# Patient Record
Sex: Male | Born: 1960 | Race: White | Hispanic: No | Marital: Married | Smoking: Former smoker
Health system: Southern US, Community
[De-identification: ages and names within clinical notes are randomized; demographics above are authoritative.]

## PROBLEM LIST (undated history)

## (undated) DIAGNOSIS — N179 Acute kidney failure, unspecified: Secondary | ICD-10-CM

## (undated) DIAGNOSIS — J449 Chronic obstructive pulmonary disease, unspecified: Secondary | ICD-10-CM

## (undated) DIAGNOSIS — F1910 Other psychoactive substance abuse, uncomplicated: Secondary | ICD-10-CM

## (undated) DIAGNOSIS — F419 Anxiety disorder, unspecified: Secondary | ICD-10-CM

## (undated) DIAGNOSIS — E785 Hyperlipidemia, unspecified: Secondary | ICD-10-CM

## (undated) DIAGNOSIS — M1990 Unspecified osteoarthritis, unspecified site: Secondary | ICD-10-CM

## (undated) DIAGNOSIS — F329 Major depressive disorder, single episode, unspecified: Secondary | ICD-10-CM

## (undated) DIAGNOSIS — F32A Depression, unspecified: Secondary | ICD-10-CM

## (undated) DIAGNOSIS — I10 Essential (primary) hypertension: Secondary | ICD-10-CM

## (undated) DIAGNOSIS — R52 Pain, unspecified: Secondary | ICD-10-CM

## (undated) HISTORY — DX: Hyperlipidemia, unspecified: E78.5

## (undated) HISTORY — PX: BACK SURGERY: SHX140

## (undated) HISTORY — PX: FRACTURE SURGERY: SHX138

## (undated) HISTORY — PX: SPINE SURGERY: SHX786

## (undated) HISTORY — PX: CERVICAL FUSION: SHX112

## (undated) HISTORY — PX: OTHER SURGICAL HISTORY: SHX169

## (undated) HISTORY — DX: Other psychoactive substance abuse, uncomplicated: F19.10

---

## 2007-04-08 ENCOUNTER — Encounter: Admission: RE | Admit: 2007-04-08 | Discharge: 2007-04-08 | Payer: Self-pay | Admitting: Family Medicine

## 2007-06-04 ENCOUNTER — Inpatient Hospital Stay (HOSPITAL_COMMUNITY): Admission: RE | Admit: 2007-06-04 | Discharge: 2007-06-05 | Payer: Self-pay | Admitting: Specialist

## 2007-10-19 ENCOUNTER — Encounter: Admission: RE | Admit: 2007-10-19 | Discharge: 2007-10-19 | Payer: Self-pay | Admitting: Family Medicine

## 2007-11-10 ENCOUNTER — Ambulatory Visit: Payer: Self-pay | Admitting: Internal Medicine

## 2007-11-10 DIAGNOSIS — J984 Other disorders of lung: Secondary | ICD-10-CM

## 2007-11-10 DIAGNOSIS — R079 Chest pain, unspecified: Secondary | ICD-10-CM

## 2007-12-13 ENCOUNTER — Encounter: Admission: RE | Admit: 2007-12-13 | Discharge: 2007-12-13 | Payer: Self-pay | Admitting: Specialist

## 2008-01-07 ENCOUNTER — Inpatient Hospital Stay (HOSPITAL_COMMUNITY): Admission: AD | Admit: 2008-01-07 | Discharge: 2008-01-08 | Payer: Self-pay | Admitting: Specialist

## 2008-03-16 ENCOUNTER — Ambulatory Visit: Payer: Self-pay | Admitting: *Deleted

## 2008-03-16 ENCOUNTER — Inpatient Hospital Stay (HOSPITAL_COMMUNITY): Admission: AD | Admit: 2008-03-16 | Discharge: 2008-03-19 | Payer: Self-pay | Admitting: *Deleted

## 2008-04-04 ENCOUNTER — Telehealth (INDEPENDENT_AMBULATORY_CARE_PROVIDER_SITE_OTHER): Payer: Self-pay | Admitting: *Deleted

## 2008-04-12 ENCOUNTER — Encounter (INDEPENDENT_AMBULATORY_CARE_PROVIDER_SITE_OTHER): Payer: Self-pay | Admitting: *Deleted

## 2008-04-12 ENCOUNTER — Encounter: Payer: Self-pay | Admitting: Internal Medicine

## 2008-04-24 ENCOUNTER — Ambulatory Visit: Payer: Self-pay | Admitting: Cardiology

## 2008-04-25 ENCOUNTER — Telehealth (INDEPENDENT_AMBULATORY_CARE_PROVIDER_SITE_OTHER): Payer: Self-pay | Admitting: *Deleted

## 2008-05-01 ENCOUNTER — Ambulatory Visit (HOSPITAL_COMMUNITY): Admission: RE | Admit: 2008-05-01 | Discharge: 2008-05-02 | Payer: Self-pay | Admitting: Neurological Surgery

## 2008-05-30 ENCOUNTER — Encounter: Admission: RE | Admit: 2008-05-30 | Discharge: 2008-05-30 | Payer: Self-pay | Admitting: Neurological Surgery

## 2008-07-24 ENCOUNTER — Encounter: Admission: RE | Admit: 2008-07-24 | Discharge: 2008-07-24 | Payer: Self-pay | Admitting: Neurological Surgery

## 2008-10-23 ENCOUNTER — Encounter: Admission: RE | Admit: 2008-10-23 | Discharge: 2008-10-23 | Payer: Self-pay | Admitting: Neurological Surgery

## 2009-04-04 ENCOUNTER — Telehealth (INDEPENDENT_AMBULATORY_CARE_PROVIDER_SITE_OTHER): Payer: Self-pay | Admitting: *Deleted

## 2009-04-09 ENCOUNTER — Encounter: Payer: Self-pay | Admitting: Internal Medicine

## 2010-03-18 ENCOUNTER — Ambulatory Visit: Payer: Self-pay | Admitting: Internal Medicine
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
## 2010-09-17 NOTE — Op Note (Signed)
Adrian Rivas, Adrian Rivas                ACCOUNT NO.:  [id number]   MEDICAL RECORD NO.:  [id number]          PATIENT TYPE:  OIB   LOCATION:  3006                         FACILITY:  [facility]   PHYSICIAN:  Tia Alert, MD     DATE OF BIRTH:  [birth year]   DATE OF PROCEDURE:  05/01/2008  DATE OF DISCHARGE:                               OPERATIVE REPORT   PREOPERATIVE DIAGNOSIS:  Cervical spondylosis C3-4 and C6-7 with neck  pain.   POSTOPERATIVE DIAGNOSIS:  Cervical spondylosis C3-4 and C6-7 with neck  pain.   PROCEDURES:  1. Decompressive anterior cervical diskectomy C3-4 and C6-7 through      separate incisions.  2. Anterior cervical arthrodesis C3-4 and C6-7 utilizing 7-mm      corticocancellous allograft.  3. Anterior cervical plating C3-4 and C6-7 utilizing 24-mm Atlantis      Venture plates both through separate incisions.   SURGEON:  Tia Alert, MD   ASSISTANT:  Reinaldo Meeker, MD   ANESTHESIA:  General general endotracheal.   COMPLICATIONS:  None apparent.   INDICATIONS FOR PROCEDURE:  Adrian Rivas is a very pleasant 50 year old  gentleman who presented with severe axial neck pain.  He had an  interscapular pain and occasional aching down the arms.  He had an MRI  and then a CT myelogram, which showed cervical spondylosis with disk  bulging and osteophytes at C3-4, C6-7.  We felt that it was reasonable  this was the cause of her axial neck pain and interscapular pain.  He  had tried medical management quite some time without significant relief  including epidural steroid injection as I recommended  __________ at C3-  4 and C6-7.  He understood the risks, benefits, expected outcome and  wished to proceed.  He understood that this should be done through  separate incisions.   DESCRIPTION OF PROCEDURE:  The patient was taken to operating room and  after induction of adequate generalized endotracheal anesthesia, he was  placed in the supine position on the operating room  table.  His right  anterior cervical region was prepped with DuraPrep and then draped in  the usual sterile fashion.  Local anesthesia 5 mL was injected, and we  started on C3-4, made a transverse incision on the C3-4 and then  dissected a plane medial to the sternocleidomastoid muscle and internal  carotid artery and lateral __________ esophagus to expose C3-4.  Intraoperative fluoroscopy confirmed my level and then we took down the  longus colli muscles and the shadow line retractor was placed under this  to expose C3-4.  The annulus was then incised and the initial diskectomy  was done with pituitary rongeurs and curved curettes __________ anterior  inferior endplate of C3 and then used the high-speed drill to drill the  endplates down to the level of the posterior longitudinal ligament.  In  the posterior spurs, there was a large posterior spur coming off to the  posterior body of C3.  This was all removed with 1 and a 2-mm Kerrison  punch while undercutting the vertebral bodies to decompress the  central  canal.  The posterior longitudinal ligament was opened and removed, and  as we undercut the bodies.  We dissected until we could see the takeoff  of the C4 nerve roots.  Then the dura was full all the way across.  We  palpated in a circumferential fashion with a nerve hook such that we  felt we had a good decompression both visually and with palpation.  We  then irrigated, we measured interspace to be a 7 mm to use a 7 mL  corticocancellous allograft and tapped this into position at C3-4.  We  then used a 24-mm Atlantis Venture plate placed.  Two 30-mm variable  angle screws into the bodies of C3-C4, and these locked in the plate by  locking mechanism within the plate.  We then irrigated this, left this  opened and went to C6-7, made a transverse incision and then opened the  platysma, then dissected plane medial to the sternocleidomastoid mastoid  muscle, internal carotid artery, and  extended laterally to  __________esophagus to expose C6-7.  Intraoperative fluoroscopy  confirmed our level and then we took down the longus colli muscles.  There was a large anterior osteophyte on the anterior part of C6-7.  This was removed with a Leksell rongeur and then the retractor was  placed.  The annulus was incised and the initial diskectomy was done  with pituitary rongeurs and curved curettes.  I then used the high-speed  drill to drill the endplates down to the level of the posterior  longitudinal ligament, which was opened with a nerve hook, and then  removed in a circumferential fashion while undercutting the bodies of C6-  C7.  The pedicles were palpable.  After marching along the C7 endplate  bilaterally and saw the takeoff of C7 nerve roots, decompressed the  nerve roots and decompressing the central canal.  Once we had this done,  the dura was full all the way across.  We palpated with a nerve hook in  a circumferential fashion to assure adequate decompression of both nerve  hooks and a central canal.  I then measured the interspace to be 7 mm.  I irrigated with saline solution containing bacitracin and used a 7-mm  corticocancellous allograft and tapped  it into position at C6-7.  I  then used another 24-mm plate with four 30-mm variable angle screws, and  he is again locked into the plate by the locking mechanism within the  plate.  I then irrigated both wounds with copious amounts of bacitracin  containing saline solution, dried all bleeding points with bipolar  cautery, and once the meticulous hemostasis was achieved in both wounds,  closed the platysma with 3-0 Vicryl, close the subcuticular tissue with  3-0 Vicryl, and closed the skin with Benzoin Steri-Strips.  The drapes  were removed.  Sterile dressing was applied.  The patient was awakened  from general anesthesia and transferred to recovery room in stable  condition.  At the end of the procedure all sponges,  needle, and  instrument counts were correct.      Tia Alert, MD  Electronically Signed     DSJ/MEDQ  D:  05/01/2008  T:  05/02/2008  Job:  [id number]

## 2010-09-17 NOTE — Op Note (Signed)
Adrian Rivas, Adrian Rivas                ACCOUNT NO.:  [id number]   MEDICAL RECORD NO.:  [id number]          PATIENT TYPE:  INP   LOCATION:  1605                         FACILITY:  [facility]   PHYSICIAN:  Jene Every, M.D.    DATE OF BIRTH:  May 23, 1960   DATE OF PROCEDURE:  06/03/2007  DATE OF DISCHARGE:  06/05/2007                               OPERATIVE REPORT   REDICTATION   PREOPERATIVE DIAGNOSES:  1. Spinal stenosis.  2. Herniated nucleus pulposus at L3-4, L4-5.   POSTOPERATIVE DIAGNOSIS:  1. Spinal stenosis.  2. Herniated nucleus pulposus at L3-4, L4-5.   PROCEDURES PERFORMED:  1. Decompression at L4-5 as well as L3-4.  2. Central laminectomy of L3.  3. Bilateral hemilaminotomies of L4-5.  4. Foraminotomies of L4 and L5 bilaterally.   ANESTHESIA:  General.   ASSISTANT:  Georges Lynch. Gioffre, M.D.   BRIEF HISTORY AND INDICATIONS:  This a 50 year old with right lower  extremity radiculopathy secondary to a disk herniation migrating  cephalad from L4-5, as well as chronic spinal stenosis, and multilevel  disk degeneration.  He was indicated for decompression due to acute L4-5  radiculopathy from his HNP with underlying stenosis.  It was indicated  that this would not effect his back pain, that may require fusion in the  future, as the acute incident is this disk herniation with neural  compression.  He had a positive neurotension sign.  He had weakness of  his quad and his EHL.  The risks and benefits were discussed including  bleeding, infection, damage to surrounding structures,  CSF leakage,  epidural fibrosis, adjacent segment disease, the need for fusion in the  future, anesthetic complications etc.   TECHNIQUE:  The patient was placed in the supination position after  having been inducted under general anesthesia and 2 grams Kefzol.  He  was placed prone on the [place] frame, and all bony prominences were  well padded.  Lumbar region prepped and draped in the usual  fashion.  A  2-inch spinal needle was utilized and localize the L4-5 interspace,  confirmed with x-ray.  Incision was made from the spinous process of L3  to below L5.  The subcutaneous tissue was dissected down and  electrocautery was used to achieve hemostasis.  Dorsolumbar fascia  identified, bilateral skin incision paraspinous muscle elevated from the  lamina of L3-4 and L4-5.  Confirmatory radiograph obtained with a  McCullough retractor and Penfields identifying the spinous process of L4  and the interlaminar space of L4-5 and L3-4.   Next a Leksell rongeur was utilized to remove the spinous process of L4  and partial of L5.  Due to the severe central spinal stenosis, we  elected to proceed centrally.  Following this, we performed  hemilaminotomies of caudad edge of L4 utilizing a two, followed by a 3  mm Kerrison.  This out laterally and then carrying the decompression  centrally.  We removed the entire lamina of L4 as there was severe  stenosis noted.  Ligamentum flavum hypertrophy and facet hypertrophy was  noted.   Following the removal of the  lamina of L4 there was hypertrophic  ligamentum at L3-4,  ligamentum flavum was removed to the caudad edge of  L3 bilaterally.  We used the operating microscope.  We protected the  neural elements. decompressed the lateral recesses to the medial border  of the pedicle of L4.  A large disk herniation paracentral to the right  was identified.  It was gently mobilized with the nerve hook, and then  retrieved with the micropituitary.  This decompressed the L4 and L5 root  on the right.  Following this, we examined the disk space at L4-5.  There was no residual disk herniation emanating from the disk space  itself.  There was some stenosis into the foramen of L4 and L5  bilaterally.  We performed foraminotomies bilaterally.  Bipolar  electrocautery was utilized to achieve hemostasis.   We removed the ligamentum flavum as well from L4-5, and  performed  hemilaminotomies of the cephalad edge of L5 bilaterally and  foraminotomies of L5.  Following this a hockey stick probe passed freely  through the foramens of L4 and L5, and cephalad to the pedicle of L3.  The thecal sac was reconstituted. It was pulsatile.  There is no CSF  leakage or active bleeding.  Disk space was checked on the right.  There  was no disk herniation and no residual disk herniation in the foramen  underneath the axilla of the root.  There was at least 1 cm of excursion  of the L4 and L5 roots medial to the pedicle without difficulty.   Next, wound copiously irrigated.  Bone wax placed in the cancellous  surfaces.  Thrombin-soaked Gelfoam was placed in laminotomy defect.  McCullough retractors removed.  Paraspinous muscle inspected with no  active bleeding.  Copious irrigation was performed.  I repaired the  dorsolumbar fascia with #1 Vicryl interrupted figure-of-eight sutures.  The subcutaneous tissue reapproximated with 2-0 Vicryl simple sutures.  The skin was reapproximated with staples.  The wound was dressed  sterilely.  He was placed supine on the hospital bed, extubated without  difficulty, and transported to recovery in satisfactory condition.   The patient tolerated the procedure well with no complications.   BLOOD LOSS:  100 mL.      Jene Every, M.D.  Electronically Signed     JB/MEDQ  D:  06/16/2007  T:  06/17/2007  Job:  [id number]
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
## 2011-01-24 LAB — COMPREHENSIVE METABOLIC PANEL
ALT: 31
Albumin: 3.7
Calcium: 9.2
Glucose, Bld: 112 — ABNORMAL HIGH
Sodium: 139
Total Protein: 6.2

## 2011-01-24 LAB — CBC
MCHC: 34.5
MCV: 94.4
RBC: 4.41
RDW: 14.4

## 2011-02-04 LAB — COMPREHENSIVE METABOLIC PANEL
BUN: 13
CO2: 29
Calcium: 9.2
Chloride: 106
Creatinine, Ser: 0.91
GFR calc non Af Amer: >60
Total Bilirubin: 0.7

## 2011-02-04 LAB — URINE DRUGS OF ABUSE SCREEN W ALC, ROUTINE (REF LAB)
Barbiturate Quant, Ur: NEGATIVE
Benzodiazepines.: NEGATIVE
Methadone: NEGATIVE
Phencyclidine (PCP): NEGATIVE
Propoxyphene: NEGATIVE

## 2011-02-04 LAB — URINALYSIS, ROUTINE W REFLEX MICROSCOPIC
Bilirubin Urine: NEGATIVE
Glucose, UA: NEGATIVE
Hgb urine dipstick: NEGATIVE
Nitrite: NEGATIVE
Specific Gravity, Urine: 1.02
pH: 6

## 2011-02-04 LAB — CBC
HCT: 43.8
Hemoglobin: 14.9
MCV: 95.9
RDW: 12.6
WBC: 5.7

## 2011-02-04 LAB — AMPHETAMINES URINE CONFIRMATION: Amphetamines: NEGATIVE

## 2011-02-04 LAB — TSH: TSH: 1.307

## 2011-02-05 LAB — COMPREHENSIVE METABOLIC PANEL
Alkaline Phosphatase: 74
BUN: 7
Glucose, Bld: 101 — ABNORMAL HIGH
Potassium: 4
Total Bilirubin: 1.5 — ABNORMAL HIGH
Total Protein: 6.6

## 2011-02-05 LAB — URINALYSIS, ROUTINE W REFLEX MICROSCOPIC
Bilirubin Urine: NEGATIVE
Glucose, UA: NEGATIVE
Hgb urine dipstick: NEGATIVE
Ketones, ur: NEGATIVE
Protein, ur: NEGATIVE

## 2011-02-05 LAB — CBC
HCT: 39.8
Hemoglobin: 15.1
MCHC: 33.6
MCV: 96.2
MCV: 97.4
Platelets: 198
RBC: 4.14 — ABNORMAL LOW
RBC: 4.62
WBC: 8.5
WBC: 8.7

## 2011-02-05 LAB — BASIC METABOLIC PANEL
BUN: 3 — ABNORMAL LOW
CO2: 30
Calcium: 8.5
Chloride: 106
Creatinine, Ser: 0.8
GFR calc Af Amer: >60
Glucose, Bld: 120 — ABNORMAL HIGH
Potassium: 3.9

## 2011-02-05 LAB — PROTIME-INR: Prothrombin Time: 11.9

## 2011-02-07 LAB — DIFFERENTIAL
Basophils Relative: 0 % (ref 0–1)
Eosinophils Absolute: 0.1 10*3/uL (ref 0.0–0.7)
Eosinophils Relative: 1 % (ref 0–5)
Monocytes Relative: 10 % (ref 3–12)
Neutrophils Relative %: 79 % — ABNORMAL HIGH (ref 43–77)

## 2011-02-07 LAB — CBC
MCHC: 32.8 g/dL (ref 30.0–36.0)
MCV: 95.7 fL (ref 78.0–100.0)
Platelets: 316 10*3/uL (ref 150–400)
RBC: 4.5 MIL/uL (ref 4.22–5.81)

## 2011-02-07 LAB — PROTIME-INR: INR: 0.9 (ref 0.00–1.49)

## 2011-02-07 LAB — BASIC METABOLIC PANEL
BUN: 11 mg/dL (ref 6–23)
CO2: 29 mEq/L (ref 19–32)
Chloride: 106 mEq/L (ref 96–112)
Creatinine, Ser: 0.87 mg/dL (ref 0.4–1.5)

## 2012-10-11 ENCOUNTER — Other Ambulatory Visit: Payer: Self-pay | Admitting: Orthopedic Surgery

## 2012-10-11 NOTE — Pre-Procedure Instructions (Signed)
Please enter orders in EPIC as patient has pre-op appointment on 10/13/2012 at 2 pm! Thank you!

## 2012-10-13 ENCOUNTER — Encounter (HOSPITAL_COMMUNITY)
Admission: RE | Admit: 2012-10-13 | Discharge: 2012-10-13 | Disposition: A | Discharge status: Home / Self Care | Payer: BC Managed Care – PPO | Source: Ambulatory Visit | Attending: Specialist | Admitting: Specialist

## 2012-10-13 ENCOUNTER — Ambulatory Visit (HOSPITAL_COMMUNITY)
Admission: RE | Admit: 2012-10-13 | Discharge: 2012-10-13 | Disposition: A | Discharge status: Home / Self Care | Payer: BC Managed Care – PPO | Source: Ambulatory Visit | Attending: Orthopedic Surgery | Admitting: Orthopedic Surgery

## 2012-10-13 ENCOUNTER — Encounter (HOSPITAL_COMMUNITY): Payer: Self-pay

## 2012-10-13 ENCOUNTER — Encounter (HOSPITAL_COMMUNITY): Payer: Self-pay | Admitting: Pharmacy Technician

## 2012-10-13 ENCOUNTER — Other Ambulatory Visit: Payer: Self-pay | Admitting: Orthopedic Surgery

## 2012-10-13 DIAGNOSIS — Z981 Arthrodesis status: Secondary | ICD-10-CM | POA: Insufficient documentation

## 2012-10-13 DIAGNOSIS — M51379 Other intervertebral disc degeneration, lumbosacral region without mention of lumbar back pain or lower extremity pain: Secondary | ICD-10-CM | POA: Insufficient documentation

## 2012-10-13 DIAGNOSIS — M5137 Other intervertebral disc degeneration, lumbosacral region: Secondary | ICD-10-CM | POA: Insufficient documentation

## 2012-10-13 DIAGNOSIS — M5380 Other specified dorsopathies, site unspecified: Secondary | ICD-10-CM | POA: Insufficient documentation

## 2012-10-13 DIAGNOSIS — Z01818 Encounter for other preprocedural examination: Secondary | ICD-10-CM | POA: Insufficient documentation

## 2012-10-13 DIAGNOSIS — Z01810 Encounter for preprocedural cardiovascular examination: Secondary | ICD-10-CM | POA: Insufficient documentation

## 2012-10-13 DIAGNOSIS — Z01812 Encounter for preprocedural laboratory examination: Secondary | ICD-10-CM | POA: Insufficient documentation

## 2012-10-13 DIAGNOSIS — M4120 Other idiopathic scoliosis, site unspecified: Secondary | ICD-10-CM | POA: Insufficient documentation

## 2012-10-13 DIAGNOSIS — J4489 Other specified chronic obstructive pulmonary disease: Secondary | ICD-10-CM | POA: Insufficient documentation

## 2012-10-13 DIAGNOSIS — J449 Chronic obstructive pulmonary disease, unspecified: Secondary | ICD-10-CM | POA: Insufficient documentation

## 2012-10-13 HISTORY — DX: Hyperlipidemia, unspecified: E78.5

## 2012-10-13 HISTORY — DX: Major depressive disorder, single episode, unspecified: F32.9

## 2012-10-13 HISTORY — DX: Anxiety disorder, unspecified: F41.9

## 2012-10-13 HISTORY — DX: Pain, unspecified: R52

## 2012-10-13 HISTORY — DX: Chronic obstructive pulmonary disease, unspecified: J44.9

## 2012-10-13 HISTORY — DX: Unspecified osteoarthritis, unspecified site: M19.90

## 2012-10-13 HISTORY — DX: Depression, unspecified: F32.A

## 2012-10-13 NOTE — Pre-Procedure Instructions (Signed)
PT GIVES HX OF COPD, OBESITY, DEPRESSION - NO RECENT EKG OR CXR - EKG AND CXR WERE DONE TODAY PREOP AT [facility] - ALONG WITH BACK XRAY THAT WAS ORDERED BY DR. Shelle Iron. PT HAD CBC, DIFF, CMET, THYROID PANEL, VIT D LEVEL - DONE 10/06/2012 AND RPORTS FAXED BY DR. NIEMEYER'S OFFICE AND ON PT'S CHART.  NO OTHER PREOP BLOOD WORK IS NEEDED.

## 2012-10-13 NOTE — Progress Notes (Signed)
10/13/12 1453  OBSTRUCTIVE SLEEP APNEA  Have you ever been diagnosed with sleep apnea through a sleep study? No  Do you snore loudly (loud enough to be heard through closed doors)?  1  Do you often feel tired, fatigued, or sleepy during the daytime? 0  Has anyone observed you stop breathing during your sleep? 1  Do you have, or are you being treated for high blood pressure? 0  BMI more than 35 kg/m2? 1  Age over 54 years old? 1  Neck circumference greater than 40 cm/18 inches? 0  Gender: 1  Obstructive Sleep Apnea Score 5  Score 4 or greater  Results sent to PCP

## 2012-10-13 NOTE — Patient Instructions (Signed)
YOUR SURGERY IS SCHEDULED AT [facility]  ON:   Wednesday  June 18  REPORT TO [place] [facility] AT:  6:30 AM      PHONE # FOR SHORT STAY IS [phone]     STOP PHENTERMINE AT LEAST 3 DAYS BEFORE YOUR SURGERY -- ROUTINE REQUEST BY ANESTHESIOLOGIST.  DO NOT EAT OR DRINK ANYTHING AFTER MIDNIGHT THE NIGHT BEFORE YOUR SURGERY.  YOU MAY BRUSH YOUR TEETH, RINSE OUT YOUR MOUTH--BUT NO WATER, NO FOOD, NO CHEWING GUM, NO MINTS, NO CANDIES, NO CHEWING TOBACCO.  PLEASE TAKE THE FOLLOWING MEDICATIONS THE AM OF YOUR SURGERY WITH A FEW SIPS OF WATER:  PRISTIQ, LAMICTAL, HYDROCODONE / ACETAMINOPHEN    DO NOT BRING VALUABLES, MONEY, CREDIT CARDS.  DO NOT WEAR JEWELRY, MAKE-UP, NAIL POLISH AND NO METAL PINS OR CLIPS IN YOUR HAIR. CONTACT LENS, DENTURES / PARTIALS, GLASSES SHOULD NOT BE WORN TO SURGERY AND IN MOST CASES-HEARING AIDS WILL NEED TO BE REMOVED.  BRING YOUR GLASSES CASE, ANY EQUIPMENT NEEDED FOR YOUR CONTACT LENS. FOR PATIENTS ADMITTED TO THE HOSPITAL--CHECK OUT TIME THE DAY OF DISCHARGE IS 11:00 AM.  ALL INPATIENT ROOMS ARE PRIVATE - WITH BATHROOM, TELEPHONE, TELEVISION AND WIFI INTERNET.                                 PLEASE READ OVER ANY  FACT SHEETS THAT YOU WERE GIVEN: MRSA INFORMATION, BLOOD TRANSFUSION INFORMATION, INCENTIVE SPIROMETER INFORMATION. FAILURE TO FOLLOW THESE INSTRUCTIONS MAY RESULT IN THE CANCELLATION OF YOUR SURGERY.   PATIENT SIGNATURE_________________________________

## 2012-10-13 NOTE — H&P (Signed)
Adrian Rivas is an 52 y.o. male.   Chief Complaint: back and B/L leg pain HPI: The patient is a 52 year old male who presents with 4 months of gradual onset LBP (left worse than right) and pain, numbness, stiffness, tightness and weakness in the left thigh, left lateral lower leg, right thigh and right lateral lower leg. The patient describes the pain as sharp. The patient describes the severity of their symptoms as severe and worsening. Symptoms are exacerbated by standing. Past treatment has included epidural injections (no significant relief).  Ronne Binning follows up and he has had over four months of pain into his legs and recently over the past month inability to walk without a walker. He was seen by Dr. Ethelene Hal. He had an MRI performed of his lumbar spine. He has severe stenosis above the level of his decompression. Stenosis is now at L2-3 into L1-2 with disc herniations noted. No deformity of the conus. He reports pain into the anterior thighs. Difficulty walking. No fevers, chills, or change in bowel or bladder function. He has tried an epidural without relief. He has been doing his exercises without relief.  Hx prior lumbar decompression L3-4 and L4-5 by Dr. Shelle Iron in 05/2007.   No past medical history on file.  No past surgical history on file.  No family history on file. Social History:  has no tobacco, alcohol, and drug history on file.  Allergies: No Known Allergies   (Not in a hospital admission)  No results found for this or any previous visit (from the past 48 hour(s)). No results found.  Review of Systems  Constitutional: Negative.   HENT: Negative.   Eyes: Negative.   Respiratory: Negative.   Cardiovascular: Negative.   Gastrointestinal: Negative.   Genitourinary: Negative.   Musculoskeletal: Positive for back pain.  Skin: Negative.   Neurological: Positive for sensory change and focal weakness.  Endo/Heme/Allergies: Negative.   Psychiatric/Behavioral:  Negative.     There were no vitals taken for this visit. Physical Exam  Constitutional: He is oriented to person, place, and time. He appears well-developed and well-nourished. He appears distressed.  HENT:  Head: Normocephalic.  Eyes: Conjunctivae and EOM are normal. Pupils are equal, round, and reactive to light.  Neck: Normal range of motion. Neck supple.  Cardiovascular: Normal rate and regular rhythm.   Respiratory: Effort normal and breath sounds normal.  GI: Soft. Bowel sounds are normal.  Musculoskeletal:  On exam he is sitting on a rolling wheelchair. He has some slight hip flexor weakness as well as quad weakness. When he stands he walks with a forward flexed gait. On straight standing he has pain into the anterior lateral aspect of both thighs. Peripheral pulses are intact. No DVT. He has reduced extension secondary to pain, relieved with forward flexion. Nontender over the thoracic spine. Pelvis is stable. No flank pain with percussion. No instability in the hips, knees, and ankles.   Neurological: He is alert and oriented to person, place, and time.  Skin: Skin is warm and dry.  Psychiatric: He has a normal mood and affect.    MRI Lspine with right sided central extrusion L1-2 displacing L1 root; severe stenosis L2-3; B/L foraminal narrowing L3-4, L4-5, L5-S1.  Three view radiographs demonstrate no instability in the adjacent segments. Previous decompression at 3-4 and 4-5.  Assessment/Plan Stenosis L1-2, L2-3  Severe spinal stenosis at 2-3 extending into 1-2, history of lumbar decompression at 3-4 and 4-5. The patient with significant neurogenic claudication requiring  a walker, weakness in his hip flexors and quads despite rest, activity modifications, and injections.  I had an extensive discussion of the risks and benefits of lumbar decompression with the patient including bleeding, infection, damage to neurovascular structures, epidural fibrosis, CSF leakage  requiring repair. We also discussed increase in pain, adjacent segment disease, recurrent disc herniation, need for future surgery including repeat decompression and/or fusion. We also discussed risks of postoperative hematoma, paralysis, anesthetic complications including DVT, PE, death, cardiopulmonary dysfunction. In addition, the perioperative and postoperative courses were discussed in detail including the rehabilitative time and return to functional activity and work. I provided the patient with an illustrated handout and utilized the appropriate surgical models.  In the interim continue use of his walker, pain medicine, strategies to avoid reinjury. Preoperative clearance will be appreciated by his medical physician. Appreciate the kind referral.  Plan microlumbar decompression L1-2, L2-3, possible L3-4  Shequita Peplinski M. for Dr. Shelle Iron 10/13/2012, 12:42 PM

## 2012-10-20 ENCOUNTER — Encounter (HOSPITAL_COMMUNITY): Payer: Self-pay

## 2012-10-20 ENCOUNTER — Encounter (HOSPITAL_COMMUNITY)
Admission: RE | Disposition: A | Discharge status: Home / Self Care | Payer: Self-pay | Source: Ambulatory Visit | Attending: Specialist

## 2012-10-20 ENCOUNTER — Inpatient Hospital Stay (HOSPITAL_COMMUNITY)
Admission: RE | Admit: 2012-10-20 | Discharge: 2012-10-22 | DRG: 757 | Disposition: A | Discharge status: Home / Self Care | Payer: BC Managed Care – PPO | Source: Ambulatory Visit | Attending: Specialist | Admitting: Specialist

## 2012-10-20 ENCOUNTER — Inpatient Hospital Stay (HOSPITAL_COMMUNITY): Payer: BC Managed Care – PPO

## 2012-10-20 ENCOUNTER — Inpatient Hospital Stay (HOSPITAL_COMMUNITY): Payer: BC Managed Care – PPO | Admitting: Anesthesiology

## 2012-10-20 ENCOUNTER — Encounter (HOSPITAL_COMMUNITY): Payer: Self-pay | Admitting: Anesthesiology

## 2012-10-20 DIAGNOSIS — M5126 Other intervertebral disc displacement, lumbar region: Secondary | ICD-10-CM | POA: Diagnosis present

## 2012-10-20 DIAGNOSIS — E785 Hyperlipidemia, unspecified: Secondary | ICD-10-CM | POA: Diagnosis present

## 2012-10-20 DIAGNOSIS — F329 Major depressive disorder, single episode, unspecified: Secondary | ICD-10-CM | POA: Diagnosis present

## 2012-10-20 DIAGNOSIS — J449 Chronic obstructive pulmonary disease, unspecified: Secondary | ICD-10-CM | POA: Diagnosis present

## 2012-10-20 DIAGNOSIS — F3289 Other specified depressive episodes: Secondary | ICD-10-CM | POA: Diagnosis present

## 2012-10-20 DIAGNOSIS — M48061 Spinal stenosis, lumbar region without neurogenic claudication: Secondary | ICD-10-CM

## 2012-10-20 DIAGNOSIS — Z79899 Other long term (current) drug therapy: Secondary | ICD-10-CM

## 2012-10-20 DIAGNOSIS — M48062 Spinal stenosis, lumbar region with neurogenic claudication: Principal | ICD-10-CM | POA: Diagnosis present

## 2012-10-20 DIAGNOSIS — F411 Generalized anxiety disorder: Secondary | ICD-10-CM | POA: Diagnosis present

## 2012-10-20 DIAGNOSIS — J4489 Other specified chronic obstructive pulmonary disease: Secondary | ICD-10-CM | POA: Diagnosis present

## 2012-10-20 HISTORY — PX: LUMBAR LAMINECTOMY/DECOMPRESSION MICRODISCECTOMY: SHX5026

## 2012-10-20 SURGERY — LUMBAR LAMINECTOMY/DECOMPRESSION MICRODISCECTOMY 3 LEVELS
Anesthesia: General | Site: Back | Wound class: Clean

## 2012-10-20 MED ORDER — SODIUM CHLORIDE 0.45 % IV SOLN
INTRAVENOUS | Status: DC
  Administered 2012-10-20 (×2): via INTRAVENOUS

## 2012-10-20 MED ORDER — METHOCARBAMOL 500 MG PO TABS: 500.0000 mg | ORAL_TABLET | Freq: Four times a day (QID) | ORAL | Status: DC

## 2012-10-20 MED ORDER — CEFAZOLIN SODIUM-DEXTROSE 2-3 GM-% IV SOLR
2.0000 g | Freq: Three times a day (TID) | INTRAVENOUS | Status: AC
  Administered 2012-10-20 (×2): 2 g via INTRAVENOUS
  Filled 2012-10-20 (×2): qty 50

## 2012-10-20 MED ORDER — HYDROMORPHONE HCL PF 1 MG/ML IJ SOLN
INTRAMUSCULAR | Status: DC | PRN
  Administered 2012-10-20 (×2): 0.5 mg via INTRAVENOUS
  Administered 2012-10-20: 1 mg via INTRAVENOUS

## 2012-10-20 MED ORDER — BUPIVACAINE-EPINEPHRINE 0.5% -1:200000 IJ SOLN
INTRAMUSCULAR | Status: DC | PRN
  Administered 2012-10-20: 10 mL

## 2012-10-20 MED ORDER — LIDOCAINE HCL (CARDIAC) 20 MG/ML IV SOLN
INTRAVENOUS | Status: DC | PRN
  Administered 2012-10-20: 80 mg via INTRAVENOUS

## 2012-10-20 MED ORDER — MAGNESIUM CITRATE PO SOLN: 1.0000 | Freq: Once | ORAL | Status: AC | PRN

## 2012-10-20 MED ORDER — GLYCOPYRROLATE 0.2 MG/ML IJ SOLN
INTRAMUSCULAR | Status: DC | PRN
  Administered 2012-10-20: 0.6 mg via INTRAVENOUS

## 2012-10-20 MED ORDER — PROPOFOL 10 MG/ML IV BOLUS
INTRAVENOUS | Status: DC | PRN
  Administered 2012-10-20: 200 mg via INTRAVENOUS

## 2012-10-20 MED ORDER — PHENTERMINE HCL 37.5 MG PO TABS: 37.5000 mg | ORAL_TABLET | Freq: Every day | ORAL | Status: DC

## 2012-10-20 MED ORDER — SODIUM CHLORIDE 0.9 % IJ SOLN: 3.0000 mL | INTRAMUSCULAR | Status: DC | PRN

## 2012-10-20 MED ORDER — ARIPIPRAZOLE 10 MG PO TABS
10.0000 mg | ORAL_TABLET | Freq: Every day | ORAL | Status: DC
Start: 2012-10-20 — End: 2012-10-22
  Administered 2012-10-20 – 2012-10-21 (×2): 10 mg via ORAL
  Filled 2012-10-20 (×3): qty 1

## 2012-10-20 MED ORDER — HYDROMORPHONE HCL PF 1 MG/ML IJ SOLN
0.2500 mg | INTRAMUSCULAR | Status: DC | PRN
  Administered 2012-10-20: 0.5 mg via INTRAVENOUS

## 2012-10-20 MED ORDER — NEOSTIGMINE METHYLSULFATE 1 MG/ML IJ SOLN
INTRAMUSCULAR | Status: DC | PRN
  Administered 2012-10-20: 4 mg via INTRAVENOUS

## 2012-10-20 MED ORDER — ACETAMINOPHEN 650 MG RE SUPP: 650.0000 mg | RECTAL | Status: DC | PRN

## 2012-10-20 MED ORDER — ACETAMINOPHEN 10 MG/ML IV SOLN
INTRAVENOUS | Status: DC | PRN
  Administered 2012-10-20: 1000 mg via INTRAVENOUS

## 2012-10-20 MED ORDER — DOCUSATE SODIUM 100 MG PO CAPS
100.0000 mg | ORAL_CAPSULE | Freq: Two times a day (BID) | ORAL | Status: DC
  Administered 2012-10-20 – 2012-10-22 (×4): 100 mg via ORAL

## 2012-10-20 MED ORDER — THROMBIN 5000 UNITS EX SOLR
CUTANEOUS | Status: DC | PRN
  Administered 2012-10-20: 10000 [IU] via TOPICAL

## 2012-10-20 MED ORDER — FENTANYL CITRATE 0.05 MG/ML IJ SOLN
INTRAMUSCULAR | Status: DC | PRN
  Administered 2012-10-20: 50 ug via INTRAVENOUS
  Administered 2012-10-20 (×2): 100 ug via INTRAVENOUS

## 2012-10-20 MED ORDER — METHOCARBAMOL 500 MG PO TABS
500.0000 mg | ORAL_TABLET | Freq: Four times a day (QID) | ORAL | Status: DC | PRN
  Administered 2012-10-20 – 2012-10-22 (×4): 500 mg via ORAL
  Filled 2012-10-20 (×4): qty 1

## 2012-10-20 MED ORDER — SODIUM CHLORIDE 0.9 % IJ SOLN
3.0000 mL | Freq: Two times a day (BID) | INTRAMUSCULAR | Status: DC
  Administered 2012-10-20: 3 mL via INTRAVENOUS
  Administered 2012-10-21: 10 mL via INTRAVENOUS
  Administered 2012-10-21: 3 mL via INTRAVENOUS

## 2012-10-20 MED ORDER — SODIUM CHLORIDE 0.9 % IR SOLN
Status: DC | PRN
  Administered 2012-10-20: 09:00:00

## 2012-10-20 MED ORDER — LACTATED RINGERS IV SOLN
INTRAVENOUS | Status: DC | PRN
  Administered 2012-10-20 (×2): via INTRAVENOUS

## 2012-10-20 MED ORDER — OXYCODONE-ACETAMINOPHEN 7.5-325 MG PO TABS: 1.0000 | ORAL_TABLET | ORAL | Status: DC | PRN

## 2012-10-20 MED ORDER — MIDAZOLAM HCL 5 MG/5ML IJ SOLN
INTRAMUSCULAR | Status: DC | PRN
  Administered 2012-10-20: 2 mg via INTRAVENOUS

## 2012-10-20 MED ORDER — ROCURONIUM BROMIDE 100 MG/10ML IV SOLN
INTRAVENOUS | Status: DC | PRN
  Administered 2012-10-20: 5 mg via INTRAVENOUS
  Administered 2012-10-20: 50 mg via INTRAVENOUS
  Administered 2012-10-20 (×2): 5 mg via INTRAVENOUS
  Administered 2012-10-20 (×2): 10 mg via INTRAVENOUS

## 2012-10-20 MED ORDER — VENLAFAXINE HCL ER 150 MG PO CP24
150.0000 mg | ORAL_CAPSULE | Freq: Every day | ORAL | Status: DC
  Administered 2012-10-21 – 2012-10-22 (×2): 150 mg via ORAL
  Filled 2012-10-20 (×3): qty 1

## 2012-10-20 MED ORDER — BISACODYL 10 MG RE SUPP: 10.0000 mg | Freq: Every day | RECTAL | Status: DC | PRN

## 2012-10-20 MED ORDER — ZOLPIDEM TARTRATE 10 MG PO TABS
10.0000 mg | ORAL_TABLET | Freq: Every evening | ORAL | Status: DC | PRN
  Administered 2012-10-21: 10 mg via ORAL
  Filled 2012-10-20: qty 1

## 2012-10-20 MED ORDER — SENNOSIDES-DOCUSATE SODIUM 8.6-50 MG PO TABS
1.0000 | ORAL_TABLET | Freq: Every evening | ORAL | Status: DC | PRN
  Filled 2012-10-20: qty 1

## 2012-10-20 MED ORDER — LACTATED RINGERS IV SOLN: INTRAVENOUS | Status: DC

## 2012-10-20 MED ORDER — HEMOSTATIC AGENTS (NO CHARGE) OPTIME
TOPICAL | Status: DC | PRN
  Administered 2012-10-20: 1 via TOPICAL

## 2012-10-20 MED ORDER — ONDANSETRON HCL 4 MG/2ML IJ SOLN
INTRAMUSCULAR | Status: DC | PRN
  Administered 2012-10-20: 4 mg via INTRAVENOUS

## 2012-10-20 MED ORDER — LABETALOL HCL 5 MG/ML IV SOLN
INTRAVENOUS | Status: DC | PRN
  Administered 2012-10-20: 5 mg via INTRAVENOUS

## 2012-10-20 MED ORDER — ACETAMINOPHEN 325 MG PO TABS: 650.0000 mg | ORAL_TABLET | ORAL | Status: DC | PRN

## 2012-10-20 MED ORDER — ALUM & MAG HYDROXIDE-SIMETH 200-200-20 MG/5ML PO SUSP: 30.0000 mL | Freq: Four times a day (QID) | ORAL | Status: DC | PRN

## 2012-10-20 MED ORDER — OXYCODONE-ACETAMINOPHEN 5-325 MG PO TABS
1.0000 | ORAL_TABLET | ORAL | Status: DC | PRN
  Administered 2012-10-20: 2 via ORAL
  Filled 2012-10-20: qty 2

## 2012-10-20 MED ORDER — FLUTICASONE PROPIONATE 50 MCG/ACT NA SUSP
2.0000 | Freq: Every day | NASAL | Status: DC
  Administered 2012-10-20 – 2012-10-21 (×2): 2 via NASAL
  Filled 2012-10-20: qty 16

## 2012-10-20 MED ORDER — SODIUM CHLORIDE 0.9 % IV SOLN: 250.0000 mL | INTRAVENOUS | Status: DC

## 2012-10-20 MED ORDER — CEFAZOLIN SODIUM-DEXTROSE 2-3 GM-% IV SOLR
2.0000 g | INTRAVENOUS | Status: AC
  Administered 2012-10-20: 2 g via INTRAVENOUS

## 2012-10-20 MED ORDER — PHENOL 1.4 % MT LIQD: 1.0000 | OROMUCOSAL | Status: DC | PRN

## 2012-10-20 MED ORDER — HYDROCODONE-ACETAMINOPHEN 5-325 MG PO TABS
1.0000 | ORAL_TABLET | ORAL | Status: DC | PRN
  Administered 2012-10-20 – 2012-10-22 (×9): 2 via ORAL
  Filled 2012-10-20 (×9): qty 2

## 2012-10-20 MED ORDER — DEXTROSE 5 % IV SOLN
500.0000 mg | Freq: Four times a day (QID) | INTRAVENOUS | Status: DC | PRN
  Filled 2012-10-20: qty 5

## 2012-10-20 MED ORDER — PHENYLEPHRINE HCL 10 MG/ML IJ SOLN
INTRAMUSCULAR | Status: DC | PRN
  Administered 2012-10-20: 80 ug via INTRAVENOUS
  Administered 2012-10-20 (×3): 40 ug via INTRAVENOUS

## 2012-10-20 MED ORDER — PHENYLEPHRINE HCL 10 MG/ML IJ SOLN
10.0000 mg | INTRAMUSCULAR | Status: DC | PRN
  Administered 2012-10-20: 10 ug/min via INTRAVENOUS

## 2012-10-20 MED ORDER — LAMOTRIGINE 200 MG PO TABS
200.0000 mg | ORAL_TABLET | Freq: Every morning | ORAL | Status: DC
  Administered 2012-10-21 – 2012-10-22 (×2): 200 mg via ORAL
  Filled 2012-10-20 (×2): qty 1

## 2012-10-20 MED ORDER — CHLORHEXIDINE GLUCONATE 4 % EX LIQD
60.0000 mL | Freq: Once | CUTANEOUS | Status: DC
  Filled 2012-10-20: qty 60

## 2012-10-20 MED ORDER — ONDANSETRON HCL 4 MG/2ML IJ SOLN
4.0000 mg | INTRAMUSCULAR | Status: DC | PRN
  Administered 2012-10-20: 4 mg via INTRAVENOUS
  Filled 2012-10-20: qty 2

## 2012-10-20 MED ORDER — HYDROMORPHONE HCL PF 1 MG/ML IJ SOLN
0.5000 mg | INTRAMUSCULAR | Status: DC | PRN
  Administered 2012-10-20 – 2012-10-21 (×5): 1 mg via INTRAVENOUS
  Filled 2012-10-20 (×5): qty 1

## 2012-10-20 MED ORDER — MENTHOL 3 MG MT LOZG: 1.0000 | LOZENGE | OROMUCOSAL | Status: DC | PRN

## 2012-10-20 SURGICAL SUPPLY — 54 items
BAG SPEC THK2 15X12 ZIP CLS (MISCELLANEOUS) ×1
BAG ZIPLOCK 12X15 (MISCELLANEOUS) ×2 IMPLANT
CLEANER TIP ELECTROSURG 2X2 (MISCELLANEOUS) ×2 IMPLANT
CLOTH 2% CHLOROHEXIDINE 3PK (PERSONAL CARE ITEMS) ×2 IMPLANT
CLOTH BEACON ORANGE TIMEOUT ST (SAFETY) ×2 IMPLANT
DECANTER SPIKE VIAL GLASS SM (MISCELLANEOUS) ×1 IMPLANT
DRAPE MICROSCOPE LEICA (MISCELLANEOUS) ×2 IMPLANT
DRAPE POUCH INSTRU U-SHP 10X18 (DRAPES) ×2 IMPLANT
DRAPE SURG 17X11 SM STRL (DRAPES) ×2 IMPLANT
DRSG AQUACEL AG ADV 3.5X 4 (GAUZE/BANDAGES/DRESSINGS) ×4 IMPLANT
DRSG AQUACEL AG ADV 3.5X 6 (GAUZE/BANDAGES/DRESSINGS) ×3 IMPLANT
DRSG EMULSION OIL 3X3 NADH (GAUZE/BANDAGES/DRESSINGS) IMPLANT
DRSG PAD ABDOMINAL 8X10 ST (GAUZE/BANDAGES/DRESSINGS) ×1 IMPLANT
DRSG TELFA 4X5 ISLAND ADH (GAUZE/BANDAGES/DRESSINGS) IMPLANT
DURAPREP 26ML APPLICATOR (WOUND CARE) ×2 IMPLANT
DURASEAL SPINE SEALANT 3ML (MISCELLANEOUS) IMPLANT
ELECT REM PT RETURN 9FT ADLT (ELECTROSURGICAL) ×2
ELECTRODE REM PT RTRN 9FT ADLT (ELECTROSURGICAL) ×1 IMPLANT
GLOVE BIOGEL PI IND STRL 7.5 (GLOVE) ×1 IMPLANT
GLOVE BIOGEL PI IND STRL 8 (GLOVE) ×1 IMPLANT
GLOVE BIOGEL PI INDICATOR 7.5 (GLOVE) ×1
GLOVE BIOGEL PI INDICATOR 8 (GLOVE) ×1
GLOVE SURG SS PI 7.5 STRL IVOR (GLOVE) ×2 IMPLANT
GLOVE SURG SS PI 8.0 STRL IVOR (GLOVE) ×4 IMPLANT
GOWN PREVENTION PLUS LG XLONG (DISPOSABLE) ×2 IMPLANT
GOWN STRL REIN XL XLG (GOWN DISPOSABLE) ×4 IMPLANT
IV CATH 14GX2 1/4 (CATHETERS) ×2 IMPLANT
KIT BASIN OR (CUSTOM PROCEDURE TRAY) ×2 IMPLANT
KIT POSITIONING SURG ANDREWS (MISCELLANEOUS) ×2 IMPLANT
MANIFOLD NEPTUNE II (INSTRUMENTS) ×2 IMPLANT
NDL SPNL 18GX3.5 QUINCKE PK (NEEDLE) ×3 IMPLANT
NEEDLE SPNL 18GX3.5 QUINCKE PK (NEEDLE) ×4 IMPLANT
PATTIES SURGICAL .5 X.5 (GAUZE/BANDAGES/DRESSINGS) ×1 IMPLANT
PATTIES SURGICAL .75X.75 (GAUZE/BANDAGES/DRESSINGS) IMPLANT
PATTIES SURGICAL 1X1 (DISPOSABLE) IMPLANT
SPONGE GAUZE 4X4 12PLY (GAUZE/BANDAGES/DRESSINGS) ×1 IMPLANT
SPONGE LAP 4X18 X RAY DECT (DISPOSABLE) ×1 IMPLANT
SPONGE SURGIFOAM ABS GEL 100 (HEMOSTASIS) ×2 IMPLANT
STAPLER VISISTAT (STAPLE) IMPLANT
STRIP CLOSURE SKIN 1/2X4 (GAUZE/BANDAGES/DRESSINGS) IMPLANT
SUT PROLENE 3 0 PS 2 (SUTURE) IMPLANT
SUT VIC AB 0 CT1 27 (SUTURE)
SUT VIC AB 0 CT1 27XBRD ANTBC (SUTURE) IMPLANT
SUT VIC AB 1 CT1 27 (SUTURE) ×4
SUT VIC AB 1 CT1 27XBRD ANTBC (SUTURE) ×2 IMPLANT
SUT VIC AB 1-0 CT2 27 (SUTURE) ×2 IMPLANT
SUT VIC AB 2-0 CT1 27 (SUTURE) ×4
SUT VIC AB 2-0 CT1 TAPERPNT 27 (SUTURE) ×1 IMPLANT
SUT VICRYL 0 UR6 27IN ABS (SUTURE) IMPLANT
SYR CONTROL 10ML LL (SYRINGE) ×2 IMPLANT
TAPE CLOTH SURG 6X10 WHT LF (GAUZE/BANDAGES/DRESSINGS) ×1 IMPLANT
TOWEL OR 17X26 10 PK STRL BLUE (TOWEL DISPOSABLE) ×2 IMPLANT
TRAY LAMINECTOMY (CUSTOM PROCEDURE TRAY) ×2 IMPLANT
YANKAUER SUCT BULB TIP NO VENT (SUCTIONS) ×2 IMPLANT

## 2012-10-20 NOTE — Preoperative (Signed)
Beta Blockers   Reason not to administer Beta Blockers:Not Applicable 

## 2012-10-20 NOTE — Transfer of Care (Signed)
Immediate Anesthesia Transfer of Care Note  Patient: Adrian Rivas  Procedure(s) Performed: Procedure(s): DECOMPRESSION L2-L3, L1-L2 (N/A)  Patient Location: PACU  Anesthesia Type:General  Level of Consciousness: awake, alert , oriented and patient cooperative  Airway & Oxygen Therapy: Patient Spontanous Breathing and Patient connected to face mask oxygen  Post-op Assessment: Report given to PACU RN and Post -op Vital signs reviewed and stable  Post vital signs: Reviewed and stable  Complications: No apparent anesthesia complications

## 2012-10-20 NOTE — Anesthesia Postprocedure Evaluation (Signed)
  Anesthesia Post-op Note  Patient: Adrian Rivas  Procedure(s) Performed: Procedure(s) (LRB): DECOMPRESSION L2-L3, L1-L2 (N/A)  Patient Location: PACU  Anesthesia Type: General  Level of Consciousness: awake and alert   Airway and Oxygen Therapy: Patient Spontanous Breathing  Post-op Pain: mild  Post-op Assessment: Post-op Vital signs reviewed, Patient's Cardiovascular Status Stable, Respiratory Function Stable, Patent Airway and No signs of Nausea or vomiting  Last Vitals:  Filed Vitals:   10/20/12 1200  BP: 112/56  Pulse: 97  Temp:   Resp: 9    Post-op Vital Signs: stable   Complications: No apparent anesthesia complications

## 2012-10-20 NOTE — Op Note (Signed)
Adrian Rivas, Adrian Rivas                ACCOUNT NO.:  [id number]  MEDICAL RECORD NO.:  [id number]  LOCATION:  1610                         FACILITY:  [facility]  PHYSICIAN:  Jene Every, M.D.    DATE OF BIRTH:  May 22, 1960  DATE OF PROCEDURE:  10/20/2012 DATE OF DISCHARGE:                              OPERATIVE REPORT   PREOPERATIVE DIAGNOSIS:  Spinal stenosis, HNP at L1-2, L2-3, L3-4.  POSTOPERATIVE DIAGNOSIS:  Spinal stenosis, HNP at L1-2, L2-3, L3-4.  PROCEDURE PERFORMED: 1. Lumbar decompression L1-2 and L2-3 with bilateral foraminotomies of     L2, L3, L1. 2. Redo decompression at L3-4.  ANESTHESIA:  General.  ASSISTANT:  Lanna Poche, PA.  HISTORY:  A 52 year old male with neurogenic claudication secondary to spinal stenosis, history of decompression L3-4 and L4-5.  He has multilevel disk degeneration, neural foraminal stenosis, and severe stenosis particularly at L2-3 due to disk herniation and multifactorial stenosis.  He was indicated for decompression at L2-3 also he had stenosis at L1-2, and the stenosis extending from L2-3 down the previous decompressed region of L3-4.  He has predominantly left as well as right lower extremity radicular pain,  neural tension signs, EHL weakness, quad weakness, as well as hip flexor weakness.  He had failed conservative treatment.  Tried injections, activity modification, he is unable to walk,  basically he has sedentary type position.  We discussed the lumbar decompression, at removing the spinous processes of L1-2 and the neural arch of L2, decompressing L1-2 and L2-3 possibly extending down into the cephalad region of L3-4 which is the area of previous decompression.  The risks and benefits discussed including bleeding, infection, damage to neurovascular structure, DVT, PE, anesthetic complications.  No change in symptoms, worsening symptoms, need for fusion in future.  TECHNIQUE:  With the patient in supine position, after  induction of adequate general anesthesia, 2 g of Kefzol, placed prone on the [place] frame.  All bony prominences were well padded.  Lumbar region was prepped, draped in the usual sterile fashion.  Surgical incision was made from the palpated the spinous above the spinous process of L1-L4, subcutaneous tissue was dissected with Cardiolite to achieve hemostasis. Dorsolumbar fascia identified, divided in line with skin incision. Paraspinous muscle elevated from lamina of L1-2, L2-3, L3-4.  Two McCullough retractors were placed.  Operating microscope was draped and brought in the surgical field, confirmed the spinous processes of L1 and L2.  These were removed with Leksell rongeur.  There was residual lamina of L3 also noted distally.  We first entered L1-2, the previously unentered interspace.  We detached ligamentum flavum from the cephalad edge of L2 with a straight curette and from the caudad edge of  L1. Using 2 mm Kerrison bilaterally, removing the neural arch of L2.  We decompressed the lateral recess to the medial border of the pedicle bilaterally.  We extended it down to remove the lamina of L2, severe stenosis was noted.  Here multifactorial disk herniation with ligamentum flavum hypertrophy.  Meticulously decompressed lateral recess to the medial border of the pedicle.  Removing a small portion of the medial aspect of the facet preserving the majority of the facets bilaterally. We  continued the dissection distally.  After removing the lamina of L2 and decompressing the lateral recesses at L2-3, distal to that there was severe and continued ligamentum flavum and hypertrophy.  We then at L2-3 decompressed and removed the ligamentum flavum bilaterally.  Here severe stenosis was noted bilaterally.  We encountered the residual neural arch of L3, Skeletonizing that neural arch.  There was severe stenosis extending down to the neural arch of L3.  We removed the ligamentum flavum from  that interspace and then removed the remaining neural arch of L3 as there was significant in flexion into the thecal sac, at this point, beneath that neural arch as well.  This extended down to the space of L3-4.  We performed foraminotomies of L3 bilaterally L2, and extending up into L1 bilaterally.  The disk herniation on the left L2-3, we made an annulotomy and removed disk material from disk space.  Some disk material was removed and predominantly it was hard disk as it was at L1-2.  Copiously irrigated the disk space.  Bipolar electrocautery was utilized to achieve hemostasis.  Bone wax placed on the cancellous surfaces.  Irrigated it throughout with no evidence of CSF leakage or active bleeding.  Obtained confirmatory radiographs demonstrating the cephalad extent of the decompression in the caudad extent.  A [place] retractor probe passed freely up the foramen once 2 and 3 bilaterally as well as down in the lateral recess of L3-4.  He had extensive epidural fibrosis noted just distal to lamina of L3.  There was good restoration of the thecal sac, however, it was felt that the majority of the stenosis was noted at L2-3 and underneath the lamina of the residual lamina of L3 and cephalad beneath the lamina of neural arch L2.  It was felt removing both neural arches of L2 and L3 decompressed the entire area of stenosis.  Again, no CSF leakage or active bleeding.  Copiously irrigated the wound.  Thrombin-soaked Gelfoam was placed in laminotomy defect.  Removed McCullough retractors. Paraspinous muscles inspected, no evidence of active bleeding was noted.  The dorsolumbar fascia with 1 Vicryl interrupted figure-of-eight, subcutaneous 2-0, skin staples. Wound was dressed sterilely.  Placed supine on hospital bed, extubated without difficulty, and transported to recovery in satisfactory condition.  The patient tolerated the procedure well.  No complications.  Assistant was Lanna Poche, [place].  Blood loss 150 mL.     Jene Every, M.D.     Cordelia Pen  D:  10/20/2012  T:  10/20/2012  Job:  [id number]

## 2012-10-20 NOTE — Progress Notes (Signed)
Utilization review completed.  

## 2012-10-20 NOTE — Interval H&P Note (Signed)
History and Physical Interval Note:  10/20/2012 8:16 AM  Adrian Rivas  has presented today for surgery, with the diagnosis of stenosis lumbar   The various methods of treatment have been discussed with the patient and family. After consideration of risks, benefits and other options for treatment, the patient has consented to  Procedure(s): DECOMPRESSION L2-L3, L1-L2 POSSIBLE L3-L4  (3 LEVELS) (N/A) as a surgical intervention .  The patient's history has been reviewed, patient examined, no change in status, stable for surgery.  I have reviewed the patient's chart and labs.  Questions were answered to the patient's satisfaction.     Sherrell Weir C

## 2012-10-20 NOTE — Brief Op Note (Signed)
10/20/2012  11:09 AM  PATIENT:  Adrian Rivas  52 y.o. male  PRE-OPERATIVE DIAGNOSIS:  stenosis lumbar   POST-OPERATIVE DIAGNOSIS:  stenosis lumbar   PROCEDURE:  Procedure(s): DECOMPRESSION L2-L3, L1-L2 (N/A)  SURGEON:  Surgeon(s) and Role:    * Javier Docker, MD - Primary  PHYSICIAN ASSISTANT:   ASSISTANTS: Bissell   ANESTHESIA:   general  EBL:  Total I/O In: 2000 [I.V.:2000] Out: 250 [Urine:50; Blood:200]  BLOOD ADMINISTERED:none  DRAINS: none   LOCAL MEDICATIONS USED:  MARCAINE     SPECIMEN:  No Specimen  DISPOSITION OF SPECIMEN:  N/A  COUNTS:  YES  TOURNIQUET:  * No tourniquets in log *  DICTATION: .Other Dictation: Dictation Number     [id number]  PLAN OF CARE: Admit for overnight observation  PATIENT DISPOSITION:  PACU - hemodynamically stable.   Delay start of Pharmacological VTE agent (>24hrs) due to surgical blood loss or risk of bleeding: yes

## 2012-10-20 NOTE — H&P (View-Only) (Signed)
Adrian Rivas is an 51 y.o. male.   Chief Complaint: back and B/L leg pain HPI: The patient is a 51 year old male who presents with 4 months of gradual onset LBP (left worse than right) and pain, numbness, stiffness, tightness and weakness in the left thigh, left lateral lower leg, right thigh and right lateral lower leg. The patient describes the pain as sharp. The patient describes the severity of their symptoms as severe and worsening. Symptoms are exacerbated by standing. Past treatment has included epidural injections (no significant relief).  Adrian Rivas follows up and he has had over four months of pain into his legs and recently over the past month inability to walk without a walker. He was seen by Dr. Ramos. He had an MRI performed of his lumbar spine. He has severe stenosis above the level of his decompression. Stenosis is now at L2-3 into L1-2 with disc herniations noted. No deformity of the conus. He reports pain into the anterior thighs. Difficulty walking. No fevers, chills, or change in bowel or bladder function. He has tried an epidural without relief. He has been doing his exercises without relief.  Hx prior lumbar decompression L3-4 and L4-5 by Dr. Beane in 05/2007.   No past medical history on file.  No past surgical history on file.  No family history on file. Social History:  has no tobacco, alcohol, and drug history on file.  Allergies: No Known Allergies   (Not in a hospital admission)  No results found for this or any previous visit (from the past 48 hour(s)). No results found.  Review of Systems  Constitutional: Negative.   HENT: Negative.   Eyes: Negative.   Respiratory: Negative.   Cardiovascular: Negative.   Gastrointestinal: Negative.   Genitourinary: Negative.   Musculoskeletal: Positive for back pain.  Skin: Negative.   Neurological: Positive for sensory change and focal weakness.  Endo/Heme/Allergies: Negative.   Psychiatric/Behavioral:  Negative.     There were no vitals taken for this visit. Physical Exam  Constitutional: He is oriented to person, place, and time. He appears well-developed and well-nourished. He appears distressed.  HENT:  Head: Normocephalic.  Eyes: Conjunctivae and EOM are normal. Pupils are equal, round, and reactive to light.  Neck: Normal range of motion. Neck supple.  Cardiovascular: Normal rate and regular rhythm.   Respiratory: Effort normal and breath sounds normal.  GI: Soft. Bowel sounds are normal.  Musculoskeletal:  On exam he is sitting on a rolling wheelchair. He has some slight hip flexor weakness as well as quad weakness. When he stands he walks with a forward flexed gait. On straight standing he has pain into the anterior lateral aspect of both thighs. Peripheral pulses are intact. No DVT. He has reduced extension secondary to pain, relieved with forward flexion. Nontender over the thoracic spine. Pelvis is stable. No flank pain with percussion. No instability in the hips, knees, and ankles.   Neurological: He is alert and oriented to person, place, and time.  Skin: Skin is warm and dry.  Psychiatric: He has a normal mood and affect.    MRI Lspine with right sided central extrusion L1-2 displacing L1 root; severe stenosis L2-3; B/L foraminal narrowing L3-4, L4-5, L5-S1.  Three view radiographs demonstrate no instability in the adjacent segments. Previous decompression at 3-4 and 4-5.  Assessment/Plan Stenosis L1-2, L2-3  Severe spinal stenosis at 2-3 extending into 1-2, history of lumbar decompression at 3-4 and 4-5. The patient with significant neurogenic claudication requiring   a walker, weakness in his hip flexors and quads despite rest, activity modifications, and injections.  I had an extensive discussion of the risks and benefits of lumbar decompression with the patient including bleeding, infection, damage to neurovascular structures, epidural fibrosis, CSF leakage  requiring repair. We also discussed increase in pain, adjacent segment disease, recurrent disc herniation, need for future surgery including repeat decompression and/or fusion. We also discussed risks of postoperative hematoma, paralysis, anesthetic complications including DVT, PE, death, cardiopulmonary dysfunction. In addition, the perioperative and postoperative courses were discussed in detail including the rehabilitative time and return to functional activity and work. I provided the patient with an illustrated handout and utilized the appropriate surgical models.  In the interim continue use of his walker, pain medicine, strategies to avoid reinjury. Preoperative clearance will be appreciated by his medical physician. Appreciate the kind referral.  Plan microlumbar decompression L1-2, L2-3, possible L3-4  Nakshatra Klose M. for Dr. Beane 10/13/2012, 12:42 PM    

## 2012-10-20 NOTE — Anesthesia Preprocedure Evaluation (Addendum)
Anesthesia Evaluation  Patient identified by MRN, date of birth, ID band Patient awake    Reviewed: Allergy & Precautions, H&P , NPO status , Patient's Chart, lab work & pertinent test results  Airway Mallampati: III TM Distance: >3 FB Neck ROM: full    Dental no notable dental hx. (+) Teeth Intact and Dental Advisory Given   Pulmonary COPD Mild COPD.  Former smoker.  No SOB breath sounds clear to auscultation  Pulmonary exam normal       Cardiovascular Exercise Tolerance: Good negative cardio ROS  Rhythm:regular Rate:Normal     Neuro/Psych negative neurological ROS  negative psych ROS   GI/Hepatic negative GI ROS, Neg liver ROS,   Endo/Other  negative endocrine ROS  Renal/GU negative Renal ROS  negative genitourinary   Musculoskeletal   Abdominal   Peds  Hematology negative hematology ROS (+)   Anesthesia Other Findings   Reproductive/Obstetrics negative OB ROS                          Anesthesia Physical Anesthesia Plan  ASA: II  Anesthesia Plan: General   Post-op Pain Management:    Induction: Intravenous  Airway Management Planned: Oral ETT  Additional Equipment:   Intra-op Plan:   Post-operative Plan: Extubation in OR  Informed Consent: I have reviewed the patients History and Physical, chart, labs and discussed the procedure including the risks, benefits and alternatives for the proposed anesthesia with the patient or authorized representative who has indicated his/her understanding and acceptance.   Dental Advisory Given  Plan Discussed with: CRNA and Surgeon  Anesthesia Plan Comments:         Anesthesia Quick Evaluation

## 2012-10-21 ENCOUNTER — Encounter (HOSPITAL_COMMUNITY): Payer: Self-pay | Admitting: Specialist

## 2012-10-21 LAB — BASIC METABOLIC PANEL
CO2: 28 mEq/L (ref 19–32)
Calcium: 8.8 mg/dL (ref 8.4–10.5)
Chloride: 105 mEq/L (ref 96–112)
Glucose, Bld: 115 mg/dL — ABNORMAL HIGH (ref 70–99)
Potassium: 4 mEq/L (ref 3.5–5.1)
Sodium: 140 mEq/L (ref 135–145)

## 2012-10-21 LAB — CBC
Hemoglobin: 12.9 g/dL — ABNORMAL LOW (ref 13.0–17.0)
MCH: 31.9 pg (ref 26.0–34.0)
RBC: 4.04 MIL/uL — ABNORMAL LOW (ref 4.22–5.81)
WBC: 9.5 10*3/uL (ref 4.0–10.5)

## 2012-10-21 NOTE — Evaluation (Signed)
Occupational Therapy Evaluation Patient Details Name: Adrian Rivas MRN: [id number] DOB: 1961-02-14 Today's Date: 10/21/2012 Time: 4098-1191 OT Time Calculation (min): 22 min  OT Assessment / Plan / Recommendation Clinical Impression  Pt is s/p back surgery and currently displays 7/10 pain with activity and a decrease in functional mobility and ADL. Will benefit from skilled OT services to improve ADL independence.     OT Assessment  Patient needs continued OT Services    Follow Up Recommendations  No OT follow up;Supervision/Assistance - 24 hour (as long as pt continue to progress with OT)    Barriers to Discharge      Equipment Recommendations  3 in 1 bedside comode    Recommendations for Other Services    Frequency  Min 2X/week    Precautions / Restrictions Precautions Precautions: Back Precaution Comments: Back care handout issued by PT; pt able to state 2/3 precautions. Reviewed all with pt and handout.        ADL  Eating/Feeding: Independent Where Assessed - Eating/Feeding: Chair Grooming: Wash/dry hands;Set up Where Assessed - Grooming: Supported sitting Upper Body Bathing: Chest;Right arm;Left arm;Abdomen;Set up;Supervision/safety (supervision for back precautions) Where Assessed - Upper Body Bathing: Unsupported sitting Lower Body Bathing: Maximal assistance (due to back precautions. unable to cross LEs up) Where Assessed - Lower Body Bathing: Supported sit to stand Upper Body Dressing: Set up;Supervision/safety Where Assessed - Upper Body Dressing: Unsupported sitting Lower Body Dressing: Maximal assistance Where Assessed - Lower Body Dressing: Supported sit to stand Toilet Transfer: Minimal assistance Toilet Transfer Method: Surveyor, minerals: Bedside commode Toileting - Clothing Manipulation and Hygiene: Maximal assistance (will benefit from a toilet aid likely) Where Assessed - Toileting Clothing Manipulation and Hygiene: Sit to stand  from 3-in-1 or toilet ADL Comments: Reviewed all back precautions with pt. He seems alittle slow to respond to questions at times, possibly from ? pain meds. Educated on AE options and he is interested in the AE kit. Will benefit from wide sock aid so if he decides to purchase kit, will look into whether sock aid can be exchanged with wide sock aid.     OT Diagnosis: Generalized weakness;Acute pain  OT Problem List: Decreased strength;Pain;Decreased knowledge of precautions;Decreased knowledge of use of DME or AE OT Treatment Interventions: Self-care/ADL training;Therapeutic activities;DME and/or AE instruction;Patient/family education   OT Goals Acute Rehab OT Goals OT Goal Formulation: With patient Time For Goal Achievement: 10/28/12 Potential to Achieve Goals: Good ADL Goals Pt Will Perform Grooming: with supervision;Standing at sink ADL Goal: Grooming - Progress: Goal set today Pt Will Perform Lower Body Bathing: with supervision;Sit to stand from chair;Sit to stand from bed;with adaptive equipment ADL Goal: Lower Body Bathing - Progress: Goal set today Pt Will Perform Lower Body Dressing: with supervision;Sit to stand from chair;Sit to stand from bed;with adaptive equipment ADL Goal: Lower Body Dressing - Progress: Goal set today Pt Will Transfer to Toilet: with supervision;Ambulation;3-in-1;Maintaining back safety precautions ADL Goal: Toilet Transfer - Progress: Goal set today Pt Will Perform Toileting - Hygiene: with supervision;with adaptive equipment;Sit to stand from 3-in-1/toilet ADL Goal: Toileting - Hygiene - Progress: Goal set today Pt Will Perform Tub/Shower Transfer: Shower transfer;with DME ADL Goal: Tub/Shower Transfer - Progress: Goal set today  Visit Information  Last OT Received On: 10/21/12 Assistance Needed: +1    Subjective Data  Subjective: still hurting. (has had pain meds) Patient Stated Goal: none stated. pt agreeable to OT   Prior Functioning      Home  Living Lives With: Spouse Available Help at Discharge: Family (wife works) Type of Home: House Home Access: Stairs to enter Secretary/administrator of Steps: 3 Entrance Stairs-Rails: Right;Left;Can reach both Home Layout: One level Bathroom Shower/Tub: Writer: None Prior Function Level of Independence: Independent Communication Communication: No difficulties         Vision/Perception     Cognition  Cognition Arousal/Alertness: Awake/alert (alittle slow with quetions at times) Behavior During Therapy: WFL for tasks assessed/performed    Extremity/Trunk Assessment Right Upper Extremity Assessment RUE ROM/Strength/Tone: [facility] for tasks assessed Left Upper Extremity Assessment LUE ROM/Strength/Tone: WFL for tasks assessed     Mobility Bed Mobility Details for Bed Mobility Assistance: up in chair Transfers Transfers: Sit to Stand;Stand to Sit Sit to Stand: 4: Min assist;With upper extremity assist;From chair/3-in-1 Stand to Sit: 4: Min assist;With upper extremity assist;To chair/3-in-1 Details for Transfer Assistance: verbal cues for hand placement     Exercise     Balance     End of Session OT - End of Session Activity Tolerance: Patient tolerated treatment well Patient left: in chair;with call bell/phone within reach  GO     Lennox Laity [phone] 10/21/2012, 12:30 PM

## 2012-10-21 NOTE — Progress Notes (Signed)
Subjective: 1 Day Post-Op Procedure(s) (LRB): DECOMPRESSION L2-L3, L1-L2 (N/A) Patient reports pain as 4 on 0-10 scale.    Objective: Vital signs in last 24 hours: Temp:  [98 F (36.7 C)-99.5 F (37.5 C)] 99.2 F (37.3 C) (06/19 0543) Pulse Rate:  [96-103] 103 (06/19 0543) Resp:  [8-16] 16 (06/19 0151) BP: (95-139)/(56-73) 128/70 mmHg (06/19 0543) SpO2:  [93 %-99 %] 93 % (06/19 0543) Weight:  [105.235 kg (232 lb)] 105.235 kg (232 lb) (06/18 1942)  Intake/Output from previous day: 06/18 0701 - 06/19 0700 In: 3290 [P.O.:240; I.V.:3000; IV Piggyback:50] Out: 540 [Urine:340; Blood:200] Intake/Output this shift:     Recent Labs  10/21/12 0504  HGB 12.9*    Recent Labs  10/21/12 0504  WBC 9.5  RBC 4.04*  HCT 40.2  PLT 160    Recent Labs  10/21/12 0504  NA 140  K 4.0  CL 105  CO2 28  BUN 7  CREATININE 0.84  GLUCOSE 115*  CALCIUM 8.8   No results found for this basename: LABPT, INR,  in the last 72 hours  Neurologically intact ABD soft Neurovascular intact Sensation intact distally Incision: dressing C/D/I  Assessment/Plan: 1 Day Post-Op Procedure(s) (LRB): DECOMPRESSION L2-L3, L1-L2 (N/A) Advance diet Up with therapy D/C IV fluids Plan for discharge tomorrow Discussed OR ISB D/C foley  Adrian Rivas C 10/21/2012, 7:03 AM

## 2012-10-21 NOTE — Progress Notes (Signed)
CSW consulted for SNF placement. PN reviewed. PT / OT do not recommend SNF at this time. Pt plans to return home following hospital d/c. RNCM will assist if home care services are needed.  Cori Razor LCSW [phone]

## 2012-10-21 NOTE — Progress Notes (Signed)
Physical Therapy Treatment Patient Details Name: Adrian Rivas MRN: [id number] DOB: [birth year] Today's Date: 10/21/2012 Time: 4098-1191 PT Time Calculation (min): 20 min  PT Assessment / Plan / Recommendation Comments on Treatment Session  Marked improvement in activity tolerance from this am    Follow Up Recommendations  No PT follow up     Does the patient have the potential to tolerate intense rehabilitation     Barriers to Discharge None      Equipment Recommendations  Rolling walker with 5" wheels    Recommendations for Other Services OT consult  Frequency Min 6X/week   Plan Discharge plan remains appropriate    Precautions / Restrictions Precautions Precautions: Back Precaution Booklet Issued: Yes (comment) Precaution Comments: Pt recalls 2/3 back precautions Restrictions Weight Bearing Restrictions: No   Pertinent Vitals/Pain 4-5/10; premed    Mobility  Bed Mobility Bed Mobility: Sit to Supine Supine to Sit: 4: Min assist;3: Mod assist Details for Bed Mobility Assistance: cues for sequence and log roll technique Transfers Transfers: Sit to Stand;Stand to Sit Sit to Stand: 4: Min assist;With upper extremity assist;From chair/3-in-1 Stand to Sit: 4: Min assist;With upper extremity assist;To bed Details for Transfer Assistance: verbal cues for hand placement Ambulation/Gait Ambulation/Gait Assistance: 4: Min assist Ambulation Distance (Feet): 200 Feet Assistive device: Rolling walker Ambulation/Gait Assistance Details: cues for posture and position from RW Gait Pattern: Step-through pattern;Decreased stride length Stairs: No    Exercises     PT Diagnosis: Difficulty walking  PT Problem List: Decreased strength;Decreased range of motion;Decreased activity tolerance;Decreased mobility;Pain;Obesity;Decreased knowledge of use of DME PT Treatment Interventions: DME instruction;Gait training;Stair training;Functional mobility training;Therapeutic  activities;Therapeutic exercise;Patient/family education   PT Goals Acute Rehab PT Goals PT Goal Formulation: With patient Time For Goal Achievement: 10/27/12 Potential to Achieve Goals: Good Pt will go Supine/Side to Sit: with supervision PT Goal: Supine/Side to Sit - Progress: Progressing toward goal Pt will go Sit to Supine/Side: with supervision PT Goal: Sit to Supine/Side - Progress: Progressing toward goal Pt will go Sit to Stand: with supervision PT Goal: Sit to Stand - Progress: Progressing toward goal Pt will go Stand to Sit: with supervision PT Goal: Stand to Sit - Progress: Progressing toward goal Pt will Ambulate: >150 feet;with supervision;with rolling walker PT Goal: Ambulate - Progress: Progressing toward goal Pt will Go Up / Down Stairs: 3-5 stairs;with min assist;with least restrictive assistive device PT Goal: Up/Down Stairs - Progress: Goal set today  Visit Information  Last PT Received On: 10/21/12 Assistance Needed: +1    Subjective Data  Subjective: I am doing better than this morning Patient Stated Goal: Go to the beach in August with decreased pain   Cognition  Cognition Arousal/Alertness: Awake/alert Behavior During Therapy: WFL for tasks assessed/performed Overall Cognitive Status: Within Functional Limits for tasks assessed    Balance     End of Session PT - End of Session Activity Tolerance: Patient tolerated treatment well Patient left: in bed;with call bell/phone within reach Nurse Communication: Mobility status   GP     Adrian Rivas 10/21/2012, 1:48 PM

## 2012-10-21 NOTE — Care Management Note (Signed)
    Page 1 of 2   10/22/2012     4:23:24 PM   CARE MANAGEMENT NOTE 10/22/2012  Patient:  Adrian Rivas, Adrian Rivas   Account Number:  [id number]  Date Initiated:  10/21/2012  Documentation initiated by:  Colleen Can  Subjective/Objective Assessment:   dx lumbar stenosis; lumbar  decompression  L1-2, L2-3, bilateral foraminotomies L2, L3, L1, redo  decompression at L3-4.     Action/Plan:   CM spoke with patient. Plans are for patient to return to his home in [place] where spouse will be caregiver. States he will need RW and commode seat but is unsure that seat will fit in BR.   Anticipated DC Date:  10/22/2012   Anticipated DC Plan:  HOME W HOME HEALTH SERVICES  In-house referral  Clinical Social Worker      DC Planning Services  CM consult      [facility] Choice  DURABLE MEDICAL EQUIPMENT   Choice offered to / List presented to:     DME arranged  Levan Hurst      DME agency  [facility]        Status of service:  Completed, signed off Medicare Important Message given?   (If response is "NO", the following Medicare IM given date fields will be blank) Date Medicare IM given:   Date Additional Medicare IM given:    Discharge Disposition:  HOME/SELF CARE  Per UR Regulation:  Reviewed for med. necessity/level of care/duration of stay  If discussed at Long Length of Stay Meetings, dates discussed:    Comments:  10/22/2012 Colleen Can BSN RN CCM RW was delivered to patien't's rm per DME rep. Prefers not to get 3n1 at this time-not sure it will fit in his bathroom. Pt discharged to home.   10/21/2012 Colleen Can BSN RN CCM [phone] Pt may needs to get prescrption for 3n1/BSC because not sure of measurements in BR. [facility] rep notified of DME needs.

## 2012-10-21 NOTE — Evaluation (Signed)
Physical Therapy Evaluation Patient Details Name: Adrian Rivas MRN: [id number] DOB: 07/31/1960 Today's Date: 10/21/2012 Time: 4696-2952 PT Time Calculation (min): 34 min  PT Assessment / Plan / Recommendation Clinical Impression  Pt s/p lumbar decompression presents with limitations to functional mobility 2* post op pain and back precautions    PT Assessment  Patient needs continued PT services    Follow Up Recommendations  No PT follow up    Does the patient have the potential to tolerate intense rehabilitation      Barriers to Discharge None      Equipment Recommendations  Rolling walker with 5" wheels    Recommendations for Other Services OT consult   Frequency Min 6X/week    Precautions / Restrictions Precautions Precautions: Back Precaution Booklet Issued: Yes (comment) Precaution Comments: Back care handout issued by PT; pt able to state 2/3 precautions. Reviewed all with pt and handout. Restrictions Weight Bearing Restrictions: No   Pertinent Vitals/Pain 6-7/10: premed      Mobility  Bed Mobility Bed Mobility: Supine to Sit Supine to Sit: 3: Mod assist Details for Bed Mobility Assistance: up in chair Transfers Transfers: Sit to Stand;Stand to Sit Sit to Stand: 4: Min assist;With upper extremity assist;From chair/3-in-1 Stand to Sit: 4: Min assist;With upper extremity assist;To chair/3-in-1 Details for Transfer Assistance: verbal cues for hand placement Ambulation/Gait Ambulation/Gait Assistance: 4: Min assist;3: Mod assist Ambulation Distance (Feet): 60 Feet Assistive device: Rolling walker Ambulation/Gait Assistance Details: cues for posture and position from RW Gait Pattern: Step-to pattern;Step-through pattern;Decreased stride length Stairs: No    Exercises     PT Diagnosis: Difficulty walking  PT Problem List: Decreased strength;Decreased range of motion;Decreased activity tolerance;Decreased mobility;Pain;Obesity;Decreased knowledge of use of  DME PT Treatment Interventions: DME instruction;Gait training;Stair training;Functional mobility training;Therapeutic activities;Therapeutic exercise;Patient/family education   PT Goals Acute Rehab PT Goals PT Goal Formulation: With patient Time For Goal Achievement: 10/27/12 Potential to Achieve Goals: Good Pt will go Supine/Side to Sit: with supervision PT Goal: Supine/Side to Sit - Progress: Goal set today Pt will go Sit to Supine/Side: with supervision PT Goal: Sit to Supine/Side - Progress: Goal set today Pt will go Sit to Stand: with supervision PT Goal: Sit to Stand - Progress: Goal set today Pt will go Stand to Sit: with supervision PT Goal: Stand to Sit - Progress: Goal set today Pt will Ambulate: >150 feet;with supervision;with rolling walker PT Goal: Ambulate - Progress: Goal set today Pt will Go Up / Down Stairs: 3-5 stairs;with min assist;with least restrictive assistive device PT Goal: Up/Down Stairs - Progress: Goal set today  Visit Information  Last PT Received On: 10/21/12 Assistance Needed: +1    Subjective Data  Subjective: My legs hurt almost the same as before surgery - the L one hurts worse Patient Stated Goal: Go to the beach in August with decreased pain   Prior Functioning  Home Living Lives With: Spouse Available Help at Discharge: Family (wife works) Type of Home: House Home Access: Stairs to enter Secretary/administrator of Steps: 3 Entrance Stairs-Rails: Right;Left;Can reach both Home Layout: One level Bathroom Shower/Tub: Health visitor: Standard Home Adaptive Equipment: None Prior Function Level of Independence: Independent Able to Take Stairs?: Yes Communication Communication: No difficulties Dominant Hand: Right    Cognition  Cognition Arousal/Alertness: Awake/alert (alittle slow with quetions at times) Behavior During Therapy: WFL for tasks assessed/performed Overall Cognitive Status: Within Functional Limits for  tasks assessed    Extremity/Trunk Assessment Right Upper Extremity Assessment RUE  ROM/Strength/Tone: [facility] for tasks assessed Left Upper Extremity Assessment LUE ROM/Strength/Tone: [facility] for tasks assessed Right Lower Extremity Assessment RLE ROM/Strength/Tone: Deficits RLE ROM/Strength/Tone Deficits: AROM DF to neutal only  Left Lower Extremity Assessment LLE ROM/Strength/Tone: WFL for tasks assessed   Balance    End of Session PT - End of Session Activity Tolerance: Patient tolerated treatment well Patient left: in chair;with call bell/phone within reach Nurse Communication: Mobility status  GP     Justen Fonda 10/21/2012, 1:39 PM

## 2012-10-22 NOTE — Progress Notes (Addendum)
Occupational Therapy Treatment Patient Details Name: Adrian Rivas MRN: [id number] DOB: [birth year] Today's Date: 10/22/2012 Time: 6213-0865 OT Time Calculation (min): 39 min  OT Assessment / Plan / Recommendation Comments on Treatment Session Pt educated further on AE options and toilet aid. Wife purchased kit from gift shop and plans to purchase toilet riser and toilet aid own their own. Pt able to state all back precautions but needed some reinforcement to follow during ADL.    Follow Up Recommendations  No OT follow up;Supervision/Assistance - 24 hour    Barriers to Discharge       Equipment Recommendations  Other (comment) (wife to purchase riser with handles on her own)    Recommendations for Other Services    Frequency Min 2X/week   Plan Discharge plan remains appropriate    Precautions / Restrictions Precautions Precautions: Back Precaution Booklet Issued: Yes (comment) Precaution Comments: Pt recalls all back precautions Restrictions Weight Bearing Restrictions: No        ADL  Upper Body Dressing:  (pt performed) Lower Body Dressing: Minimal assistance (for sit to stand and light support to pull up pants) Where Assessed - Lower Body Dressing: Supported sit to stand Toilet Transfer: Minimal assistance Toilet Transfer Method: Sit to stand Toileting - Clothing Manipulation and Hygiene: Supervision/safety (with toilet aid sitting (didnt actually use aid)) ADL Comments: Encouraged pt to check with MD to see when he can shower but that he will benefit from a day or so of sponge bathing even if allowed to shower right away as he is still having increased pain and needs to feel ready to stand in shower. He only has a corner seat in shower and it is low. He states a shower seat wont fit into shower so explained he needs to feel strong and pain controlled to stand and shower. Pt verbalized understanding. Demonstrated technique for stepping into shower when he is ready to shower.  Educated further on toilet aid and pt used toilet aid to reach around and practice what toilet hygiene would be like. He verbalizes that he will obtain toilet aid as well as riser with handles on his own as he is not sure a 3in1 will not fit around toilet at home.  Pt able to state all back precautions today. He did well with reacher to don underwear and shorts. Discussed long handle sponge use and shoe horn. Pt needed wide sock aid so exchanged out wide from AE kit that wife purchased for pt. Pt verbalizes understanding of all education and is supposed to discharge today.     OT Diagnosis:    OT Problem List:   OT Treatment Interventions:     OT Goals Acute Rehab OT Goals OT Goal Formulation: With patient Time For Goal Achievement: 10/28/12 Potential to Achieve Goals: Good ADL Goals Pt Will Perform Grooming: with supervision;Standing at sink Pt Will Perform Lower Body Bathing: with supervision;Sit to stand from chair;Sit to stand from bed;with adaptive equipment Pt Will Perform Lower Body Dressing: with supervision;Sit to stand from chair;Sit to stand from bed;with adaptive equipment ADL Goal: Lower Body Dressing - Progress: Progressing toward goals Pt Will Transfer to Toilet: with supervision;Ambulation;3-in-1;Maintaining back safety precautions ADL Goal: Toilet Transfer - Progress: Progressing toward goals Pt Will Perform Toileting - Hygiene: with supervision;with adaptive equipment;Sit to stand from 3-in-1/toilet ADL Goal: Toileting - Hygiene - Progress: Progressing toward goals Pt Will Perform Tub/Shower Transfer: Shower transfer;with DME  Visit Information  Last OT Received On: 10/22/12 Assistance Needed: +1  Subjective Data  Subjective: it still hurts Patient Stated Goal: none stated. agreeable to get up with OT   Prior Functioning       Cognition  Cognition Arousal/Alertness: Awake/alert Behavior During Therapy: [facility] for tasks assessed/performed    Mobility  Bed  Mobility Bed Mobility: Rolling Left;Left Sidelying to Sit;Sit to Sidelying Left;Rolling Right Rolling Right: 5: Supervision Rolling Left: 5: Supervision;With rail Left Sidelying to Sit: 4: Min guard;HOB flat Sit to Sidelying Left: 4: Min guard;With rail Transfers Transfers: Sit to Stand;Stand to Sit Sit to Stand: 4: Min assist;With upper extremity assist;From bed Stand to Sit: 4: Min guard;With upper extremity assist;To bed Details for Transfer Assistance: verbal cues to keep back straight    Exercises      Balance     End of Session OT - End of Session Activity Tolerance: Patient tolerated treatment well Patient left: in bed;with call bell/phone within reach  GO     Lennox Laity [phone] 10/22/2012, 12:48 PM

## 2012-10-22 NOTE — Progress Notes (Signed)
Physical Therapy Treatment Patient Details Name: Adrian Rivas MRN: [id number] DOB: November 24, 1960 Today's Date: 10/22/2012 Time: 0810-0856 PT Time Calculation (min): 46 min  PT Assessment / Plan / Recommendation Comments on Treatment Session  Marked improvement from last session    Follow Up Recommendations  No PT follow up     Does the patient have the potential to tolerate intense rehabilitation     Barriers to Discharge        Equipment Recommendations  Rolling walker with 5" wheels    Recommendations for Other Services OT consult  Frequency Min 6X/week   Plan Discharge plan remains appropriate    Precautions / Restrictions Precautions Precautions: Back Precaution Booklet Issued: Yes (comment) Precaution Comments: Pt recalls all back precautions Restrictions Weight Bearing Restrictions: No   Pertinent Vitals/Pain 7/10 with activity; premed, RN aware    Mobility  Bed Mobility Bed Mobility: Sit to Supine;Supine to Sit Supine to Sit: 5: Supervision Sit to Supine: 4: Min guard;5: Set up Details for Bed Mobility Assistance: tactile cues to complete roll to side prior to rolling to back on entering bed; repeated x 3 Transfers Transfers: Sit to Stand;Stand to Sit Sit to Stand: 5: Supervision;From chair/3-in-1;From bed;With armrests Stand to Sit: 5: Supervision;To bed;To chair/3-in-1 Details for Transfer Assistance: verbal cues for hand placement Ambulation/Gait Ambulation/Gait Assistance: 4: Min guard;5: Supervision Ambulation Distance (Feet): 200 Feet Assistive device: Rolling walker Ambulation/Gait Assistance Details: min cues for posture and position from RW Gait Pattern: Step-through pattern;Decreased stride length;Trunk flexed Stairs: Yes Stairs Assistance: 4: Min assist Stairs Assistance Details (indicate cue type and reason): cues for sequence  Stair Management Technique: One rail Right;Forwards;Step to pattern Number of Stairs: 2    Exercises     PT  Diagnosis:    PT Problem List:   PT Treatment Interventions:     PT Goals Acute Rehab PT Goals PT Goal Formulation: With patient Time For Goal Achievement: 10/27/12 Potential to Achieve Goals: Good Pt will go Supine/Side to Sit: with supervision PT Goal: Supine/Side to Sit - Progress: Met Pt will go Sit to Supine/Side: with supervision PT Goal: Sit to Supine/Side - Progress: Progressing toward goal Pt will go Sit to Stand: with supervision PT Goal: Sit to Stand - Progress: Met Pt will go Stand to Sit: with supervision PT Goal: Stand to Sit - Progress: Met Pt will Ambulate: >150 feet;with supervision;with rolling walker PT Goal: Ambulate - Progress: Met Pt will Go Up / Down Stairs: 3-5 stairs;with min assist;with least restrictive assistive device PT Goal: Up/Down Stairs - Progress: Progressing toward goal  Visit Information  Last PT Received On: 10/22/12 Assistance Needed: +1    Subjective Data  Subjective: I'm going home today Patient Stated Goal: Go to the beach in [place] with decreased pain   Cognition  Cognition Arousal/Alertness: Awake/alert Behavior During Therapy: WFL for tasks assessed/performed Overall Cognitive Status: Within Functional Limits for tasks assessed    Balance     End of Session PT - End of Session Activity Tolerance: Patient tolerated treatment well Patient left: in chair;with call bell/phone within reach Nurse Communication: Mobility status   GP     Adrian Rivas 10/22/2012, 8:57 AM

## 2012-10-22 NOTE — Discharge Summary (Signed)
Physician Discharge Summary   Patient ID: Adrian Rivas MRN: [id number] DOB/AGE: July 20, 1960 52 y.o.  Admit date: 10/20/2012 Discharge date: 10/22/2012  Primary Diagnosis:   stenosis lumbar   Admission Diagnoses:  Past Medical History  Diagnosis Date  . Anxiety   . Pain     CHRONIC NECK PAIN- HX OF ANTERIOR FUSION  . Depression   . Dyslipidemia   . Pain     LOWER BACK WITH PAIN DOWN BOTH LEGS - BUT WORSE LEFT LEG WITH NUMBNESS OUTER SIDE OF LEFT LEG - HAS SPINAL STENOSIS  . COPD (chronic obstructive pulmonary disease)     TOLD HE HAS COPD - QUIT SMOKING 15 YRS AGO-- NO C/O OF SOB  . Arthritis    Discharge Diagnoses:   Principal Problem:   Spinal stenosis of lumbar region  Procedure:  Procedure(s) (LRB): DECOMPRESSION L2-L3, L1-L2 (N/A)   Consults: None  HPI:  see H&P    Laboratory Data: Hospital Outpatient Visit on 10/13/2012  Component Date Value Range Status  . MRSA, PCR 10/13/2012 NEGATIVE  NEGATIVE Final  . Staphylococcus aureus 10/13/2012 NEGATIVE  NEGATIVE Final   Comment:                                 The Xpert SA Assay (FDA                          approved for NASAL specimens                          in patients over 81 years of age),                          is one component of                          a comprehensive surveillance                          program.  Test performance has                          been validated by [employer] for patients greater                          than or equal to 15 year old.                          It is not intended                          to diagnose infection nor to                          guide or monitor treatment.    Recent Labs  10/21/12 0504  HGB 12.9*    Recent Labs  10/21/12 0504  WBC 9.5  RBC 4.04*  HCT 40.2  PLT 160    Recent Labs  10/21/12 0504  NA  140  K 4.0  CL 105  CO2 28  BUN 7  CREATININE 0.84  GLUCOSE 115*  CALCIUM 8.8   No results  found for this basename: LABPT, INR,  in the last 72 hours  X-Rays:Dg Chest 2 View  10/13/2012   *RADIOLOGY REPORT*  Clinical Data: Preoperative evaluation for lumbar decompression at L1-L2 and L2-L3, history COPD  CHEST - 2 VIEW  Comparison: 03/18/2010  Findings: Normal heart size, mediastinal contours, and pulmonary vascularity. Lungs clear. No pleural effusion or pneumothorax. Mild scattered endplate spur formation thoracic spine. Prior cervical spine fusion.  IMPRESSION: No acute abnormalities.   Original Report Authenticated By: Ulyses Southward, M.D.   Dg Lumbar Spine 2-3 Views  10/13/2012   *RADIOLOGY REPORT*  Clinical Data: Preoperative evaluation for lumbar decompression at L1-L2 and L2-L3, prior back surgeries, stenosis  LUMBAR SPINE - 2-3 VIEW  Comparison: Intraoperative images 01/06/2008  Findings: Osseous demineralization diffusely. Five non-rib bearing lumbar vertebrae, numbering annotated on images. Broad-based dextroconvex thoracolumbar scoliosis. Prior laminectomy L4. Vertebral body heights maintained. Diffuse disc space narrowing and endplate spur formation. Multilevel facet degenerative changes. Minimal retrolisthesis at L3-L4. Vertebral body heights maintained without fracture or additional subluxation. No definite bone destruction or gross evidence of spondylolysis on AP/lateral exam.  IMPRESSION: Multilevel degenerative disc and facet disease changes of the lumbar spine with minimal broad-based dextroconvex scoliosis.   Original Report Authenticated By: Ulyses Southward, M.D.   Dg Spine Portable 1 View  10/20/2012   *RADIOLOGY REPORT*  Clinical Data: Intraoperative localization film in patient for lumbar surgery.  PORTABLE SPINE - 1 VIEW  Comparison: Plain film 10/20/2012 at 9:12 a.m.  Findings: Single lateral view lumbar spine demonstrates a probe localizing L1-2.  Second more inferior probe is at the level of the L3 pedicles.  IMPRESSION: Localization as above.   Original Report Authenticated  By: Holley Dexter, M.D.   Dg Spine Portable 1 View  10/20/2012   *RADIOLOGY REPORT*  Clinical Data: Intraoperative localization for lumbar surgery.  PORTABLE SPINE - 1 VIEW  Comparison: Plain films 10/13/2012.  Findings: Two probes are in place.  The more superior is at the level of the L1-2 interspace and the more inferior is directed toward the L2-3 interspace.  IMPRESSION: Localization as above.   Original Report Authenticated By: Holley Dexter, M.D.    EKG: Orders placed during the hospital encounter of 10/13/12  . EKG 12-LEAD  . EKG 12-LEAD     Hospital Course: Patient was admitted to [facility] and taken to the OR and underwent the above state procedure without complications.  Patient tolerated the procedure well and was later transferred to the recovery room and then to the orthopaedic floor for postoperative care.  They were given PO and IV analgesics for pain control following their surgery.  They were given 24 hours of postoperative antibiotics.   PT was consulted postop to assist with mobility and transfers.  The patient was allowed to be WBAT with therapy and was taught back precautions. Discharge planning was consulted to help with postop disposition and equipment needs.  Patient had a fair night on the evening of surgery and started to get up OOB with therapy on day one. Patient was seen in rounds and was ready to go home on day two.  They were given discharge instructions and dressing directions.  They were instructed on when to follow up in the office with Dr. Shelle Iron.  Discharge Medications: Prior to Admission medications   Medication Sig Start Date  End Date Taking? Authorizing Provider  ARIPiprazole (ABILIFY) 10 MG tablet Take 10 mg by mouth at bedtime.   Yes Historical Provider, MD  cholecalciferol (VITAMIN D) 400 UNITS TABS Take 400 Units by mouth daily.   Yes Historical Provider, MD  desvenlafaxine (PRISTIQ) 100 MG 24 hr tablet Take 100 mg by mouth every morning.    Yes Historical Provider, MD  lamoTRIgine (LAMICTAL) 100 MG tablet Take 200 mg by mouth every morning. TAKES 2 100 MG TABS EVERY AM   Yes Historical Provider, MD  mometasone (NASONEX) 50 MCG/ACT nasal spray Place 2 sprays into the nose daily as needed.    Yes Historical Provider, MD  Multiple Vitamin (MULTIVITAMIN WITH MINERALS) TABS Take 1 tablet by mouth daily.   Yes Historical Provider, MD  rosuvastatin (CRESTOR) 20 MG tablet Take 20 mg by mouth at bedtime.   Yes Historical Provider, MD  zolpidem (AMBIEN) 10 MG tablet Take 10 mg by mouth at bedtime as needed for sleep.   Yes Historical Provider, MD  methocarbamol (ROBAXIN) 500 MG tablet Take 1 tablet (500 mg total) by mouth 4 (four) times daily. 10/20/12   Dayna Barker. Bissell, PA-C  oxyCODONE-acetaminophen (PERCOCET) 7.5-325 MG per tablet Take 1-2 tablets by mouth every 4 (four) hours as needed for pain. 10/20/12   Dayna Barker. Bissell, PA-C  phentermine (ADIPEX-P) 37.5 MG tablet Take 37.5 mg by mouth daily before breakfast.    Historical Provider, MD    Diet: Regular diet Activity:WBAT Follow-up:in 10-14 days Disposition - Home Discharged Condition: good   Discharge Orders   Future Orders Complete By Expires     Call MD / Call 911  As directed     Comments:      If you experience chest pain or shortness of breath, CALL 911 and be transported to the hospital emergency room.  If you develope a fever above 101 F, pus (white drainage) or increased drainage or redness at the wound, or calf pain, call your surgeon's office.    Constipation Prevention  As directed     Comments:      Drink plenty of fluids.  Prune juice may be helpful.  You may use a stool softener, such as Colace (over the counter) 100 mg twice a day.  Use MiraLax (over the counter) for constipation as needed.    Diet - low sodium heart healthy  As directed     Increase activity slowly as tolerated  As directed         Medication List    STOP taking these medications        HYDROcodone-acetaminophen 10-325 MG per tablet  Commonly known as:  NORCO      TAKE these medications       ARIPiprazole 10 MG tablet  Commonly known as:  ABILIFY  Take 10 mg by mouth at bedtime.     cholecalciferol 400 UNITS Tabs  Commonly known as:  VITAMIN D  Take 400 Units by mouth daily.     desvenlafaxine 100 MG 24 hr tablet  Commonly known as:  PRISTIQ  Take 100 mg by mouth every morning.     lamoTRIgine 100 MG tablet  Commonly known as:  LAMICTAL  Take 200 mg by mouth every morning. TAKES 2 100 MG TABS EVERY AM     methocarbamol 500 MG tablet  Commonly known as:  ROBAXIN  Take 1 tablet (500 mg total) by mouth 4 (four) times daily.     mometasone 50 MCG/ACT nasal spray  Commonly known as:  NASONEX  Place 2 sprays into the nose daily as needed.     multivitamin with minerals Tabs  Take 1 tablet by mouth daily.     oxyCODONE-acetaminophen 7.5-325 MG per tablet  Commonly known as:  PERCOCET  Take 1-2 tablets by mouth every 4 (four) hours as needed for pain.     phentermine 37.5 MG tablet  Commonly known as:  ADIPEX-P  Take 37.5 mg by mouth daily before breakfast.     rosuvastatin 20 MG tablet  Commonly known as:  CRESTOR  Take 20 mg by mouth at bedtime.     zolpidem 10 MG tablet  Commonly known as:  AMBIEN  Take 10 mg by mouth at bedtime as needed for sleep.           Follow-up Information   Follow up with BEANE,JEFFREY C, MD In 2 weeks.   Contact information:   [address] [place] [address] [phone]       Signed: Dorothy Spark. 10/22/2012, 7:27 AM

## 2012-10-22 NOTE — Progress Notes (Signed)
Subjective: 2 Days Post-Op Procedure(s) (LRB): DECOMPRESSION L2-L3, L1-L2 (N/A) Patient reports pain as mild.  Notes mostly incisional back pain, also noting some L thigh pain, improved from pre-op.  Objective: Vital signs in last 24 hours: Temp:  [99.2 F (37.3 C)-99.5 F (37.5 C)] 99.3 F (37.4 C) (06/20 0526) Pulse Rate:  [94-111] 96 (06/20 0649) Resp:  [15-16] 16 (06/20 0526) BP: (111-171)/(71-102) 158/90 mmHg (06/20 0610) SpO2:  [85 %-94 %] 93 % (06/20 0649)  Intake/Output from previous day: 06/19 0701 - 06/20 0700 In: 830 [P.O.:820; I.V.:10] Out: 1215 [Urine:1215] Intake/Output this shift:     Recent Labs  10/21/12 0504  HGB 12.9*    Recent Labs  10/21/12 0504  WBC 9.5  RBC 4.04*  HCT 40.2  PLT 160    Recent Labs  10/21/12 0504  NA 140  K 4.0  CL 105  CO2 28  BUN 7  CREATININE 0.84  GLUCOSE 115*  CALCIUM 8.8   No results found for this basename: LABPT, INR,  in the last 72 hours  Neurologically intact ABD soft Neurovascular intact Sensation intact distally Intact pulses distally Dorsiflexion/Plantar flexion intact Incision: dressing C/D/I and no drainage No cellulitis present Compartment soft  Assessment/Plan: 2 Days Post-Op Procedure(s) (LRB): DECOMPRESSION L2-L3, L1-L2 (N/A) Advance diet Up with therapy Change dressing prior to D/C Discussed D/C instructions D/C later today after ambulating with PT  Will discuss with Dr. Elissa Lovett, Dayna Barker. 10/22/2012, 7:22 AM

## 2016-02-15 ENCOUNTER — Inpatient Hospital Stay (HOSPITAL_COMMUNITY)
Admission: EM | Admit: 2016-02-15 | Discharge: 2016-02-23 | DRG: 493 | Disposition: A | Discharge status: Rehabilitation Facility | Payer: BLUE CROSS/BLUE SHIELD | Attending: Orthopedic Surgery | Admitting: Orthopedic Surgery

## 2016-02-15 ENCOUNTER — Encounter (HOSPITAL_COMMUNITY): Payer: Self-pay | Admitting: Emergency Medicine

## 2016-02-15 ENCOUNTER — Emergency Department (HOSPITAL_COMMUNITY): Payer: BLUE CROSS/BLUE SHIELD

## 2016-02-15 DIAGNOSIS — W19XXXD Unspecified fall, subsequent encounter: Secondary | ICD-10-CM | POA: Diagnosis present

## 2016-02-15 DIAGNOSIS — S82141A Displaced bicondylar fracture of right tibia, initial encounter for closed fracture: Secondary | ICD-10-CM | POA: Diagnosis present

## 2016-02-15 DIAGNOSIS — D62 Acute posthemorrhagic anemia: Secondary | ICD-10-CM | POA: Diagnosis present

## 2016-02-15 DIAGNOSIS — I10 Essential (primary) hypertension: Secondary | ICD-10-CM | POA: Diagnosis present

## 2016-02-15 DIAGNOSIS — N400 Enlarged prostate without lower urinary tract symptoms: Secondary | ICD-10-CM | POA: Diagnosis present

## 2016-02-15 DIAGNOSIS — J449 Chronic obstructive pulmonary disease, unspecified: Secondary | ICD-10-CM

## 2016-02-15 DIAGNOSIS — G8918 Other acute postprocedural pain: Secondary | ICD-10-CM | POA: Diagnosis not present

## 2016-02-15 DIAGNOSIS — Z87891 Personal history of nicotine dependence: Secondary | ICD-10-CM

## 2016-02-15 DIAGNOSIS — Z79899 Other long term (current) drug therapy: Secondary | ICD-10-CM | POA: Diagnosis not present

## 2016-02-15 DIAGNOSIS — Z6841 Body Mass Index (BMI) 40.0 and over, adult: Secondary | ICD-10-CM

## 2016-02-15 DIAGNOSIS — M549 Dorsalgia, unspecified: Secondary | ICD-10-CM | POA: Diagnosis present

## 2016-02-15 DIAGNOSIS — F5101 Primary insomnia: Secondary | ICD-10-CM | POA: Diagnosis not present

## 2016-02-15 DIAGNOSIS — S82102A Unspecified fracture of upper end of left tibia, initial encounter for closed fracture: Secondary | ICD-10-CM

## 2016-02-15 DIAGNOSIS — R0902 Hypoxemia: Secondary | ICD-10-CM | POA: Diagnosis present

## 2016-02-15 DIAGNOSIS — S82142A Displaced bicondylar fracture of left tibia, initial encounter for closed fracture: Secondary | ICD-10-CM | POA: Diagnosis present

## 2016-02-15 DIAGNOSIS — J441 Chronic obstructive pulmonary disease with (acute) exacerbation: Secondary | ICD-10-CM | POA: Diagnosis present

## 2016-02-15 DIAGNOSIS — M79609 Pain in unspecified limb: Secondary | ICD-10-CM | POA: Diagnosis not present

## 2016-02-15 DIAGNOSIS — W010XXA Fall on same level from slipping, tripping and stumbling without subsequent striking against object, initial encounter: Secondary | ICD-10-CM | POA: Diagnosis present

## 2016-02-15 DIAGNOSIS — R791 Abnormal coagulation profile: Secondary | ICD-10-CM | POA: Diagnosis not present

## 2016-02-15 DIAGNOSIS — E876 Hypokalemia: Secondary | ICD-10-CM | POA: Diagnosis present

## 2016-02-15 DIAGNOSIS — Z981 Arthrodesis status: Secondary | ICD-10-CM

## 2016-02-15 DIAGNOSIS — E785 Hyperlipidemia, unspecified: Secondary | ICD-10-CM | POA: Diagnosis present

## 2016-02-15 DIAGNOSIS — F329 Major depressive disorder, single episode, unspecified: Secondary | ICD-10-CM | POA: Diagnosis present

## 2016-02-15 DIAGNOSIS — G8929 Other chronic pain: Secondary | ICD-10-CM | POA: Diagnosis present

## 2016-02-15 DIAGNOSIS — K5900 Constipation, unspecified: Secondary | ICD-10-CM | POA: Diagnosis present

## 2016-02-15 DIAGNOSIS — R269 Unspecified abnormalities of gait and mobility: Secondary | ICD-10-CM

## 2016-02-15 DIAGNOSIS — T148XXA Other injury of unspecified body region, initial encounter: Secondary | ICD-10-CM

## 2016-02-15 DIAGNOSIS — G4700 Insomnia, unspecified: Secondary | ICD-10-CM | POA: Diagnosis present

## 2016-02-15 DIAGNOSIS — S82142D Displaced bicondylar fracture of left tibia, subsequent encounter for closed fracture with routine healing: Secondary | ICD-10-CM | POA: Diagnosis present

## 2016-02-15 DIAGNOSIS — F418 Other specified anxiety disorders: Secondary | ICD-10-CM | POA: Diagnosis present

## 2016-02-15 DIAGNOSIS — S82209A Unspecified fracture of shaft of unspecified tibia, initial encounter for closed fracture: Secondary | ICD-10-CM | POA: Insufficient documentation

## 2016-02-15 DIAGNOSIS — S82142S Displaced bicondylar fracture of left tibia, sequela: Secondary | ICD-10-CM | POA: Diagnosis not present

## 2016-02-15 HISTORY — DX: Essential (primary) hypertension: I10

## 2016-02-15 LAB — CBC WITH DIFFERENTIAL/PLATELET
BASOS PCT: 0 %
Basophils Absolute: 0 10*3/uL (ref 0.0–0.1)
EOS ABS: 0 10*3/uL (ref 0.0–0.7)
EOS PCT: 0 %
HCT: 41.9 % (ref 39.0–52.0)
HEMOGLOBIN: 14.1 g/dL (ref 13.0–17.0)
Lymphocytes Relative: 9 %
Lymphs Abs: 1.1 10*3/uL (ref 0.7–4.0)
MCH: 34.8 pg — AB (ref 26.0–34.0)
MCHC: 33.7 g/dL (ref 30.0–36.0)
MCV: 103.5 fL — ABNORMAL HIGH (ref 78.0–100.0)
Monocytes Absolute: 1.3 10*3/uL — ABNORMAL HIGH (ref 0.1–1.0)
Monocytes Relative: 11 %
NEUTROS PCT: 80 %
Neutro Abs: 9.5 10*3/uL — ABNORMAL HIGH (ref 1.7–7.7)
PLATELETS: 168 10*3/uL (ref 150–400)
RBC: 4.05 MIL/uL — AB (ref 4.22–5.81)
RDW: 13.4 % (ref 11.5–15.5)
WBC: 12 10*3/uL — AB (ref 4.0–10.5)

## 2016-02-15 LAB — COMPREHENSIVE METABOLIC PANEL
ALBUMIN: 4.2 g/dL (ref 3.5–5.0)
ALK PHOS: 75 U/L (ref 38–126)
ALT: 31 U/L (ref 17–63)
ANION GAP: 10 (ref 5–15)
AST: 51 U/L — ABNORMAL HIGH (ref 15–41)
BUN: 11 mg/dL (ref 6–20)
CALCIUM: 9.6 mg/dL (ref 8.9–10.3)
CHLORIDE: 107 mmol/L (ref 101–111)
CO2: 22 mmol/L (ref 22–32)
CREATININE: 0.98 mg/dL (ref 0.61–1.24)
GFR calc non Af Amer: >60 mL/min (ref >60)
GLUCOSE: 112 mg/dL — AB (ref 65–99)
Potassium: 4.3 mmol/L (ref 3.5–5.1)
SODIUM: 139 mmol/L (ref 135–145)
Total Bilirubin: 0.6 mg/dL (ref 0.3–1.2)
Total Protein: 7.1 g/dL (ref 6.5–8.1)

## 2016-02-15 LAB — URINALYSIS, ROUTINE W REFLEX MICROSCOPIC
Bilirubin Urine: NEGATIVE
GLUCOSE, UA: NEGATIVE mg/dL
Hgb urine dipstick: NEGATIVE
KETONES UR: 15 mg/dL — AB
LEUKOCYTES UA: NEGATIVE
NITRITE: NEGATIVE
PH: 5 (ref 5.0–8.0)
Protein, ur: NEGATIVE mg/dL

## 2016-02-15 MED ORDER — ONDANSETRON HCL 4 MG/2ML IJ SOLN
4.0000 mg | Freq: Once | INTRAMUSCULAR | Status: AC
Start: 2016-02-15 — End: 2016-02-15
  Administered 2016-02-15: 4 mg via INTRAVENOUS
  Filled 2016-02-15: qty 2

## 2016-02-15 MED ORDER — SODIUM CHLORIDE 0.9 % IV SOLN: INTRAVENOUS | Status: DC

## 2016-02-15 MED ORDER — SODIUM CHLORIDE 0.9 % IV SOLN
INTRAVENOUS | Status: AC
  Administered 2016-02-15: 17:00:00 via INTRAVENOUS

## 2016-02-15 MED ORDER — DOCUSATE SODIUM 100 MG PO CAPS
100.0000 mg | ORAL_CAPSULE | Freq: Two times a day (BID) | ORAL | Status: DC
  Administered 2016-02-15 – 2016-02-23 (×15): 100 mg via ORAL
  Filled 2016-02-15 (×15): qty 1

## 2016-02-15 MED ORDER — MAGNESIUM HYDROXIDE 400 MG/5ML PO SUSP: 30.0000 mL | Freq: Every day | ORAL | Status: DC | PRN

## 2016-02-15 MED ORDER — POTASSIUM CHLORIDE IN NACL 20-0.9 MEQ/L-% IV SOLN
INTRAVENOUS | Status: DC
  Administered 2016-02-15: 21:00:00 via INTRAVENOUS
  Filled 2016-02-15 (×5): qty 1000

## 2016-02-15 MED ORDER — METOPROLOL SUCCINATE ER 25 MG PO TB24
25.0000 mg | ORAL_TABLET | Freq: Every day | ORAL | Status: DC
  Administered 2016-02-16 – 2016-02-23 (×8): 25 mg via ORAL
  Filled 2016-02-15 (×8): qty 1

## 2016-02-15 MED ORDER — ONDANSETRON HCL 4 MG/2ML IJ SOLN: 4.0000 mg | Freq: Four times a day (QID) | INTRAMUSCULAR | Status: DC | PRN

## 2016-02-15 MED ORDER — LAMOTRIGINE 100 MG PO TABS
200.0000 mg | ORAL_TABLET | Freq: Every morning | ORAL | Status: DC
  Administered 2016-02-16 – 2016-02-23 (×7): 200 mg via ORAL
  Filled 2016-02-15 (×7): qty 2

## 2016-02-15 MED ORDER — HYDROMORPHONE HCL 1 MG/ML IJ SOLN
1.0000 mg | INTRAMUSCULAR | Status: DC | PRN
  Administered 2016-02-15 – 2016-02-17 (×17): 1 mg via INTRAVENOUS
  Filled 2016-02-15 (×18): qty 1

## 2016-02-15 MED ORDER — BISACODYL 5 MG PO TBEC: 5.0000 mg | DELAYED_RELEASE_TABLET | Freq: Every day | ORAL | Status: DC | PRN

## 2016-02-15 MED ORDER — BREXPIPRAZOLE 1 MG PO TABS
1.0000 | ORAL_TABLET | Freq: Every day | ORAL | Status: DC
  Administered 2016-02-16 – 2016-02-23 (×7): 1 mg via ORAL
  Filled 2016-02-15 (×8): qty 1

## 2016-02-15 MED ORDER — IPRATROPIUM-ALBUTEROL 0.5-2.5 (3) MG/3ML IN SOLN
3.0000 mL | Freq: Once | RESPIRATORY_TRACT | Status: AC
  Administered 2016-02-15: 3 mL via RESPIRATORY_TRACT
  Filled 2016-02-15: qty 3

## 2016-02-15 MED ORDER — FENTANYL CITRATE (PF) 100 MCG/2ML IJ SOLN
50.0000 ug | Freq: Once | INTRAMUSCULAR | Status: AC
  Administered 2016-02-15: 50 ug via INTRAVENOUS
  Filled 2016-02-15: qty 2

## 2016-02-15 MED ORDER — ACETAMINOPHEN 650 MG RE SUPP: 650.0000 mg | Freq: Four times a day (QID) | RECTAL | Status: DC | PRN

## 2016-02-15 MED ORDER — METHOCARBAMOL 1000 MG/10ML IJ SOLN: 1000.0000 mg | Freq: Four times a day (QID) | INTRAMUSCULAR | Status: DC

## 2016-02-15 MED ORDER — OXYCODONE HCL 5 MG PO TABS
5.0000 mg | ORAL_TABLET | ORAL | Status: DC | PRN
  Administered 2016-02-15 – 2016-02-16 (×2): 10 mg via ORAL
  Administered 2016-02-16: 5 mg via ORAL
  Administered 2016-02-17 – 2016-02-23 (×16): 10 mg via ORAL
  Filled 2016-02-15 (×8): qty 2
  Filled 2016-02-15: qty 1
  Filled 2016-02-15 (×10): qty 2

## 2016-02-15 MED ORDER — HYDROMORPHONE HCL 1 MG/ML IJ SOLN
1.0000 mg | Freq: Once | INTRAMUSCULAR | Status: AC
  Administered 2016-02-15: 1 mg via INTRAVENOUS
  Filled 2016-02-15: qty 1

## 2016-02-15 MED ORDER — METHOCARBAMOL 500 MG PO TABS
1000.0000 mg | ORAL_TABLET | Freq: Four times a day (QID) | ORAL | Status: DC
  Administered 2016-02-15 – 2016-02-23 (×28): 1000 mg via ORAL
  Filled 2016-02-15 (×30): qty 2

## 2016-02-15 MED ORDER — TAMSULOSIN HCL 0.4 MG PO CAPS
0.4000 mg | ORAL_CAPSULE | Freq: Every day | ORAL | Status: DC
  Administered 2016-02-16 – 2016-02-23 (×7): 0.4 mg via ORAL
  Filled 2016-02-15 (×7): qty 1

## 2016-02-15 MED ORDER — ONDANSETRON HCL 4 MG PO TABS: 4.0000 mg | ORAL_TABLET | Freq: Four times a day (QID) | ORAL | Status: DC | PRN

## 2016-02-15 MED ORDER — OXYCODONE-ACETAMINOPHEN 5-325 MG PO TABS
1.0000 | ORAL_TABLET | Freq: Four times a day (QID) | ORAL | Status: DC | PRN
  Administered 2016-02-15 – 2016-02-17 (×5): 2 via ORAL
  Administered 2016-02-17: 1 via ORAL
  Administered 2016-02-17 – 2016-02-23 (×13): 2 via ORAL
  Filled 2016-02-15: qty 1
  Filled 2016-02-15 (×18): qty 2

## 2016-02-15 MED ORDER — ROSUVASTATIN CALCIUM 10 MG PO TABS
20.0000 mg | ORAL_TABLET | Freq: Every day | ORAL | Status: DC
  Administered 2016-02-15 – 2016-02-22 (×8): 20 mg via ORAL
  Filled 2016-02-15 (×8): qty 2

## 2016-02-15 MED ORDER — ZOLPIDEM TARTRATE 5 MG PO TABS
10.0000 mg | ORAL_TABLET | Freq: Every evening | ORAL | Status: DC | PRN
  Administered 2016-02-15 – 2016-02-22 (×7): 10 mg via ORAL
  Filled 2016-02-15 (×7): qty 2

## 2016-02-15 MED ORDER — ACETAMINOPHEN 325 MG PO TABS: 650.0000 mg | ORAL_TABLET | Freq: Four times a day (QID) | ORAL | Status: DC | PRN

## 2016-02-15 MED ORDER — DESVENLAFAXINE SUCCINATE ER 100 MG PO TB24
100.0000 mg | ORAL_TABLET | Freq: Every morning | ORAL | Status: DC
  Administered 2016-02-16 – 2016-02-23 (×7): 100 mg via ORAL
  Filled 2016-02-15 (×8): qty 1

## 2016-02-15 MED ORDER — LISINOPRIL 40 MG PO TABS
40.0000 mg | ORAL_TABLET | Freq: Every day | ORAL | Status: DC
  Administered 2016-02-16 – 2016-02-23 (×7): 40 mg via ORAL
  Filled 2016-02-15 (×7): qty 1

## 2016-02-15 MED ORDER — SODIUM CHLORIDE 0.9 % IV SOLN
INTRAVENOUS | Status: DC
  Administered 2016-02-15: 12:00:00 via INTRAVENOUS

## 2016-02-15 NOTE — H&P (Signed)
Orthopaedic Trauma Service H&P/Consult     Chief Complaint:  Left tibial plateau fracture  HPI:   Adrian Rivas is an 55 y.o. white male who sustained a fall this morning. patient believes he fell  Coming back from the bathroom. He landed on ceramic, floor. He went to [facility] for evaluation and was found to have a left tibial plateau fracture. Patient is established at [place]. They were contacted regarding his tibial plateau fracture but due to the severe injury patient was transferred to [place] to the orthopedic trauma service for admission as they felt his injury was beyond scope from their practice. Patient arrived to [place] around 1730. The patient was seen and evaluated by the orthopedic trauma service. He was only transferred in a knee immobilizer. No compressive dressings had been applied to the patient nor had any ice been placed on the patient as well. Patient complains of pain only in his left knee. Denies any additional injuries also. He is unable to clearly recall the exact mechanism that resulted in his injury.  Denies any numbness or tingling in left leg. No sensory changes to Left leg   Past Medical History:  Diagnosis Date  . Anxiety   . Arthritis   . COPD (chronic obstructive pulmonary disease) (HCC)    TOLD HE HAS COPD - QUIT SMOKING 15 YRS AGO-- NO C/O OF SOB  . Depression   . Dyslipidemia   . Pain    CHRONIC NECK PAIN- HX OF ANTERIOR FUSION  . Pain    LOWER BACK WITH PAIN DOWN BOTH LEGS - BUT WORSE LEFT LEG WITH NUMBNESS OUTER SIDE OF LEFT LEG - HAS SPINAL STENOSIS    Past Surgical History:  Procedure Laterality Date  . "SMALL BODIES REMOVED" FROM BOTH SHOULDERS    . BACK SURGERY     LUMBAR LAMINECTOMY  . BILATERAL CARPAL TUNNEL RELEASE    . CERVICAL FUSION    . LUMBAR LAMINECTOMY/DECOMPRESSION MICRODISCECTOMY N/A 10/20/2012   Procedure: DECOMPRESSION L2-L3, L1-L2;  Surgeon: Johnn Hai, MD;  Location: [facility];  Service:  Orthopedics;  Laterality: N/A;  . RIGHT ACL RECONSTRUCTION      History reviewed. No pertinent family history. Social History:  reports that he has quit smoking. His smoking use included Cigarettes. He has a 20.00 pack-year smoking history. He has never used smokeless tobacco. He reports that he does not drink alcohol or use drugs. Pt is retired/on disability due to chronic back issues    Allergies: No Known Allergies  Medications Prior to Admission  Medication Sig Dispense Refill  . cholecalciferol (VITAMIN D) 400 UNITS TABS Take 400 Units by mouth daily.    Marland Kitchen desvenlafaxine (PRISTIQ) 100 MG 24 hr tablet Take 100 mg by mouth every morning.    Marland Kitchen HYDROcodone-acetaminophen (NORCO) 10-325 MG tablet 1 tablet 3 (three) times daily as needed.  0  . lamoTRIgine (LAMICTAL) 100 MG tablet Take 200 mg by mouth every morning. TAKES 200 mg in the morning and '100mg'$  in the afternoon    . lisinopril (PRINIVIL,ZESTRIL) 40 MG tablet 1 tablet daily.  4  . methocarbamol (ROBAXIN) 500 MG tablet Take 1 tablet (500 mg total) by mouth 4 (four) times daily. (Patient taking differently: Take 500 mg by mouth 3 (three) times daily. ) 45 tablet 1  . metoprolol succinate (TOPROL-XL) 25 MG 24 hr tablet 1 tablet daily.  4  . mometasone (NASONEX) 50 MCG/ACT nasal spray Place 2 sprays into the nose daily  as needed.     . Multiple Vitamin (MULTIVITAMIN WITH MINERALS) TABS Take 1 tablet by mouth daily.    Marland Kitchen REXULTI 1 MG TABS 1 tablet daily.  5  . rosuvastatin (CRESTOR) 20 MG tablet Take 20 mg by mouth at bedtime.    . tamsulosin (FLOMAX) 0.4 MG CAPS capsule 1 capsule daily.  0  . zolpidem (AMBIEN) 10 MG tablet Take 10 mg by mouth at bedtime as needed for sleep.      Results for orders placed or performed during the hospital encounter of 02/15/16 (from the past 48 hour(s))  Urinalysis, Routine w reflex microscopic (not at [facility])     Status: Abnormal   Collection Time: 02/15/16 10:26 AM  Result Value Ref Range   Color,  Urine YELLOW YELLOW   APPearance CLEAR CLEAR   Specific Gravity, Urine >1.030 (H) 1.005 - 1.030   pH 5.0 5.0 - 8.0   Glucose, UA NEGATIVE NEGATIVE mg/dL   Hgb urine dipstick NEGATIVE NEGATIVE   Bilirubin Urine NEGATIVE NEGATIVE   Ketones, ur 15 (A) NEGATIVE mg/dL   Protein, ur NEGATIVE NEGATIVE mg/dL   Nitrite NEGATIVE NEGATIVE   Leukocytes, UA NEGATIVE NEGATIVE    Comment: MICROSCOPIC NOT DONE ON URINES WITH NEGATIVE PROTEIN, BLOOD, LEUKOCYTES, NITRITE, OR GLUCOSE <1000 mg/dL.  CBC with Differential/Platelet     Status: Abnormal   Collection Time: 02/15/16 10:34 AM  Result Value Ref Range   WBC 12.0 (H) 4.0 - 10.5 K/uL   RBC 4.05 (L) 4.22 - 5.81 MIL/uL   Hemoglobin 14.1 13.0 - 17.0 g/dL   HCT 41.9 39.0 - 52.0 %   MCV 103.5 (H) 78.0 - 100.0 fL   MCH 34.8 (H) 26.0 - 34.0 pg   MCHC 33.7 30.0 - 36.0 g/dL   RDW 13.4 11.5 - 15.5 %   Platelets 168 150 - 400 K/uL   Neutrophils Relative % 80 %   Neutro Abs 9.5 (H) 1.7 - 7.7 K/uL   Lymphocytes Relative 9 %   Lymphs Abs 1.1 0.7 - 4.0 K/uL   Monocytes Relative 11 %   Monocytes Absolute 1.3 (H) 0.1 - 1.0 K/uL   Eosinophils Relative 0 %   Eosinophils Absolute 0.0 0.0 - 0.7 K/uL   Basophils Relative 0 %   Basophils Absolute 0.0 0.0 - 0.1 K/uL  Comprehensive metabolic panel     Status: Abnormal   Collection Time: 02/15/16 10:34 AM  Result Value Ref Range   Sodium 139 135 - 145 mmol/L   Potassium 4.3 3.5 - 5.1 mmol/L   Chloride 107 101 - 111 mmol/L   CO2 22 22 - 32 mmol/L   Glucose, Bld 112 (H) 65 - 99 mg/dL   BUN 11 6 - 20 mg/dL   Creatinine, Ser 0.98 0.61 - 1.24 mg/dL   Calcium 9.6 8.9 - 10.3 mg/dL   Total Protein 7.1 6.5 - 8.1 g/dL   Albumin 4.2 3.5 - 5.0 g/dL   AST 51 (H) 15 - 41 U/L   ALT 31 17 - 63 U/L   Alkaline Phosphatase 75 38 - 126 U/L   Total Bilirubin 0.6 0.3 - 1.2 mg/dL   GFR calc non Af Amer >60 >60 mL/min   GFR calc Af Amer >60 >60 mL/min    Comment: (NOTE) The eGFR has been calculated using the CKD EPI  equation. This calculation has not been validated in all clinical situations. eGFR's persistently <60 mL/min signify possible Chronic Kidney Disease.    Anion gap 10 5 -  15   Dg Chest 1 View  Result Date: 02/15/2016 CLINICAL DATA:  Hypoxia.  Fall this morning EXAM: CHEST 1 VIEW COMPARISON:  10/13/2012 FINDINGS: Chronic interstitial coarsening. Patient has known emphysema. Mild interstitial crowding at the bases without convincing pneumonia. No edema, effusion, or pneumothorax. Normal heart size and mediastinal contours. IMPRESSION: 1. Mild basilar opacity, likely atelectasis. 2. Emphysema. Electronically Signed   By: Monte Fantasia M.D.   On: 02/15/2016 10:02   Dg Knee Complete 4 Views Left  Result Date: 02/15/2016 CLINICAL DATA:  [place] this morning in the bathroom. Severe pain and swelling. EXAM: LEFT KNEE - COMPLETE 4+ VIEW COMPARISON:  None. FINDINGS: Large lipohemarthrosis. No fracture of the femur or patella. Comminuted fracture of the proximal tibia, fragments displaced only a few mm. Slight depression of the main lateral fragment. No fibular fracture. IMPRESSION: Comminuted but minimally displaced proximal tibial fracture. Likely hemarthrosis. Electronically Signed   By: Nelson Chimes M.D.   On: 02/15/2016 10:02   Dg Knee Complete 4 Views Right  Result Date: 02/15/2016 CLINICAL DATA:  [place] in the bathroom today.  Pain. EXAM: RIGHT KNEE - COMPLETE 4+ VIEW COMPARISON:  None. FINDINGS: Previous tunneled ACL repair. No joint effusion. No significant degenerative change. No acute traumatic finding. IMPRESSION: Previous tunneled ACL repair.  No acute finding. Electronically Signed   By: Nelson Chimes M.D.   On: 02/15/2016 10:02    Review of Systems  Constitutional: Negative for chills and fever.  Respiratory: Negative for shortness of breath and wheezing.   Cardiovascular: Negative for chest pain and palpitations.  Gastrointestinal: Negative for abdominal pain, nausea and vomiting.   Musculoskeletal: Positive for joint pain (left knee pain ).  Neurological: Negative for tingling, sensory change and headaches.    Blood pressure (!) 142/76, pulse (!) 109, temperature 98.9 F (37.2 C), temperature source Oral, resp. rate 10, height '5\' 6"'$  (1.676 m), weight 113.4 kg (250 lb), SpO2 92 %. Physical Exam  Constitutional: He is oriented to person, place, and time. He appears well-developed and well-nourished. He is cooperative.  Obese white male. Older than stated age. Short stocky appearance, protuberant abdomen   Cardiovascular: Normal rate, regular rhythm, S1 normal and S2 normal.   Pulmonary/Chest: No respiratory distress.  Abdominal:  Protuberant, + BS, NT  Musculoskeletal:  Left Lower Extremity  Inspection:    Pt only in knee immobilizer, no compressive dressing of any kind   + swelling L knee and lower leg noted    Knee resting in flexed position  Bony eval:   TTP L proximal tibia    Hip and ankle nontender  Soft tissue:    Extensive swelling to Left lower leg    Skin along proximal tibia does not wrinkle with gentle compression     + ecchymosis and superficial abrasion to L knee     Large knee effusion     Ankle grossly stable     Knee stability not evaluated due to known fracture  ROM:    Not assessed  Sensation:   DPN, SPN, TN sensation grossly intact Motor:   EHL, FHL, AT, PT, peroneals, gastroc motor intact  Vascular:    Ext warm    + DP pulse    Compartments are full but compressible and nontender     No pain with passive stretching of his toes or ankle in flexion or extension    Right lower extremity            Mild ecchymosis around knee  and small abrasion to knee  Nontender  No effusions  Knee stable to varus/ valgus and anterior/posterior stress  Sens DPN, SPN, TN intact  Motor EHL, ext, flex, evers 5/5  DP 2+, PT 2+, No significant edema        Neurological: He is alert and oriented to person, place, and time.  Psychiatric: He has  a normal mood and affect. His speech is normal. Cognition and memory are normal.      Assessment/Plan  55 y/o male s/p fall with Left bicondylar tibial plateau fracture   -fall  - Left bicondylar tibial plateau fracture   Extensive soft tissue swelling present  Pt will be placed into bulky compressive dressing from foot to thigh to help further control swelliing  Aggressive cryotherapy   Elevate extremity to level of heart  NWB L leg    At current time would not anticipate definitive fixation for 7-10 days due to swelling   Pt will remain hospitalized to monitor for signs of compartment syndrome. pts compartments are full but they are compressible and he does not exhibit pain out of proportion with exam   Neurovascular checks q1h x 8 hours, then q4h  - Pain management:  Pt on chronic pain meds due to chronic back pain. Pain control may be challenging   Percocet, oxy IR and robaxin   - ABL anemia/Hemodynamics  Stable  - Medical issues   HTN, Depression    Home meds  - DVT/PE prophylaxis:  Will start lovenox once we feel he is beyond a time point for developing compartment syndrome.  Pt at risk for bleeding into his L leg compartments if pharmacologic anticoagulation started which could permit the evolution of a compartment syndrome   - Activity:  NWB Left leg   - FEN/GI prophylaxis/Foley/Lines:  Clear liquid diet for now  Will likely advance diet tomorrow after pt is re-evaluated    - Dispo:  Pt will be admitted to the ortho service for close observation, pain control and therapy evaluation    Jari Pigg, PA-C [facility] [phone] (P) 02/15/2016, 8:49 PM

## 2016-02-15 NOTE — Progress Notes (Signed)
Orthopedic Tech Progress Note Patient Details:  Kai Levinsimothy E Rhue [birth year] [id number] Assisted Dr. Carola FrostHandy and Montez MoritaKeith Paul with bulky jones compressive dressing. Ortho Devices Type of Ortho Device: Ace wrap Ortho Device/Splint Location: Bulky jones dressing applied to Lt Knee, Ace Ortho Device/Splint Interventions: Ordered, Application   Clois Dupesvery S Mario Coronado 02/15/2016, 8:37 PM

## 2016-02-15 NOTE — ED Provider Notes (Signed)
[facility]-EMERGENCY DEPT Provider Note   CSN: [id number] Arrival date & time: 02/15/16  4098  By signing my name below, I, Modena Jansky, attest that this documentation has been prepared under the direction and in the presence of Vanetta Mulders, MD . Electronically Signed: Modena Jansky, Scribe. 02/15/2016. 9:05 AM.  History   Chief Complaint Chief Complaint  Patient presents with  . Knee Pain   The history is provided by the patient. No language interpreter was used.  Knee Pain   This is a new problem. The current episode started 12 to 24 hours ago. The problem occurs constantly. The problem has not changed since onset.The pain is present in the right knee and left knee. The pain is moderate. The symptoms are aggravated by activity.   HPI Comments: Adrian Rivas is a 55 y.o. male with a hx of arthritis and COPD who presents to the Emergency Department complaining of a fall that occurred last night. Pt states he went to the bathroom, tripped, and fell onto Rivas knees. He denies any LOC or head injury. He reports associated symptoms of bilateral knee pain that is worse on the left. He describes the pain as constant, moderate, and exacerbated by movement. He denies any SOB, wheezing, use of inhalers, use of oxygen at home, or other injuries.     PCP: [facility]  Past Medical History:  Diagnosis Date  . Anxiety   . Arthritis   . COPD (chronic obstructive pulmonary disease) (HCC)    TOLD HE HAS COPD - QUIT SMOKING 15 YRS AGO-- NO C/O OF SOB  . Depression   . Dyslipidemia   . Pain    CHRONIC NECK PAIN- HX OF ANTERIOR FUSION  . Pain    LOWER BACK WITH PAIN DOWN Rivas LEGS - BUT WORSE LEFT LEG WITH NUMBNESS OUTER SIDE OF LEFT LEG - HAS SPINAL STENOSIS    Patient Active Problem List   Diagnosis Date Noted  . Closed tibia fracture 02/15/2016  . Spinal stenosis of lumbar region 10/20/2012  . PULMONARY NODULE 11/10/2007  . CHEST PAIN 11/10/2007    Past Surgical History:   Procedure Laterality Date  . "SMALL BODIES REMOVED" FROM Rivas SHOULDERS    . BACK SURGERY     LUMBAR LAMINECTOMY  . BILATERAL CARPAL TUNNEL RELEASE    . CERVICAL FUSION    . LUMBAR LAMINECTOMY/DECOMPRESSION MICRODISCECTOMY N/A 10/20/2012   Procedure: DECOMPRESSION L2-L3, L1-L2;  Surgeon: Javier Docker, MD;  Location: [facility];  Service: Orthopedics;  Laterality: N/A;  . RIGHT ACL RECONSTRUCTION         Home Medications    Prior to Admission medications   Medication Sig Start Date End Date Taking? Authorizing Provider  cholecalciferol (VITAMIN D) 400 UNITS TABS Take 400 Units by mouth daily.   Yes Historical Provider, MD  desvenlafaxine (PRISTIQ) 100 MG 24 hr tablet Take 100 mg by mouth every morning.   Yes Historical Provider, MD  HYDROcodone-acetaminophen (NORCO) 10-325 MG tablet 1 tablet 3 (three) times daily as needed. 02/04/16  Yes Historical Provider, MD  lamoTRIgine (LAMICTAL) 100 MG tablet Take 200 mg by mouth every morning. TAKES 200 mg in the morning and 100mg  in the afternoon   Yes Historical Provider, MD  lisinopril (PRINIVIL,ZESTRIL) 40 MG tablet 1 tablet daily. 01/30/16  Yes Historical Provider, MD  methocarbamol (ROBAXIN) 500 MG tablet Take 1 tablet (500 mg total) by mouth 4 (four) times daily. Patient taking differently: Take 500 mg by mouth 3 (three) times  daily.  10/20/12  Yes Dorothy Spark, PA-C  metoprolol succinate (TOPROL-XL) 25 MG 24 hr tablet 1 tablet daily. 02/04/16  Yes Historical Provider, MD  mometasone (NASONEX) 50 MCG/ACT nasal spray Place 2 sprays into the nose daily as needed.    Yes Historical Provider, MD  Multiple Vitamin (MULTIVITAMIN WITH MINERALS) TABS Take 1 tablet by mouth daily.   Yes Historical Provider, MD  REXULTI 1 MG TABS 1 tablet daily. 01/16/16  Yes Historical Provider, MD  rosuvastatin (CRESTOR) 20 MG tablet Take 20 mg by mouth at bedtime.   Yes Historical Provider, MD  tamsulosin (FLOMAX) 0.4 MG CAPS capsule 1 capsule daily. 01/30/16  Yes  Historical Provider, MD  zolpidem (AMBIEN) 10 MG tablet Take 10 mg by mouth at bedtime as needed for sleep.   Yes Historical Provider, MD    Family History History reviewed. No pertinent family history.  Social History Social History  Substance Use Topics  . Smoking status: Former Smoker    Packs/day: 1.00    Years: 20.00    Types: Cigarettes  . Smokeless tobacco: Never Used     Comment: QUIT SMOKING ABOUT 15 YRS AGO  . Alcohol use No     Allergies   Review of patient's allergies indicates no known allergies.   Review of Systems Review of Systems  Constitutional: Negative for chills and fever.  HENT: Negative for congestion, rhinorrhea and sore throat.   Eyes: Negative for visual disturbance.  Respiratory: Negative for cough, shortness of breath and wheezing.   Cardiovascular: Negative for chest pain and leg swelling.  Gastrointestinal: Negative for abdominal pain, diarrhea, nausea and vomiting.  Genitourinary: Negative for dysuria and hematuria.  Musculoskeletal: Positive for back pain and myalgias (Bilateral knee).  Skin: Negative for rash.  Neurological: Negative for syncope and headaches.  Hematological: Does not bruise/bleed easily.  Psychiatric/Behavioral: Negative for confusion.     Physical Exam Updated Vital Signs BP 128/81 (BP Location: Right Arm)   Pulse 98   Temp 99 F (37.2 C) (Oral)   Resp 15   Ht 5\' 6"  (1.676 m)   Wt 250 lb (113.4 kg)   SpO2 90%   BMI 40.35 kg/m   Physical Exam  Constitutional: He appears well-developed and well-nourished. No distress.  HENT:  Head: Normocephalic and atraumatic.  Mucous membranes are moist.   Eyes: Conjunctivae and EOM are normal. Pupils are equal, round, and reactive to light. No scleral icterus.  Neck: Neck supple.  Cardiovascular: Normal rate and regular rhythm.   No murmur heard. Pulmonary/Chest: Effort normal. No respiratory distress. He has no wheezes. He has no rales.  On 2L/min O2, pt's SpO2 is  currently 94%. On RA, pt's SpO2 is 87%.   Abdominal: Soft. Bowel sounds are normal. There is no tenderness.  Musculoskeletal: Normal range of motion. He exhibits no edema.  No swelling in the ankles.   Neurological: He is alert.  Skin: Skin is warm and dry.  Psychiatric: He has a normal mood and affect.  Nursing note and vitals reviewed.    ED Treatments / Results  DIAGNOSTIC STUDIES: Oxygen Saturation is 87% on 1L/min O2, normal by my interpretation.    COORDINATION OF CARE: 9:10 AM- Pt was put on 2L/min O2 and his SpO2 increased to 94%. Pt advised of plan for treatment and pt agrees.  Labs (all labs ordered are listed, but only abnormal results are displayed) Labs Reviewed  CBC WITH DIFFERENTIAL/PLATELET - Abnormal; Notable for the following:  Result Value   WBC 12.0 (*)    RBC 4.05 (*)    MCV 103.5 (*)    MCH 34.8 (*)    Neutro Abs 9.5 (*)    Monocytes Absolute 1.3 (*)    All other components within normal limits  COMPREHENSIVE METABOLIC PANEL - Abnormal; Notable for the following:    Glucose, Bld 112 (*)    AST 51 (*)    All other components within normal limits  URINALYSIS, ROUTINE W REFLEX MICROSCOPIC (NOT [facility]) - Abnormal; Notable for the following:    Specific Gravity, Urine >1.030 (*)    Ketones, ur 15 (*)    All other components within normal limits    EKG  EKG Interpretation  Date/Time:  Friday February 15 2016 08:54:25 EDT Ventricular Rate:  91 PR Interval:    QRS Duration: 94 QT Interval:  361 QTC Calculation: 445 R Axis:   76 Text Interpretation:  Sinus rhythm Confirmed by Deretha EmoryZACKOWSKI  MD, Mats Jeanlouis ([id number]) on 02/15/2016 8:56:18 AM       Radiology Dg Chest 1 View  Result Date: 02/15/2016 CLINICAL DATA:  Hypoxia.  Fall this morning EXAM: CHEST 1 VIEW COMPARISON:  10/13/2012 FINDINGS: Chronic interstitial coarsening. Patient has known emphysema. Mild interstitial crowding at the bases without convincing pneumonia. No edema, effusion, or  pneumothorax. Normal heart size and mediastinal contours. IMPRESSION: 1. Mild basilar opacity, likely atelectasis. 2. Emphysema. Electronically Signed   By: Marnee SpringJonathon  Watts M.D.   On: 02/15/2016 10:02   Dg Knee Complete 4 Views Left  Result Date: 02/15/2016 CLINICAL DATA:  Larey SeatFell this morning in the bathroom. Severe pain and swelling. EXAM: LEFT KNEE - COMPLETE 4+ VIEW COMPARISON:  None. FINDINGS: Large lipohemarthrosis. No fracture of the femur or patella. Comminuted fracture of the proximal tibia, fragments displaced only a few mm. Slight depression of the main lateral fragment. No fibular fracture. IMPRESSION: Comminuted but minimally displaced proximal tibial fracture. Likely hemarthrosis. Electronically Signed   By: Paulina FusiMark  Shogry M.D.   On: 02/15/2016 10:02   Dg Knee Complete 4 Views Right  Result Date: 02/15/2016 CLINICAL DATA:  Larey SeatFell in the bathroom today.  Pain. EXAM: RIGHT KNEE - COMPLETE 4+ VIEW COMPARISON:  None. FINDINGS: Previous tunneled ACL repair. No joint effusion. No significant degenerative change. No acute traumatic finding. IMPRESSION: Previous tunneled ACL repair.  No acute finding. Electronically Signed   By: Paulina FusiMark  Shogry M.D.   On: 02/15/2016 10:02    Procedures Procedures (including critical care time)  Medications Ordered in ED Medications  0.9 %  sodium chloride infusion ( Intravenous New Bag/Given 02/15/16 1222)  ipratropium-albuterol (DUONEB) 0.5-2.5 (3) MG/3ML nebulizer solution 3 mL (3 mLs Nebulization Given 02/15/16 0959)  ondansetron (ZOFRAN) injection 4 mg (4 mg Intravenous Given 02/15/16 1222)  fentaNYL (SUBLIMAZE) injection 50 mcg (50 mcg Intravenous Given 02/15/16 1222)  HYDROmorphone (DILAUDID) injection 1 mg (1 mg Intravenous Given 02/15/16 1346)     Initial Impression / Assessment and Plan / ED Course  I have reviewed the triage vital signs and the nursing notes.  Pertinent labs & imaging results that were available during my care of the patient were  reviewed by me and considered in my medical decision making (see chart for details).  Clinical Course    The patient was significant left proximal comminuted tibial fracture. Discussed with Dr. Charlann Boxerlin will have him transferred to Dr. Carola [facility]Handy service [facility]. He agrees with the knee immobilizer. Patient will remain nothing by mouth. They are  aware of his oxygen requirement on 2 L his sats are normal. Patient does have a history of COPD chest x-rays negative other labs without significant findings. Patient not on oxygen at home but most likely a COPD is gotten worse unknown to him and has oxygen requirement now.  Currently patient with good cap refill to the left toes. No evidence of any obvious compartment syndrome prior to the placement of the knee immobilizer.  Final Clinical Impressions(s) / ED Diagnoses   Final diagnoses:  Closed fracture of proximal end of left tibia, unspecified fracture morphology, initial encounter    New Prescriptions New Prescriptions   No medications on file   I personally performed the services described in this documentation, which was scribed in my presence. The recorded information has been reviewed and is accurate.      Vanetta Mulders, MD 02/15/16 1409

## 2016-02-15 NOTE — ED Triage Notes (Signed)
Per EMS: Pt went to bathroom last night and tripped and fell down on both knees, more pain in L knee.  Pt also has chronic low back pain.  Pt denies head trauma or LOC.  Pt alert and oriented at this time. Pt on 2L for 87%RA.

## 2016-02-15 NOTE — ED Notes (Signed)
Pt has abrasions to bilateral knees.

## 2016-02-15 NOTE — ED Notes (Signed)
[facility] called by Leotis ShamesLauren B. to [id number].

## 2016-02-15 NOTE — ED Notes (Addendum)
Per MD Deretha EmoryZackowski pt is able to take 2PM meds from home at this time. Pt is taking 40mg  Lisinopril and 25mg  Metoprolol.

## 2016-02-16 ENCOUNTER — Inpatient Hospital Stay (HOSPITAL_COMMUNITY): Payer: BLUE CROSS/BLUE SHIELD

## 2016-02-16 MED ORDER — ENOXAPARIN SODIUM 40 MG/0.4ML ~~LOC~~ SOLN
40.0000 mg | SUBCUTANEOUS | Status: DC
  Administered 2016-02-17 – 2016-02-18 (×2): 40 mg via SUBCUTANEOUS
  Filled 2016-02-16 (×2): qty 0.4

## 2016-02-16 NOTE — Progress Notes (Signed)
Orthopaedic Trauma Service Progress Note  Subjective  Doing ok  Pain controlled with IV meds No other complaints  Review of Systems  Constitutional: Negative for chills and fever.  Respiratory: Negative for shortness of breath and wheezing.   Cardiovascular: Negative for chest pain and palpitations.  Gastrointestinal: Negative for abdominal pain, nausea and vomiting.  Neurological: Negative for tingling and sensory change.     Objective   BP (!) 143/73 (BP Location: Left Arm)   Pulse 89   Temp 98.2 F (36.8 C) (Oral)   Resp 10   Ht 5\' 6"  (1.676 m)   Wt 113.4 kg (250 lb)   SpO2 95%   BMI 40.35 kg/m   Intake/Output      10/13 0701 - 10/14 0700 10/14 0701 - 10/15 0700   P.O. 300    I.V. (mL/kg) 1300 (11.5)    Total Intake(mL/kg) 1600 (14.1)    Urine (mL/kg/hr) 500    Total Output 500     Net +1100              Exam  Gen: lying in bed, NAD, Left leg is elevated  Lungs: clear anterior fields Cardiac: RRR, s1 and s2 Abd: + BS, NT, protuberant : Ext:       Left Lower extremity   Knee immobilizer and dressing are intact  Swelling appears stable with no further increase  No pain with passive stretching of toes or ankle in flexion or extension   Ext warm   + DP pulse   DPN, SPN, TN sensation grossly intact  EHL, FHL, AT, PT, peroneals, gastroc motor intact    Assessment and Plan   POD/HD#: 1  55 y/o male s/p ground level fall with Left tibial plateau fracture   - Fall  - L bicondylar tibial plateau fracture   No further evidence of evolving compartment syndrome  Continue with ice and elevation to level of heart  Keep on bed rest to get swelling under control   Ok to sit up in bed   Toe and ankle motion as tolerated     Will recheck soft tissue on Monday   Pt currently posted for OR Tuesday am. Condition of soft tissue will dictate procedure done   ORIF if swelling has subsided enough    Ex fix if swelling still significant    CT L knee   -  Pain management:  Continue with current regimen   Percocet   Oxy IR    Dilaudid  - ABL anemia/Hemodynamics  Stable   Blood pressures are good  Cbc in am   - Medical issues   HTN   Stable    Depression    Home meds   Chronic back pain   - DVT/PE prophylaxis:  SCD to R leg  Start lovenox tonight    - Metabolic Bone Disease:  Labs pending  Suspect some degree of poor bone quality due to chronic meds   - Activity:  Bed rest   - FEN/GI prophylaxis/Foley/Lines:  Advance to reg diet  Continue with IVF   Turn q2h and PRN for decub precautions    - Dispo:  Continue with soft tissue management- ice and elevation   OR Tuesday   Start therapy after OR     Mearl LatinKeith W. Maliq Pilley, PA-C [facility] [phone] (P) [phone] (O) 02/16/2016 11:01 AM

## 2016-02-17 LAB — CBC
HEMATOCRIT: 39.5 % (ref 39.0–52.0)
HEMOGLOBIN: 12.9 g/dL — AB (ref 13.0–17.0)
MCH: 34.4 pg — AB (ref 26.0–34.0)
MCHC: 32.7 g/dL (ref 30.0–36.0)
MCV: 105.3 fL — AB (ref 78.0–100.0)
Platelets: 156 10*3/uL (ref 150–400)
RBC: 3.75 MIL/uL — AB (ref 4.22–5.81)
RDW: 13.2 % (ref 11.5–15.5)
WBC: 9.3 10*3/uL (ref 4.0–10.5)

## 2016-02-17 LAB — COMPREHENSIVE METABOLIC PANEL
ALK PHOS: 66 U/L (ref 38–126)
ALT: 20 U/L (ref 17–63)
ANION GAP: 8 (ref 5–15)
AST: 26 U/L (ref 15–41)
Albumin: 3.3 g/dL — ABNORMAL LOW (ref 3.5–5.0)
BILIRUBIN TOTAL: 1.1 mg/dL (ref 0.3–1.2)
BUN: 7 mg/dL (ref 6–20)
CALCIUM: 8.8 mg/dL — AB (ref 8.9–10.3)
CO2: 27 mmol/L (ref 22–32)
CREATININE: 0.78 mg/dL (ref 0.61–1.24)
Chloride: 103 mmol/L (ref 101–111)
Glucose, Bld: 105 mg/dL — ABNORMAL HIGH (ref 65–99)
Potassium: 3.8 mmol/L (ref 3.5–5.1)
Sodium: 138 mmol/L (ref 135–145)
TOTAL PROTEIN: 6.7 g/dL (ref 6.5–8.1)

## 2016-02-17 MED ORDER — HYDROMORPHONE HCL 1 MG/ML IJ SOLN
1.0000 mg | INTRAMUSCULAR | Status: DC | PRN
  Administered 2016-02-17 – 2016-02-18 (×3): 1 mg via INTRAVENOUS
  Filled 2016-02-17 (×3): qty 1

## 2016-02-17 NOTE — Progress Notes (Signed)
Orthopaedic Trauma Service Progress Note  Subjective  Doing ok  Pain controlled with dilaudid  States he had a restless night b/c he didn't take his ambien last night. Takes ambien daily   No chest pain, SOB No N/V   ROS As above  Objective   BP 140/79 (BP Location: Left Arm)   Pulse 93   Temp 98.7 F (37.1 C) (Oral)   Resp 18   Ht '5\' 6"'$  (1.676 m)   Wt 113.4 kg (250 lb)   SpO2 93%   BMI 40.35 kg/m   Intake/Output      10/14 0701 - 10/15 0700 10/15 0701 - 10/16 0700   P.O.     I.V. (mL/kg)     Total Intake(mL/kg)     Urine (mL/kg/hr) 100 (0) 100 (0.2)   Total Output 100 100   Net -100 -100          Labs Results for RADFORD, PEASE (MRN [id number]) as of 02/17/2016 11:27  Ref. Range 02/17/2016 03:55  Sodium Latest Ref Range: 135 - 145 mmol/L 138  Potassium Latest Ref Range: 3.5 - 5.1 mmol/L 3.8  Chloride Latest Ref Range: 101 - 111 mmol/L 103  CO2 Latest Ref Range: 22 - 32 mmol/L 27  BUN Latest Ref Range: 6 - 20 mg/dL 7  Creatinine Latest Ref Range: 0.61 - 1.24 mg/dL 0.78  Calcium Latest Ref Range: 8.9 - 10.3 mg/dL 8.8 (L)  EGFR (Non-African Amer.) Latest Ref Range: >60 mL/min >60  EGFR (African American) Latest Ref Range: >60 mL/min >60  Glucose Latest Ref Range: 65 - 99 mg/dL 105 (H)  Anion gap Latest Ref Range: 5 - 15  8  Alkaline Phosphatase Latest Ref Range: 38 - 126 U/L 66  Albumin Latest Ref Range: 3.5 - 5.0 g/dL 3.3 (L)  AST Latest Ref Range: 15 - 41 U/L 26  ALT Latest Ref Range: 17 - 63 U/L 20  Total Protein Latest Ref Range: 6.5 - 8.1 g/dL 6.7  Total Bilirubin Latest Ref Range: 0.3 - 1.2 mg/dL 1.1  WBC Latest Ref Range: 4.0 - 10.5 K/uL 9.3  RBC Latest Ref Range: 4.22 - 5.81 MIL/uL 3.75 (L)  Hemoglobin Latest Ref Range: 13.0 - 17.0 g/dL 12.9 (L)  HCT Latest Ref Range: 39.0 - 52.0 % 39.5  MCV Latest Ref Range: 78.0 - 100.0 fL 105.3 (H)  MCH Latest Ref Range: 26.0 - 34.0 pg 34.4 (H)  MCHC Latest Ref Range: 30.0 - 36.0 g/dL 32.7  RDW Latest Ref  Range: 11.5 - 15.5 % 13.2  Platelets Latest Ref Range: 150 - 400 K/uL 156   Exam  Gen: resting comfortably in bed, NAD Lungs: CTA Cardiac: RRR, s1 and s2 Abd: +BS, NT, protuberant  Ext:       Left Lower Extremity   Knee immobilizer and dressing are intact             Swelling does not appear to be improved all that much but it is certainly no worse. I did not remove ace wraps at this time. Will remove tomorrow for better soft tissue assessment              No pain with passive stretching of toes or ankle in flexion or extension              Ext warm              + DP pulse  DPN, SPN, TN sensation grossly intact             EHL, FHL, AT, PT, peroneals, gastroc motor intact    Imaging  CT reviewed    Bicondylar tibial plateau fracture with shortening   fx lines look sclerotic   Assessment and Plan   POD/HD#: 2  55 y/o male s/p ground level fall with Left tibial plateau fracture    - Fall   - L bicondylar tibial plateau fracture              NWB L leg              Continue with ice and elevation to level of heart            continue bed rest, decubitus precautions    Turn q2h and float heels             Ok to sit up in bed              Toe and ankle motion as tolerated                           Will recheck soft tissue on Monday              Pt currently posted for OR Tuesday am. Condition of soft tissue will dictate procedure done                         ORIF if swelling has subsided enough                          Ex fix if swelling still significant                CT L knee reviewed    - Pain management:             Continue with current regimen                         Percocet                         Oxy IR                          Dilaudid   Use oral meds as primary. If unrelenting breakthrough pain after all PO meds given is present then may use IV meds    Pt on chronic opioids and ambien PTA    - ABL anemia/Hemodynamics             Stable               Blood pressures are good             Cbc in am    - Medical issues              HTN                         Stable                Depression                          Home meds  Chronic back pain    Pt on chronic opioids and ambien PTA    ?COPD   ? If pts underlying issue is sleep apnea   Pt on ambien daily to help sleep    Pt has truncal obesity with short, wide neck     Overnight he did have o2 sats 90-91% on 2L    - DVT/PE prophylaxis:             SCD to R leg             Lovenox    - Metabolic Bone Disease:             Labs pending             Suspect some degree of poor bone quality due to chronic meds    - Activity:             Bed rest    - FEN/GI prophylaxis/Foley/Lines:             Advance to reg diet             Continue with IVF               Turn q2h and PRN for decub precautions      - Dispo:             Continue with soft tissue management- ice and elevation              OR Tuesday              Start therapy after OR      Jari Pigg, PA-C [facility] [phone] ([phone] (O) 02/17/2016 11:27 AM

## 2016-02-17 NOTE — Progress Notes (Addendum)
Patient educated on pain medications. Patient focused on receiving IV Dilaudid every two hours. RN explained Dilaudid is for severe breakthrough pain only and other pain medications are prescribed. Patient will try other forms of pain medication along with cold therapy. Dilaudid will be given if other medications does not work.

## 2016-02-18 ENCOUNTER — Encounter (HOSPITAL_COMMUNITY): Payer: Self-pay | Admitting: Anesthesiology

## 2016-02-18 LAB — BASIC METABOLIC PANEL
Anion gap: 10 (ref 5–15)
BUN: 10 mg/dL (ref 6–20)
CALCIUM: 9 mg/dL (ref 8.9–10.3)
CO2: 25 mmol/L (ref 22–32)
CREATININE: 0.89 mg/dL (ref 0.61–1.24)
Chloride: 103 mmol/L (ref 101–111)
GFR calc Af Amer: >60 mL/min (ref >60)
GLUCOSE: 106 mg/dL — AB (ref 65–99)
Potassium: 3.6 mmol/L (ref 3.5–5.1)
SODIUM: 138 mmol/L (ref 135–145)

## 2016-02-18 LAB — CBC
HCT: 42.2 % (ref 39.0–52.0)
Hemoglobin: 13.6 g/dL (ref 13.0–17.0)
MCH: 33.9 pg (ref 26.0–34.0)
MCHC: 32.2 g/dL (ref 30.0–36.0)
MCV: 105.2 fL — AB (ref 78.0–100.0)
PLATELETS: 150 10*3/uL (ref 150–400)
RBC: 4.01 MIL/uL — AB (ref 4.22–5.81)
RDW: 13.1 % (ref 11.5–15.5)
WBC: 7.7 10*3/uL (ref 4.0–10.5)

## 2016-02-18 MED ORDER — CEFAZOLIN SODIUM-DEXTROSE 2-4 GM/100ML-% IV SOLN
2.0000 g | INTRAVENOUS | Status: AC
  Administered 2016-02-19: 2 g via INTRAVENOUS
  Filled 2016-02-18 (×2): qty 100

## 2016-02-18 NOTE — Anesthesia Preprocedure Evaluation (Addendum)
Anesthesia Evaluation  Patient identified by MRN, date of birth, ID band Patient awake    Reviewed: Allergy & Precautions, NPO status , Patient's Chart, lab work & pertinent test results, reviewed documented beta blocker date and time   Airway Mallampati: III       Dental no notable dental hx. (+) Teeth Intact   Pulmonary COPD, former smoker,    Pulmonary exam normal breath sounds clear to auscultation       Cardiovascular hypertension, Pt. on medications and Pt. on home beta blockers Normal cardiovascular exam Rhythm:Regular Rate:Normal     Neuro/Psych PSYCHIATRIC DISORDERS Anxiety Depression negative neurological ROS     GI/Hepatic   Endo/Other  diabetes, Well Controlled, Type 2, Oral Hypoglycemic AgentsMorbid obesityHyperlipidemia  Renal/GU   negative genitourinary   Musculoskeletal  (+) Arthritis , Osteoarthritis,  Lumbar spinal stenosis Left tibial plateau Fx- possible compartment syndrome   Abdominal (+) + obese,   Peds  Hematology negative hematology ROS (+)   Anesthesia Other Findings   Reproductive/Obstetrics                             Chemistry      Component Value Date/Time   NA 138 02/18/2016 0232   K 3.6 02/18/2016 0232   CL 103 02/18/2016 0232   CO2 25 02/18/2016 0232   BUN 10 02/18/2016 0232   CREATININE 0.89 02/18/2016 0232      Component Value Date/Time   CALCIUM 9.0 02/18/2016 0232   ALKPHOS 66 02/17/2016 0355   AST 26 02/17/2016 0355   ALT 20 02/17/2016 0355   BILITOT 1.1 02/17/2016 0355     Lab Results  Component Value Date   WBC 7.7 02/18/2016   HGB 13.6 02/18/2016   HCT 42.2 02/18/2016   MCV 105.2 (H) 02/18/2016   PLT 150 02/18/2016    EKG: normal EKG, normal sinus rhythm, unchanged from previous tracings.  Anesthesia Physical Anesthesia Plan  ASA: III  Anesthesia Plan: General   Post-op Pain Management:    Induction: Intravenous, Rapid  sequence and Cricoid pressure planned  Airway Management Planned: Oral ETT  Additional Equipment:   Intra-op Plan:   Post-operative Plan: Extubation in OR  Informed Consent: I have reviewed the patients History and Physical, chart, labs and discussed the procedure including the risks, benefits and alternatives for the proposed anesthesia with the patient or authorized representative who has indicated his/her understanding and acceptance.   Dental advisory given  Plan Discussed with: Anesthesiologist, CRNA and Surgeon  Anesthesia Plan Comments:         Anesthesia Quick Evaluation

## 2016-02-18 NOTE — Progress Notes (Signed)
Orthopaedic Trauma Service Progress Note  Subjective  Doing ok  Pain control improved with percocet and Oxy IR Slept better last night- did take an ambien No CP or SOB No Abd pain   Thinks he may want to try to get to [facility] later   + flatus No BM  ROS  As above   Objective   BP (!) 162/98 (BP Location: Left Arm)   Pulse 96   Temp 98.4 F (36.9 C) (Oral)   Resp 18   Ht '5\' 6"'$  (1.676 m)   Wt 113.4 kg (250 lb)   SpO2 94%   BMI 40.35 kg/m   Intake/Output      10/15 0701 - 10/16 0700 10/16 0701 - 10/17 0700   P.O. 360    I.V. (mL/kg) 825 (7.3)    Total Intake(mL/kg) 1185 (10.4)    Urine (mL/kg/hr) 100 (0)    Total Output 100     Net +1085            Labs  Results for DIMITRIY, CARRERAS (MRN [id number]) as of 02/18/2016 11:14  Ref. Range 02/18/2016 02:32  Sodium Latest Ref Range: 135 - 145 mmol/L 138  Potassium Latest Ref Range: 3.5 - 5.1 mmol/L 3.6  Chloride Latest Ref Range: 101 - 111 mmol/L 103  CO2 Latest Ref Range: 22 - 32 mmol/L 25  BUN Latest Ref Range: 6 - 20 mg/dL 10  Creatinine Latest Ref Range: 0.61 - 1.24 mg/dL 0.89  Calcium Latest Ref Range: 8.9 - 10.3 mg/dL 9.0  EGFR (Non-African Amer.) Latest Ref Range: >60 mL/min >60  EGFR (African American) Latest Ref Range: >60 mL/min >60  Glucose Latest Ref Range: 65 - 99 mg/dL 106 (H)  Anion gap Latest Ref Range: 5 - 15  10  WBC Latest Ref Range: 4.0 - 10.5 K/uL 7.7  RBC Latest Ref Range: 4.22 - 5.81 MIL/uL 4.01 (L)  Hemoglobin Latest Ref Range: 13.0 - 17.0 g/dL 13.6  HCT Latest Ref Range: 39.0 - 52.0 % 42.2  MCV Latest Ref Range: 78.0 - 100.0 fL 105.2 (H)  MCH Latest Ref Range: 26.0 - 34.0 pg 33.9  MCHC Latest Ref Range: 30.0 - 36.0 g/dL 32.2  RDW Latest Ref Range: 11.5 - 15.5 % 13.1  Platelets Latest Ref Range: 150 - 400 K/uL 150    Exam  Gen: resting comfortably in bed, NAD Lungs: CTA Cardiac: RRR, s1 and s2 Abd: +BS, NT, protuberant  Ext:       Left Lower Extremity              Knee immobilizer  and dressing are intact             parted dressing around proximal tibia, minimal wrinkling but swelling does appear improved              Ext warm              + DP pulse              DPN, SPN, TN sensation grossly intact             EHL, FHL, AT, PT, peroneals, gastroc motor intact      Assessment and Plan   POD/HD#: 12  55 y/o male s/p ground level fall with Left tibial plateau fracture    - Fall   - L bicondylar tibial plateau fracture              NWB L leg  Continue with ice and elevation to level of heart            continue bed rest, decubitus precautions                          Turn q2h and float heels             Ok to sit up in bed   Ok to try to use bed side commode with assistance   Strap down immobilizer so it is fitting snug during transfer               Toe and ankle motion as tolerated             Soft tissue swelling is marginal but do believe we will be able to perform definitive fixation tomorrow       - Pain management:             Continue with current regimen                         Percocet                         Oxy IR                          Dilaudid               Use oral meds as primary. If unrelenting breakthrough pain after all PO meds given is present then may use IV meds                Pt on chronic opioids and ambien PTA    - ABL anemia/Hemodynamics             Stable              Blood pressures a little elevate this am   Monitor              Cbc in am    - Medical issues              HTN                         Stable                Depression                          Home meds               Chronic back pain                          Pt on chronic opioids and ambien PTA                ?COPD                         ? If pts underlying issue is sleep apnea                         Pt on ambien daily to help sleep  Pt has truncal obesity with short, wide neck                             Overnight he did have o2 sats 90-91% on 2L    - DVT/PE prophylaxis:             SCD to R leg             Lovenox   lovenox x 21 days post op   - Metabolic Bone Disease:             Labs pending             Suspect some degree of poor bone quality due to chronic meds    - Activity:             Bed rest with BR privileges    - FEN/GI prophylaxis/Foley/Lines:             NPO after MN              Continue with IVF               Turn q2h and PRN for decub precautions      - Dispo:             OR tomorrow for ORIF Left tibial plateau    Jari Pigg, PA-C [facility] [phone] ([phone] (O) 02/18/2016 11:13 AM

## 2016-02-19 ENCOUNTER — Inpatient Hospital Stay (HOSPITAL_COMMUNITY): Payer: BLUE CROSS/BLUE SHIELD

## 2016-02-19 ENCOUNTER — Encounter (HOSPITAL_COMMUNITY): Payer: Self-pay

## 2016-02-19 ENCOUNTER — Inpatient Hospital Stay (HOSPITAL_COMMUNITY): Payer: BLUE CROSS/BLUE SHIELD | Admitting: Anesthesiology

## 2016-02-19 ENCOUNTER — Encounter (HOSPITAL_COMMUNITY)
Admission: EM | Disposition: A | Discharge status: Rehabilitation Facility | Payer: Self-pay | Source: Home / Self Care | Attending: Orthopedic Surgery

## 2016-02-19 HISTORY — PX: ORIF TIBIA PLATEAU: SHX2132

## 2016-02-19 HISTORY — PX: FASCIOTOMY: SHX132

## 2016-02-19 LAB — APTT: aPTT: 30 seconds (ref 24–36)

## 2016-02-19 LAB — COMPREHENSIVE METABOLIC PANEL
ALK PHOS: 69 U/L (ref 38–126)
ALT: 17 U/L (ref 17–63)
AST: 26 U/L (ref 15–41)
Albumin: 3.1 g/dL — ABNORMAL LOW (ref 3.5–5.0)
Anion gap: 8 (ref 5–15)
BUN: 6 mg/dL (ref 6–20)
CALCIUM: 8.8 mg/dL — AB (ref 8.9–10.3)
CHLORIDE: 106 mmol/L (ref 101–111)
CO2: 26 mmol/L (ref 22–32)
CREATININE: 0.76 mg/dL (ref 0.61–1.24)
GFR calc Af Amer: >60 mL/min (ref >60)
GFR calc non Af Amer: >60 mL/min (ref >60)
Glucose, Bld: 112 mg/dL — ABNORMAL HIGH (ref 65–99)
Potassium: 3.8 mmol/L (ref 3.5–5.1)
SODIUM: 140 mmol/L (ref 135–145)
Total Bilirubin: 1 mg/dL (ref 0.3–1.2)
Total Protein: 6.4 g/dL — ABNORMAL LOW (ref 6.5–8.1)

## 2016-02-19 LAB — CBC
HEMATOCRIT: 40 % (ref 39.0–52.0)
HEMATOCRIT: 39.7 % (ref 39.0–52.0)
HEMOGLOBIN: 13.4 g/dL (ref 13.0–17.0)
HEMOGLOBIN: 13.2 g/dL (ref 13.0–17.0)
MCH: 34 pg (ref 26.0–34.0)
MCH: 34.3 pg — ABNORMAL HIGH (ref 26.0–34.0)
MCHC: 33.5 g/dL (ref 30.0–36.0)
MCHC: 33.2 g/dL (ref 30.0–36.0)
MCV: 101.5 fL — ABNORMAL HIGH (ref 78.0–100.0)
MCV: 103.1 fL — AB (ref 78.0–100.0)
Platelets: 198 10*3/uL (ref 150–400)
Platelets: 200 10*3/uL (ref 150–400)
RBC: 3.94 MIL/uL — AB (ref 4.22–5.81)
RBC: 3.85 MIL/uL — AB (ref 4.22–5.81)
RDW: 12.7 % (ref 11.5–15.5)
RDW: 12.8 % (ref 11.5–15.5)
WBC: 7.5 10*3/uL (ref 4.0–10.5)
WBC: 8.8 10*3/uL (ref 4.0–10.5)

## 2016-02-19 LAB — CREATININE, SERUM: Creatinine, Ser: 0.85 mg/dL (ref 0.61–1.24)

## 2016-02-19 LAB — ABO/RH: ABO/RH(D): O POS

## 2016-02-19 LAB — SURGICAL PCR SCREEN
MRSA, PCR: NEGATIVE
Staphylococcus aureus: NEGATIVE

## 2016-02-19 LAB — TYPE AND SCREEN
ABO/RH(D): O POS
ANTIBODY SCREEN: NEGATIVE

## 2016-02-19 LAB — SEX HORMONE BINDING GLOBULIN: SEX HORMONE BINDING: 35.7 nmol/L (ref 19.3–76.4)

## 2016-02-19 LAB — TESTOSTERONE, FREE: Testosterone, Free: 4.1 pg/mL — ABNORMAL LOW (ref 7.2–24.0)

## 2016-02-19 LAB — TESTOSTERONE: Testosterone: 311 ng/dL (ref 264–916)

## 2016-02-19 LAB — PROTIME-INR
INR: 1.03
Prothrombin Time: 13.5 seconds (ref 11.4–15.2)

## 2016-02-19 LAB — CALCITRIOL (1,25 DI-OH VIT D): Vit D, 1,25-Dihydroxy: 63.7 pg/mL (ref 19.9–79.3)

## 2016-02-19 SURGERY — OPEN REDUCTION INTERNAL FIXATION (ORIF) TIBIAL PLATEAU
Anesthesia: General | Site: Leg Lower | Laterality: Left

## 2016-02-19 MED ORDER — FENTANYL CITRATE (PF) 100 MCG/2ML IJ SOLN
INTRAMUSCULAR | Status: DC | PRN
  Administered 2016-02-19: 200 ug via INTRAVENOUS
  Administered 2016-02-19: 100 ug via INTRAVENOUS

## 2016-02-19 MED ORDER — FENTANYL CITRATE (PF) 100 MCG/2ML IJ SOLN
INTRAMUSCULAR | Status: AC
  Filled 2016-02-19: qty 4

## 2016-02-19 MED ORDER — ENOXAPARIN SODIUM 40 MG/0.4ML ~~LOC~~ SOLN
40.0000 mg | SUBCUTANEOUS | Status: DC
  Administered 2016-02-20 – 2016-02-23 (×4): 40 mg via SUBCUTANEOUS
  Filled 2016-02-19 (×4): qty 0.4

## 2016-02-19 MED ORDER — FENTANYL CITRATE (PF) 100 MCG/2ML IJ SOLN
50.0000 ug | Freq: Once | INTRAMUSCULAR | Status: AC
  Administered 2016-02-19: 50 ug via INTRAVENOUS

## 2016-02-19 MED ORDER — PHENYLEPHRINE HCL 10 MG/ML IJ SOLN
INTRAVENOUS | Status: DC | PRN
  Administered 2016-02-19: 50 ug/min via INTRAVENOUS

## 2016-02-19 MED ORDER — CEFAZOLIN IN D5W 1 GM/50ML IV SOLN
1.0000 g | Freq: Four times a day (QID) | INTRAVENOUS | Status: AC
  Administered 2016-02-19 – 2016-02-20 (×3): 1 g via INTRAVENOUS
  Filled 2016-02-19 (×4): qty 50

## 2016-02-19 MED ORDER — METOCLOPRAMIDE HCL 5 MG PO TABS: 5.0000 mg | ORAL_TABLET | Freq: Three times a day (TID) | ORAL | Status: DC | PRN

## 2016-02-19 MED ORDER — SUGAMMADEX SODIUM 500 MG/5ML IV SOLN
INTRAVENOUS | Status: DC | PRN
  Administered 2016-02-19: 200 mg via INTRAVENOUS

## 2016-02-19 MED ORDER — FENTANYL CITRATE (PF) 100 MCG/2ML IJ SOLN
INTRAMUSCULAR | Status: AC
  Administered 2016-02-19: 50 ug via INTRAVENOUS
  Filled 2016-02-19: qty 2

## 2016-02-19 MED ORDER — ONDANSETRON HCL 4 MG PO TABS: 4.0000 mg | ORAL_TABLET | Freq: Four times a day (QID) | ORAL | Status: DC | PRN

## 2016-02-19 MED ORDER — ROCURONIUM BROMIDE 100 MG/10ML IV SOLN
INTRAVENOUS | Status: DC | PRN
  Administered 2016-02-19 (×2): 20 mg via INTRAVENOUS
  Administered 2016-02-19: 50 mg via INTRAVENOUS

## 2016-02-19 MED ORDER — PHENYLEPHRINE 40 MCG/ML (10ML) SYRINGE FOR IV PUSH (FOR BLOOD PRESSURE SUPPORT)
PREFILLED_SYRINGE | INTRAVENOUS | Status: AC
  Filled 2016-02-19: qty 10

## 2016-02-19 MED ORDER — LIDOCAINE HCL (CARDIAC) 20 MG/ML IV SOLN
INTRAVENOUS | Status: DC | PRN
  Administered 2016-02-19: 100 mg via INTRATRACHEAL

## 2016-02-19 MED ORDER — ACETAMINOPHEN 650 MG RE SUPP: 650.0000 mg | Freq: Four times a day (QID) | RECTAL | Status: DC | PRN

## 2016-02-19 MED ORDER — ONDANSETRON HCL 4 MG/2ML IJ SOLN: 4.0000 mg | Freq: Four times a day (QID) | INTRAMUSCULAR | Status: DC | PRN

## 2016-02-19 MED ORDER — PROPOFOL 10 MG/ML IV BOLUS
INTRAVENOUS | Status: AC
  Filled 2016-02-19: qty 20

## 2016-02-19 MED ORDER — HYDROMORPHONE HCL 1 MG/ML IJ SOLN
0.2500 mg | INTRAMUSCULAR | Status: DC | PRN
  Administered 2016-02-19: 0.5 mg via INTRAVENOUS
  Administered 2016-02-19: 0.25 mg via INTRAVENOUS
  Administered 2016-02-19: 0.5 mg via INTRAVENOUS
  Administered 2016-02-19 (×2): 0.25 mg via INTRAVENOUS

## 2016-02-19 MED ORDER — SUCCINYLCHOLINE CHLORIDE 20 MG/ML IJ SOLN
INTRAMUSCULAR | Status: DC | PRN
  Administered 2016-02-19: 120 mg via INTRAVENOUS

## 2016-02-19 MED ORDER — FENTANYL CITRATE (PF) 100 MCG/2ML IJ SOLN
INTRAMUSCULAR | Status: AC
  Filled 2016-02-19: qty 2

## 2016-02-19 MED ORDER — EPHEDRINE SULFATE 50 MG/ML IJ SOLN
INTRAMUSCULAR | Status: DC | PRN
  Administered 2016-02-19 (×2): 10 mg via INTRAVENOUS
  Administered 2016-02-19: 5 mg via INTRAVENOUS

## 2016-02-19 MED ORDER — CEFAZOLIN SODIUM 1 G IJ SOLR
INTRAMUSCULAR | Status: AC
  Filled 2016-02-19: qty 20

## 2016-02-19 MED ORDER — MIDAZOLAM HCL 5 MG/5ML IJ SOLN
INTRAMUSCULAR | Status: DC | PRN
  Administered 2016-02-19: 2 mg via INTRAVENOUS

## 2016-02-19 MED ORDER — ACETAMINOPHEN 325 MG PO TABS: 650.0000 mg | ORAL_TABLET | Freq: Four times a day (QID) | ORAL | Status: DC | PRN

## 2016-02-19 MED ORDER — SUGAMMADEX SODIUM 200 MG/2ML IV SOLN
INTRAVENOUS | Status: AC
  Filled 2016-02-19: qty 2

## 2016-02-19 MED ORDER — ROCURONIUM BROMIDE 10 MG/ML (PF) SYRINGE
PREFILLED_SYRINGE | INTRAVENOUS | Status: AC
  Filled 2016-02-19: qty 10

## 2016-02-19 MED ORDER — WARFARIN - PHARMACIST DOSING INPATIENT
Freq: Every day | Status: DC
  Administered 2016-02-20: 18:00:00

## 2016-02-19 MED ORDER — OXYCODONE HCL 5 MG PO TABS
ORAL_TABLET | ORAL | Status: AC
  Filled 2016-02-19: qty 2

## 2016-02-19 MED ORDER — PHENYLEPHRINE HCL 10 MG/ML IJ SOLN
INTRAMUSCULAR | Status: DC | PRN
  Administered 2016-02-19: 120 ug via INTRAVENOUS
  Administered 2016-02-19 (×2): 80 ug via INTRAVENOUS
  Administered 2016-02-19: 120 ug via INTRAVENOUS

## 2016-02-19 MED ORDER — WARFARIN SODIUM 7.5 MG PO TABS
7.5000 mg | ORAL_TABLET | Freq: Once | ORAL | Status: AC
  Administered 2016-02-19: 7.5 mg via ORAL
  Filled 2016-02-19: qty 1

## 2016-02-19 MED ORDER — LIDOCAINE 2% (20 MG/ML) 5 ML SYRINGE
INTRAMUSCULAR | Status: AC
  Filled 2016-02-19: qty 5

## 2016-02-19 MED ORDER — METOCLOPRAMIDE HCL 5 MG/ML IJ SOLN: 5.0000 mg | Freq: Three times a day (TID) | INTRAMUSCULAR | Status: DC | PRN

## 2016-02-19 MED ORDER — POTASSIUM CHLORIDE IN NACL 20-0.9 MEQ/L-% IV SOLN
INTRAVENOUS | Status: DC
  Administered 2016-02-19: 16:00:00 via INTRAVENOUS
  Filled 2016-02-19: qty 1000

## 2016-02-19 MED ORDER — HYDROMORPHONE HCL 1 MG/ML IJ SOLN
INTRAMUSCULAR | Status: AC
  Filled 2016-02-19: qty 1

## 2016-02-19 MED ORDER — MEPERIDINE HCL 25 MG/ML IJ SOLN: 6.2500 mg | INTRAMUSCULAR | Status: DC | PRN

## 2016-02-19 MED ORDER — MIDAZOLAM HCL 2 MG/2ML IJ SOLN
INTRAMUSCULAR | Status: AC
  Filled 2016-02-19: qty 2

## 2016-02-19 MED ORDER — COUMADIN BOOK
Freq: Once | Status: DC
  Filled 2016-02-19 (×2): qty 1

## 2016-02-19 MED ORDER — PROMETHAZINE HCL 25 MG/ML IJ SOLN
6.2500 mg | INTRAMUSCULAR | Status: DC | PRN
Start: 2016-02-19 — End: 2016-02-19

## 2016-02-19 MED ORDER — ONDANSETRON HCL 4 MG/2ML IJ SOLN
INTRAMUSCULAR | Status: DC | PRN
  Administered 2016-02-19: 4 mg via INTRAVENOUS

## 2016-02-19 MED ORDER — ONDANSETRON HCL 4 MG/2ML IJ SOLN
INTRAMUSCULAR | Status: AC
  Filled 2016-02-19: qty 2

## 2016-02-19 MED ORDER — 0.9 % SODIUM CHLORIDE (POUR BTL) OPTIME
TOPICAL | Status: DC | PRN
  Administered 2016-02-19: 1000 mL

## 2016-02-19 MED ORDER — SUCCINYLCHOLINE CHLORIDE 200 MG/10ML IV SOSY
PREFILLED_SYRINGE | INTRAVENOUS | Status: AC
  Filled 2016-02-19: qty 10

## 2016-02-19 MED ORDER — LACTATED RINGERS IV SOLN
INTRAVENOUS | Status: DC
  Administered 2016-02-19 (×2): via INTRAVENOUS

## 2016-02-19 MED ORDER — WARFARIN VIDEO
Freq: Once | Status: AC
  Administered 2016-02-20: 11:00:00

## 2016-02-19 SURGICAL SUPPLY — 95 items
BANDAGE ACE 4X5 VEL STRL LF (GAUZE/BANDAGES/DRESSINGS) ×3 IMPLANT
BANDAGE ACE 6X5 VEL STRL LF (GAUZE/BANDAGES/DRESSINGS) ×3 IMPLANT
BANDAGE ESMARK 6X9 LF (GAUZE/BANDAGES/DRESSINGS) ×1 IMPLANT
BIT DRILL 2.5X2.75 QC CALB (BIT) ×2 IMPLANT
BLADE SURG 10 STRL SS (BLADE) ×3 IMPLANT
BLADE SURG 15 STRL LF DISP TIS (BLADE) ×1 IMPLANT
BLADE SURG 15 STRL SS (BLADE) ×3
BLADE SURG ROTATE 9660 (MISCELLANEOUS) ×2 IMPLANT
BNDG CMPR 9X6 STRL LF SNTH (GAUZE/BANDAGES/DRESSINGS) ×1
BNDG COHESIVE 4X5 TAN STRL (GAUZE/BANDAGES/DRESSINGS) ×3 IMPLANT
BNDG ESMARK 6X9 LF (GAUZE/BANDAGES/DRESSINGS) ×3
BNDG GAUZE ELAST 4 BULKY (GAUZE/BANDAGES/DRESSINGS) ×3 IMPLANT
BRUSH SCRUB DISP (MISCELLANEOUS) ×6 IMPLANT
CANISTER WOUND CARE 500ML ATS (WOUND CARE) IMPLANT
COVER MAYO STAND STRL (DRAPES) ×3 IMPLANT
COVER SURGICAL LIGHT HANDLE (MISCELLANEOUS) ×6 IMPLANT
CUFF TOURNIQUET SINGLE 34IN LL (TOURNIQUET CUFF) ×3 IMPLANT
DRAPE C-ARM 42X72 X-RAY (DRAPES) ×3 IMPLANT
DRAPE C-ARMOR (DRAPES) ×3 IMPLANT
DRAPE INCISE IOBAN 66X45 STRL (DRAPES) ×3 IMPLANT
DRAPE ORTHO SPLIT 77X108 STRL (DRAPES) ×6
DRAPE SURG ORHT 6 SPLT 77X108 (DRAPES) ×2 IMPLANT
DRAPE TABLE COVER HEAVY DUTY (DRAPES) ×2 IMPLANT
DRAPE U-SHAPE 47X51 STRL (DRAPES) ×3 IMPLANT
DRILL BIT 2.5X100 214235007 (MISCELLANEOUS) ×2 IMPLANT
DRILL BIT 2.7X100 214235006 DU (MISCELLANEOUS) ×2 IMPLANT
DRSG ADAPTIC 3X8 NADH LF (GAUZE/BANDAGES/DRESSINGS) ×3 IMPLANT
DRSG PAD ABDOMINAL 8X10 ST (GAUZE/BANDAGES/DRESSINGS) ×12 IMPLANT
DRSG VAC ATS MED SENSATRAC (GAUZE/BANDAGES/DRESSINGS) IMPLANT
ELECT REM PT RETURN 9FT ADLT (ELECTROSURGICAL) ×3
ELECTRODE REM PT RTRN 9FT ADLT (ELECTROSURGICAL) ×1 IMPLANT
GAUZE SPONGE 4X4 12PLY STRL (GAUZE/BANDAGES/DRESSINGS) ×3 IMPLANT
GLOVE BIO SURGEON STRL SZ7.5 (GLOVE) ×3 IMPLANT
GLOVE BIO SURGEON STRL SZ8 (GLOVE) ×3 IMPLANT
GLOVE BIOGEL PI IND STRL 7.0 (GLOVE) IMPLANT
GLOVE BIOGEL PI IND STRL 7.5 (GLOVE) ×1 IMPLANT
GLOVE BIOGEL PI IND STRL 8 (GLOVE) ×1 IMPLANT
GLOVE BIOGEL PI INDICATOR 7.0 (GLOVE) ×6
GLOVE BIOGEL PI INDICATOR 7.5 (GLOVE) ×2
GLOVE BIOGEL PI INDICATOR 8 (GLOVE) ×2
GLOVE SURG SS PI 8.0 STRL IVOR (GLOVE) ×4 IMPLANT
GOWN STRL REUS W/ TWL LRG LVL3 (GOWN DISPOSABLE) ×2 IMPLANT
GOWN STRL REUS W/ TWL XL LVL3 (GOWN DISPOSABLE) ×1 IMPLANT
GOWN STRL REUS W/TWL LRG LVL3 (GOWN DISPOSABLE) ×6
GOWN STRL REUS W/TWL XL LVL3 (GOWN DISPOSABLE) ×3
IMMOBILIZER KNEE 22 UNIV (SOFTGOODS) ×3 IMPLANT
K-WIRE ACE 1.6X6 (WIRE) ×15
KIT BASIN OR (CUSTOM PROCEDURE TRAY) ×3 IMPLANT
KIT ROOM TURNOVER OR (KITS) ×3 IMPLANT
KWIRE ACE 1.6X6 (WIRE) IMPLANT
MANIFOLD NEPTUNE II (INSTRUMENTS) ×3 IMPLANT
NDL SUT 6 .5 CRC .975X.05 MAYO (NEEDLE) IMPLANT
NEEDLE MAYO TAPER (NEEDLE)
NS IRRIG 1000ML POUR BTL (IV SOLUTION) ×3 IMPLANT
PACK GENERAL/GYN (CUSTOM PROCEDURE TRAY) ×3 IMPLANT
PACK UNIVERSAL I (CUSTOM PROCEDURE TRAY) ×2 IMPLANT
PAD ABD 8X10 STRL (GAUZE/BANDAGES/DRESSINGS) ×2 IMPLANT
PAD ARMBOARD 7.5X6 YLW CONV (MISCELLANEOUS) ×6 IMPLANT
PAD CAST 4YDX4 CTTN HI CHSV (CAST SUPPLIES) ×1 IMPLANT
PADDING CAST COTTON 4X4 STRL (CAST SUPPLIES) ×3
PADDING CAST COTTON 6X4 STRL (CAST SUPPLIES) ×3 IMPLANT
PLATE LOCK 7H STD LT PROX TIB (Plate) ×2 IMPLANT
PLATE LOCK COMP 5H FOOT (Plate) ×2 IMPLANT
SCREW CORTICAL 3.5MM  30MM (Screw) ×2 IMPLANT
SCREW CORTICAL 3.5MM  44MM (Screw) ×2 IMPLANT
SCREW CORTICAL 3.5MM 30MM (Screw) IMPLANT
SCREW CORTICAL 3.5MM 36MM (Screw) ×2 IMPLANT
SCREW CORTICAL 3.5MM 38MM (Screw) ×6 IMPLANT
SCREW CORTICAL 3.5MM 40MM (Screw) ×2 IMPLANT
SCREW CORTICAL 3.5MM 44MM (Screw) IMPLANT
SCREW LOCK 3.5X70 816135070 (Screw) ×2 IMPLANT
SCREW LOCK 3.5X75 816135075 DU (Screw) ×8 IMPLANT
SCREW LOW PROF CORTICAL 3.5X80 (Screw) ×2 IMPLANT
SCREW LP 3.5X70MM (Screw) ×2 IMPLANT
SPONGE GAUZE 4X4 12PLY STER LF (GAUZE/BANDAGES/DRESSINGS) ×2 IMPLANT
SPONGE LAP 18X18 X RAY DECT (DISPOSABLE) ×3 IMPLANT
STAPLER VISISTAT 35W (STAPLE) ×3 IMPLANT
STOCKINETTE IMPERVIOUS 9X36 MD (GAUZE/BANDAGES/DRESSINGS) ×3 IMPLANT
SUCTION FRAZIER HANDLE 10FR (MISCELLANEOUS) ×2
SUCTION TUBE FRAZIER 10FR DISP (MISCELLANEOUS) ×1 IMPLANT
SUT ETHILON 2 0 FSLX (SUTURE) IMPLANT
SUT ETHILON 3 0 PS 1 (SUTURE) ×4 IMPLANT
SUT PROLENE 0 CT 2 (SUTURE) ×4 IMPLANT
SUT VIC AB 0 CT1 27 (SUTURE) ×3
SUT VIC AB 0 CT1 27XBRD ANBCTR (SUTURE) ×1 IMPLANT
SUT VIC AB 1 CT1 27 (SUTURE) ×3
SUT VIC AB 1 CT1 27XBRD ANBCTR (SUTURE) ×1 IMPLANT
SUT VIC AB 2-0 CT1 27 (SUTURE) ×9
SUT VIC AB 2-0 CT1 TAPERPNT 27 (SUTURE) ×2 IMPLANT
SUT VIC AB 2-0 CT3 27 (SUTURE) IMPLANT
SYR 20ML ECCENTRIC (SYRINGE) IMPLANT
TOWEL OR 17X24 6PK STRL BLUE (TOWEL DISPOSABLE) ×3 IMPLANT
TOWEL OR 17X26 10 PK STRL BLUE (TOWEL DISPOSABLE) ×6 IMPLANT
TRAY FOLEY CATH 16FRSI W/METER (SET/KITS/TRAYS/PACK) IMPLANT
YANKAUER SUCT BULB TIP NO VENT (SUCTIONS) ×3 IMPLANT

## 2016-02-19 NOTE — Brief Op Note (Signed)
02/15/2016 - 02/19/2016  11:59 AM  PATIENT:  Adrian Rivas  55 y.o. male  PRE-OPERATIVE DIAGNOSIS:   LEFT BICONDYLAR TIBIAL PLATEAU FRACTURE  POST-OPERATIVE DIAGNOSIS:   LEFT BICONDYLAR TIBIAL PLATEAU FRACTURE  PROCEDURE:  Procedure(s): 1. OPEN REDUCTION INTERNAL FIXATION (ORIF) LEFT BICONDYLAR TIBIAL PLATEAU FRACTURE 2. ANTERIOR COMPARTMENT FASCIOTOMY (Left)  SURGEON:  Surgeon(s) and Role:    * Myrene GalasMichael Rayford Williamsen, MD - Primary  ASSISTANTS: Hart CarwinJustin Queen, RNFA   ANESTHESIA:   general  EBL:  Total I/O In: 1700 [I.V.:1700] Out: 80 [Blood:80]  BLOOD ADMINISTERED:none  DRAINS: none   LOCAL MEDICATIONS USED:  NONE  SPECIMEN:  No Specimen  DISPOSITION OF SPECIMEN:  N/A  COUNTS:  YES  TOURNIQUET:    DICTATION: .Other Dictation: Dictation Number [id number]  PLAN OF CARE: Admit to inpatient   PATIENT DISPOSITION:  PACU - hemodynamically stable.   Delay start of Pharmacological VTE agent (>24hrs) due to surgical blood loss or risk of bleeding: no

## 2016-02-19 NOTE — Anesthesia Postprocedure Evaluation (Signed)
Anesthesia Post Note  Patient: Adrian Rivas  Procedure(s) Performed: Procedure(s) (LRB): OPEN REDUCTION INTERNAL FIXATION (ORIF) TIBIAL PLATEAU (Left) ANTERIOR COMPARTMENT FASCIOTOMY (Left)  Patient location during evaluation: PACU Anesthesia Type: General Level of consciousness: awake and alert and oriented Pain management: pain level controlled Vital Signs Assessment: post-procedure vital signs reviewed and stable Respiratory status: spontaneous breathing, nonlabored ventilation, respiratory function stable and patient connected to nasal cannula oxygen Cardiovascular status: blood pressure returned to baseline and stable Postop Assessment: no signs of nausea or vomiting Anesthetic complications: no    Last Vitals:  Vitals:   02/19/16 1215 02/19/16 1230  BP: (!) 155/86 (!) 180/97  Pulse: (!) 116 (!) 112  Resp: 17 20  Temp:      Last Pain:  Vitals:   02/19/16 1228  TempSrc:   PainSc: 10-Worst pain ever                 Adrian Hupfer A.

## 2016-02-19 NOTE — Progress Notes (Signed)
Orthopedic Tech Progress Note Patient Details:  Kai Levinsimothy E Baumler 17-Dec-1960 [id number] [facility] completed hinged knee brace order. Patient ID: Kai Levinsimothy E Schader, male   DOB: 17-Dec-1960, 55 y.o.   MRN: [id number]   Clois Dupesvery S An Lannan 02/19/2016, 4:59 PM

## 2016-02-19 NOTE — Transfer of Care (Signed)
Immediate Anesthesia Transfer of Care Note  Patient: Adrian Rivas  Procedure(s) Performed: Procedure(s): OPEN REDUCTION INTERNAL FIXATION (ORIF) TIBIAL PLATEAU (Left) ANTERIOR COMPARTMENT FASCIOTOMY (Left)  Patient Location: PACU  Anesthesia Type:General  Level of Consciousness: awake, alert  and oriented  Airway & Oxygen Therapy: Patient Spontanous Breathing and Patient connected to nasal cannula oxygen  Post-op Assessment: Report given to RN, Post -op Vital signs reviewed and stable and Patient moving all extremities X 4  Post vital signs: Reviewed and stable  Last Vitals:  Vitals:   02/19/16 0528 02/19/16 0805  BP: (!) 157/80 (!) 188/92  Pulse: (!) 103 98  Resp: 18 20  Temp: 36.8 C     Last Pain:  Vitals:   02/19/16 0528  TempSrc: Oral  PainSc:       Patients Stated Pain Goal: 7 (02/16/16 0537)  Complications: No apparent anesthesia complications

## 2016-02-19 NOTE — Anesthesia Procedure Notes (Signed)
Procedure Name: Intubation Date/Time: 02/19/2016 7:45 AM Performed by: Marena ChancyBECKNER, Estelene Carmack S Pre-anesthesia Checklist: Patient identified, Emergency Drugs available, Suction available and Patient being monitored Patient Re-evaluated:Patient Re-evaluated prior to inductionOxygen Delivery Method: Circle System Utilized Preoxygenation: Pre-oxygenation with 100% oxygen Intubation Type: IV induction Ventilation: Mask ventilation without difficulty Laryngoscope Size: Miller and 3 Grade View: Grade II Tube type: Oral Tube size: 7.5 mm Number of attempts: 1 Airway Equipment and Method: Stylet and Oral airway Placement Confirmation: ETT inserted through vocal cords under direct vision,  positive ETCO2 and breath sounds checked- equal and bilateral Tube secured with: Tape Dental Injury: Teeth and Oropharynx as per pre-operative assessment

## 2016-02-19 NOTE — Progress Notes (Signed)
Dr foster at bedside aware of bp no new orders obtained will administer aditional pain med

## 2016-02-19 NOTE — Plan of Care (Signed)
Problem: Pain Managment: Goal: General experience of comfort will improve Outcome: Progressing Constant pain  Problem: Activity: Goal: Risk for activity intolerance will decrease Outcome: Progressing Bed rest  Problem: Bowel/Gastric: Goal: Will not experience complications related to bowel motility Outcome: Progressing Last BM on 02/14/16, was given Miralax and Colace

## 2016-02-19 NOTE — Progress Notes (Signed)
PT Cancellation Note  Patient Details Name: Adrian Rivas MRN: [id number] DOB: 1960/07/01   Cancelled Treatment:    Reason Eval/Treat Not Completed: Patient at procedure or test/unavailable;Patient not medically ready (pt in OR; will see after surgery per ortho recommendations)   [facility] Surgery CenterWILLIAMS,[name] 02/19/2016, 8:20 AM

## 2016-02-19 NOTE — Progress Notes (Signed)
Orthopedic Tech Progress Note Patient Details:  Kai Levinsimothy E Fassler [birth year] [id number] Dropped off Bone Foam with patient. Called in hinged knee brace order with [facility] @1220 . Ortho Devices Type of Ortho Device: Ace wrap Ortho Device/Splint Location: Bone Foam Ortho Device/Splint Interventions: Minda MeoOrdered   Jabir Dahlem S Desmund Elman 02/19/2016, 12:20 PM

## 2016-02-19 NOTE — Progress Notes (Signed)
ANTICOAGULATION CONSULT NOTE - Initial Consult  Pharmacy Consult for Coumadin Indication: VTE prophylaxis  Allergies  Allergen Reactions  . No Known Allergies     Patient Measurements: Height: 5\' 6"  (167.6 cm) Weight: 250 lb (113.4 kg) IBW/kg (Calculated) : 63.8  Vital Signs: Temp: 98.8 F (37.1 C) (10/17 1400) BP: 154/87 (10/17 1400) Pulse Rate: 117 (10/17 1400)  Labs:  Recent Labs  02/18/16 0232 02/19/16 0700 02/19/16 1557  HGB 13.6 13.4 13.2  HCT 42.2 40.0 39.7  PLT 150 198 200  APTT  --  30  --   LABPROT  --  13.5  --   INR  --  1.03  --   CREATININE 0.89 0.76 0.85    Estimated Creatinine Clearance: 114.7 mL/min (by C-G formula based on SCr of 0.85 mg/dL).   Medical History: Past Medical History:  Diagnosis Date  . Anxiety   . Arthritis   . COPD (chronic obstructive pulmonary disease) (HCC)    TOLD HE HAS COPD - QUIT SMOKING 15 YRS AGO-- NO C/O OF SOB  . Depression   . Dyslipidemia   . HTN (hypertension)   . Pain    CHRONIC NECK PAIN- HX OF ANTERIOR FUSION  . Pain    LOWER BACK WITH PAIN DOWN BOTH LEGS - BUT WORSE LEFT LEG WITH NUMBNESS OUTER SIDE OF LEFT LEG - HAS SPINAL STENOSIS   Assessment:  55 yr old male to begin Coumadin for VTE prophylaxis.  Left tibial plateau fracture after fall 02/15/16.  S/p ORIF left tibial plateau fracture and anterior compartment fasciotomy today.  Received Lovenox 40 mg sq on 10/15 and 10/16, held today for surgery.   Goal of Therapy:  INR 2-3 Monitor platelets by anticoagulation protocol: Yes   Plan:   Coumadin 7.5 mg x 1 tonight.  Daily PT/INR.  Lovenox 40 mg sq q24hrs to resume in am.  Stop Lovenox when INR >/= 1.8.  Coumadin book/video ordered.  Complete Coumadin education prior to discharge.  Will follow up for length of Coumadin therapy planned.  Dennie FettersEgan, Babbette Dalesandro Donovan, [place] Pager:[phone] 02/19/2016,5:32 PM

## 2016-02-20 ENCOUNTER — Encounter (HOSPITAL_COMMUNITY): Payer: Self-pay | Admitting: Orthopedic Surgery

## 2016-02-20 LAB — CBC
HCT: 39.7 % (ref 39.0–52.0)
HEMOGLOBIN: 13.4 g/dL (ref 13.0–17.0)
MCH: 34.8 pg — ABNORMAL HIGH (ref 26.0–34.0)
MCHC: 33.8 g/dL (ref 30.0–36.0)
MCV: 103.1 fL — ABNORMAL HIGH (ref 78.0–100.0)
Platelets: 203 10*3/uL (ref 150–400)
RBC: 3.85 MIL/uL — AB (ref 4.22–5.81)
RDW: 13 % (ref 11.5–15.5)
WBC: 9.1 10*3/uL (ref 4.0–10.5)

## 2016-02-20 LAB — BASIC METABOLIC PANEL
ANION GAP: 10 (ref 5–15)
BUN: 5 mg/dL — ABNORMAL LOW (ref 6–20)
CHLORIDE: 107 mmol/L (ref 101–111)
CO2: 24 mmol/L (ref 22–32)
CREATININE: 0.8 mg/dL (ref 0.61–1.24)
Calcium: 8.9 mg/dL (ref 8.9–10.3)
GFR calc non Af Amer: >60 mL/min (ref >60)
Glucose, Bld: 103 mg/dL — ABNORMAL HIGH (ref 65–99)
POTASSIUM: 3.5 mmol/L (ref 3.5–5.1)
SODIUM: 141 mmol/L (ref 135–145)

## 2016-02-20 LAB — PROTIME-INR
INR: 1.13
PROTHROMBIN TIME: 14.6 s (ref 11.4–15.2)

## 2016-02-20 LAB — VITAMIN D 25 HYDROXY (VIT D DEFICIENCY, FRACTURES): Vit D, 25-Hydroxy: 85.1 ng/mL (ref 30.0–100.0)

## 2016-02-20 MED ORDER — WARFARIN SODIUM 7.5 MG PO TABS
7.5000 mg | ORAL_TABLET | Freq: Once | ORAL | Status: AC
  Administered 2016-02-20: 7.5 mg via ORAL
  Filled 2016-02-20: qty 1

## 2016-02-20 NOTE — Progress Notes (Signed)
ANTICOAGULATION CONSULT NOTE - Follow Up Consult  Pharmacy Consult for Warfarin Indication: VTE prophylaxis  Allergies  Allergen Reactions  . No Known Allergies     Patient Measurements: Height: 5\' 6"  (167.6 cm) Weight: 250 lb (113.4 kg) IBW/kg (Calculated) : 63.8  Vital Signs: Temp: 100 F (37.8 C) (10/18 0406) Temp Source: Oral (10/18 0406) BP: 142/83 (10/18 1031) Pulse Rate: 113 (10/18 1031)  Labs:  Recent Labs  02/19/16 0700 02/19/16 1557 02/20/16 0919  HGB 13.4 13.2 13.4  HCT 40.0 39.7 39.7  PLT 198 200 203  APTT 30  --   --   LABPROT 13.5  --  14.6  INR 1.03  --  1.13  CREATININE 0.76 0.85 0.80    Estimated Creatinine Clearance: 121.9 mL/min (by C-G formula based on SCr of 0.8 mg/dL).   Assessment: [age] YOM s/p ORIF on 10/17 and started on warfarin for anticoagulation post-op. Baseline INR 1.03.  INR remains SUBtherapeutic today (INR 1.13 << 1.03, goal of 2-3). CBC wnl and stable - no overt s/sx of bleeding noted. Continues on lovenox for VTE prophylaxis until INR>/=2  Goal of Therapy:  INR 2-3   Plan:  1. Repeat Warfarin 7.5 mg x 1 dose at 1800 today 2. Will continue to monitor for any signs/symptoms of bleeding and will follow up with PT/INR in the a.m.   Thank you for allowing pharmacy to be a part of this patient's care.  Georgina PillionElizabeth Yoshio Seliga, PharmD, BCPS Clinical Pharmacist Pager: [phone] Clinical phone for 02/20/2016 from 7a-3:30p: [phone] If after 3:30p, please call main pharmacy at: [phone] 02/20/2016 11:10 AM

## 2016-02-20 NOTE — Discharge Instructions (Signed)
Information on my medicine - Coumadin   (Warfarin)  This medication education was reviewed with me or my healthcare representative as part of my discharge preparation.  The pharmacist that spoke with me during my hospital stay was:  Rolley SimsMartin, [place], [facility]PH  Why was Coumadin prescribed for you? Coumadin was prescribed for you because you have a blood clot or a medical condition that can cause an increased risk of forming blood clots. Blood clots can cause serious health problems by blocking the flow of blood to the heart, lung, or brain. Coumadin can prevent harmful blood clots from forming. As a reminder your indication for Coumadin is:   Blood Clot Prevention After Orthopedic Surgery  What test will check on my response to Coumadin? While on Coumadin (warfarin) you will need to have an INR test regularly to ensure that your dose is keeping you in the desired range. The INR (international normalized ratio) number is calculated from the result of the laboratory test called prothrombin time (PT).  If an INR APPOINTMENT HAS NOT ALREADY BEEN MADE FOR YOU please schedule an appointment to have this lab work done by your health care provider within 7 days. Your INR goal is usually a number between:  2 to 3 or your provider may give you a more narrow range like 2-2.5.  Ask your health care provider during an office visit what your goal INR is.  What  do you need to  know  About  COUMADIN? Take Coumadin (warfarin) exactly as prescribed by your healthcare provider about the same time each day.  DO NOT stop taking without talking to the doctor who prescribed the medication.  Stopping without other blood clot prevention medication to take the place of Coumadin may increase your risk of developing a new clot or stroke.  Get refills before you run out.  What do you do if you miss a dose? If you miss a dose, take it as soon as you remember on the same day then continue your regularly scheduled regimen the  next day.  Do not take two doses of Coumadin at the same time.  Important Safety Information A possible side effect of Coumadin (Warfarin) is an increased risk of bleeding. You should call your healthcare provider right away if you experience any of the following: ? Bleeding from an injury or your nose that does not stop. ? Unusual colored urine (red or dark brown) or unusual colored stools (red or black). ? Unusual bruising for unknown reasons. ? A serious fall or if you hit your head (even if there is no bleeding).  Some foods or medicines interact with Coumadin (warfarin) and might alter your response to warfarin. To help avoid this: ? Eat a balanced diet, maintaining a consistent amount of Vitamin K. ? Notify your provider about major diet changes you plan to make. ? Avoid alcohol or limit your intake to 1 drink for women and 2 drinks for men per day. (1 drink is 5 oz. wine, 12 oz. beer, or 1.5 oz. liquor.)  Make sure that ANY health care provider who prescribes medication for you knows that you are taking Coumadin (warfarin).  Also make sure the healthcare provider who is monitoring your Coumadin knows when you have started a new medication including herbals and non-prescription products.  Coumadin (Warfarin)  Major Drug Interactions  Increased Warfarin Effect Decreased Warfarin Effect  Alcohol (large quantities) Antibiotics (esp. Septra/Bactrim, Flagyl, Cipro) Amiodarone (Cordarone) Aspirin (ASA) Cimetidine (Tagamet) Megestrol (Megace) NSAIDs (ibuprofen,  naproxen, etc.) °Piroxicam (Feldene) °Propafenone (Rythmol SR) °Propranolol (Inderal) °Isoniazid (INH) °Posaconazole (Noxafil) Barbiturates (Phenobarbital) °Carbamazepine (Tegretol) °Chlordiazepoxide (Librium) °Cholestyramine (Questran) °Griseofulvin °Oral Contraceptives °Rifampin °Sucralfate (Carafate) °Vitamin K  ° °Coumadin® (Warfarin) Major Herbal Interactions  °Increased Warfarin Effect Decreased Warfarin Effect   °Garlic °Ginseng °Ginkgo biloba Coenzyme Q10 °Green tea °St. John’s wort   ° °Coumadin® (Warfarin) FOOD Interactions  °Eat a consistent number of servings per week of foods HIGH in Vitamin K °(1 serving = ½ cup)  °Collards (cooked, or boiled & drained) °Kale (cooked, or boiled & drained) °Mustard greens (cooked, or boiled & drained) °Parsley *serving size only = ¼ cup °Spinach (cooked, or boiled & drained) °Swiss chard (cooked, or boiled & drained) °Turnip greens (cooked, or boiled & drained)  °Eat a consistent number of servings per week of foods MEDIUM-HIGH in Vitamin K °(1 serving = 1 cup)  °Asparagus (cooked, or boiled & drained) °Broccoli (cooked, boiled & drained, or raw & chopped) °Brussel sprouts (cooked, or boiled & drained) *serving size only = ½ cup °Lettuce, raw (green leaf, endive, romaine) °Spinach, raw °Turnip greens, raw & chopped  ° °These websites have more information on Coumadin (warfarin):  www.coumadin.com; °www.ahrq.gov/consumer/coumadin.htm; ° °  °

## 2016-02-20 NOTE — Evaluation (Signed)
Physical Therapy Evaluation Patient Details Name: Adrian Rivas MRN: [id number] DOB: [birth year] Today's Date: 02/20/2016   History of Present Illness  Adrian Rivas is an 55 y.o. white male who sustained a fall this morning. Pt presents with a Tibial Plateau fx and pt s/p ORIF.  Clinical Impression  Pt presents with dependencies in mobility affecting his Independence secondary to Left ORIF Tibial plateau Fx. Pt is currently L NWB and in a hinged knee brace. Pt is having some difficulty maintaining Left NWB. Therapist held the leg off the floor during stand pivot transfer. Pt reports he will have limited support at home since his wife works. Pt may be able to d/c home in a w/c with squat pivot transfers. Will reassess d/c recommendations tomorrow. Pt may need SNF placement if he is unable to maintain NWB with mobility. Pt would benefit from continued skilled PT to focus on increasing mobility and Independence.    Follow Up Recommendations Supervision for mobility/OOB;Home health PT    Equipment Recommendations  Wheelchair (measurements PT) (20x18 left elevating leg rest)    Recommendations for Other Services OT consult     Precautions / Restrictions Precautions Precautions: Fall;Knee Required Braces or Orthoses: Knee Immobilizer - Left Restrictions Weight Bearing Restrictions: Yes LLE Weight Bearing: Non weight bearing      Mobility  Bed Mobility Overal bed mobility: Needs Assistance Bed Mobility: Supine to Sit     Supine to sit: Min assist     General bed mobility comments: min assist to lift LE, verbal cues for technique to increase independence, use of bed rail  Transfers Overall transfer level: Needs assistance Equipment used: Rolling walker (2 wheeled) Transfers: Sit to/from Stand Sit to Stand: Min assist         General transfer comment: cues for technique and therapist holding Left LE into NWB, pt had difficulty holding left foot into DF and wanted to bear  weigh on his toes.  Ambulation/Gait                Stairs            Wheelchair Mobility    Modified Rankin (Stroke Patients Only)       Balance Overall balance assessment: Needs assistance Sitting-balance support: No upper extremity supported Sitting balance-Leahy Scale: Good     Standing balance support: Bilateral upper extremity supported Standing balance-Leahy Scale: Poor                               Pertinent Vitals/Pain Pain Assessment: 0-10 Pain Score: 9  Pain Location: Left knee Pain Descriptors / Indicators: Discomfort Pain Intervention(s): Limited activity within patient's tolerance;Repositioned    Home Living Family/patient expects to be discharged to:: Private residence Living Arrangements: Spouse/significant other Available Help at Discharge: Family Type of Home: House Home Access: Stairs to enter   Secretary/administratorntrance Stairs-Number of Steps: 3 Home Layout: One level Home Equipment: Environmental consultantWalker - 2 wheels;Walker - 4 wheels      Prior Function Level of Independence: Independent               Hand Dominance        Extremity/Trunk Assessment   Upper Extremity Assessment: Defer to OT evaluation           Lower Extremity Assessment: LLE deficits/detail         Communication   Communication: No difficulties  Cognition Arousal/Alertness: Awake/alert Behavior During Therapy: WFL for tasks  assessed/performed Overall Cognitive Status: Within Functional Limits for tasks assessed                      General Comments      Exercises Total Joint Exercises Ankle Circles/Pumps: AROM;Strengthening;10 reps;Both;Supine Quad Sets: AROM;Strengthening;Left;10 reps;Supine Straight Leg Raises: AAROM;Strengthening;Left;10 reps;Supine   Assessment/Plan    PT Assessment Patient needs continued PT services  PT Problem List Decreased strength;Decreased range of motion;Decreased activity tolerance;Decreased balance;Decreased  mobility;Decreased knowledge of use of DME;Decreased safety awareness;Pain          PT Treatment Interventions DME instruction;Gait training;Stair training;Therapeutic activities;Therapeutic exercise;Balance training;Patient/family education;Wheelchair mobility training    PT Goals (Current goals can be found in the Care Plan section)  Acute Rehab PT Goals Patient Stated Goal: To walk PT Goal Formulation: With patient Time For Goal Achievement: 02/27/16 Potential to Achieve Goals: Good    Frequency Min 5X/week   Barriers to discharge Decreased caregiver support wife works and he may not have any help    Co-evaluation               End of Session Equipment Utilized During Treatment: Gait belt;Oxygen;Left knee immobilizer Activity Tolerance: Patient tolerated treatment well;Patient limited by pain Patient left: in chair;with call bell/phone within reach Nurse Communication: Mobility status (+2 transfer hold L LE up (NWB))         Time: 1610-9604 PT Time Calculation (min) (ACUTE ONLY): 45 min   Charges:   PT Evaluation $PT Eval Moderate Complexity: 1 Procedure PT Treatments $Therapeutic Exercise: 8-22 mins $Therapeutic Activity: 8-22 mins   PT G Codes:        Greggory Stallion 02/20/2016, 11:58 AM

## 2016-02-20 NOTE — Care Management Note (Signed)
Case Management Note  Patient Details  Name: Kai Levinsimothy E Winstanley MRN: [id number] Date of Birth: [birth year]  Subjective/Objective:   [age]yr old gentleman s/p ORIF of left tibial plateau fracture, and anterior compartment faciotomy.                 Action/Plan: Case manager spoke with patient concerning home health and DME needs. [facility] Referral called to [facility]Karen[facility]. Wheelchair and 3in1 ,RN for coumadin monitoring have been requested.   Expected Discharge Date:    02/21/16              Expected Discharge Plan:  Home w Home Health Services  In-House Referral:  NA  Discharge planning Services  CM Consult  Post Acute Care Choice:  Durable Medical Equipment, Home Health Choice offered to:  Patient  DME Arranged:  Wheelchair manual DME Agency:  [facility]  [facility]  [facility]  Status of Service:  In process, will continue to follow  If discussed at Long Length of Stay Meetings, dates discussed:    Additional Comments:  Durenda GuthrieBrady, Cory Kitt Naomi, RN 02/20/2016, 2:34 PM

## 2016-02-21 DIAGNOSIS — J449 Chronic obstructive pulmonary disease, unspecified: Secondary | ICD-10-CM

## 2016-02-21 DIAGNOSIS — S82142A Displaced bicondylar fracture of left tibia, initial encounter for closed fracture: Principal | ICD-10-CM

## 2016-02-21 DIAGNOSIS — J441 Chronic obstructive pulmonary disease with (acute) exacerbation: Secondary | ICD-10-CM

## 2016-02-21 DIAGNOSIS — I10 Essential (primary) hypertension: Secondary | ICD-10-CM

## 2016-02-21 LAB — BASIC METABOLIC PANEL
Anion gap: 10 (ref 5–15)
BUN: 8 mg/dL (ref 6–20)
CHLORIDE: 106 mmol/L (ref 101–111)
CO2: 25 mmol/L (ref 22–32)
CREATININE: 0.75 mg/dL (ref 0.61–1.24)
Calcium: 8.9 mg/dL (ref 8.9–10.3)
GFR calc Af Amer: >60 mL/min (ref >60)
GFR calc non Af Amer: >60 mL/min (ref >60)
GLUCOSE: 107 mg/dL — AB (ref 65–99)
POTASSIUM: 3.1 mmol/L — AB (ref 3.5–5.1)
Sodium: 141 mmol/L (ref 135–145)

## 2016-02-21 LAB — PROTIME-INR
INR: 1.2
Prothrombin Time: 15.3 seconds — ABNORMAL HIGH (ref 11.4–15.2)

## 2016-02-21 MED ORDER — WARFARIN SODIUM 7.5 MG PO TABS
7.5000 mg | ORAL_TABLET | Freq: Once | ORAL | Status: AC
  Administered 2016-02-21: 7.5 mg via ORAL
  Filled 2016-02-21: qty 1

## 2016-02-21 NOTE — Consult Note (Signed)
Physical Medicine and Rehabilitation Consult Reason for Consult: Left bicondylar tibial plateau fracture Referring Physician: Dr. Carola Frost   HPI: Adrian Rivas is a 55 y.o. right handed male with history of COPD, chronic neck pain with history of anterior fusion as well as multiple low back surgeries. Per chart review patient lives with spouse independent prior to admission. Patient is on disability from multiple back surgeries. Wife works during the day. One level home with 3 steps to entry. Presented to [facility] 02/15/2016 after a fall while going to the bathroom. Denied loss of consciousness. X-rays and imaging revealed left tibial plateau fracture. He was transferred to [facility] and underwent ORIF with anterior compartment fasciotomy 02/19/2016 per Dr. Carola Frost. Nonweightbearing left lower extremity with hinged knee brace. Hospital course pain management. Subcutaneous Lovenox for DVT prophylaxis and plan to transition to Coumadin. Physical therapy evaluation completed 02/20/2016. M.D. has requested physical medicine rehabilitation consult  Patient is not O2 dependent. Discussed with occupational therapy, he is total assist for lower body ADLs, min/mod assist for transfers  Review of Systems  Constitutional: Negative for chills and fever.  HENT: Negative for hearing loss.   Eyes: Negative for blurred vision.  Respiratory: Negative for cough and shortness of breath.   Cardiovascular: Positive for leg swelling. Negative for chest pain and palpitations.  Gastrointestinal: Positive for constipation. Negative for nausea and vomiting.  Genitourinary: Negative for dysuria and hematuria.  Musculoskeletal: Positive for back pain.  Skin: Negative for rash.  Neurological: Positive for headaches.  Psychiatric/Behavioral: Positive for depression.       Anxiety  All other systems reviewed and are negative.  Past Medical History:  Diagnosis Date  . Anxiety   . Arthritis     . COPD (chronic obstructive pulmonary disease) (HCC)    TOLD HE HAS COPD - QUIT SMOKING 15 YRS AGO-- NO C/O OF SOB  . Depression   . Dyslipidemia   . HTN (hypertension)   . Pain    CHRONIC NECK PAIN- HX OF ANTERIOR FUSION  . Pain    LOWER BACK WITH PAIN DOWN BOTH LEGS - BUT WORSE LEFT LEG WITH NUMBNESS OUTER SIDE OF LEFT LEG - HAS SPINAL STENOSIS   Past Surgical History:  Procedure Laterality Date  . "SMALL BODIES REMOVED" FROM BOTH SHOULDERS    . BACK SURGERY     LUMBAR LAMINECTOMY  . BILATERAL CARPAL TUNNEL RELEASE    . CERVICAL FUSION    . FASCIOTOMY Left 02/19/2016   Procedure: ANTERIOR COMPARTMENT FASCIOTOMY;  Surgeon: Myrene Galas, MD;  Location: [facility] OR;  Service: Orthopedics;  Laterality: Left;  . LUMBAR LAMINECTOMY/DECOMPRESSION MICRODISCECTOMY N/A 10/20/2012   Procedure: DECOMPRESSION L2-L3, L1-L2;  Surgeon: Javier Docker, MD;  Location: [facility] ORS;  Service: Orthopedics;  Laterality: N/A;  . ORIF TIBIA PLATEAU Left 02/19/2016  . ORIF TIBIA PLATEAU Left 02/19/2016   Procedure: OPEN REDUCTION INTERNAL FIXATION (ORIF) TIBIAL PLATEAU;  Surgeon: Myrene Galas, MD;  [facility];  Service: Orthopedics;  Laterality: Left;  . RIGHT ACL RECONSTRUCTION     History reviewed. No pertinent family history. Social History:  reports that he has quit smoking. His smoking use included Cigarettes. He has a 20.00 pack-year smoking history. He has never used smokeless tobacco. He reports that he does not drink alcohol or use drugs. Allergies:  Allergies  Allergen Reactions  . No Known Allergies    Medications Prior to Admission  Medication Sig Dispense Refill  . cholecalciferol (VITAMIN D)  400 UNITS TABS Take 400 Units by mouth daily.    Marland Kitchen. desvenlafaxine (PRISTIQ) 100 MG 24 hr tablet Take 100 mg by mouth every morning.    Marland Kitchen. HYDROcodone-acetaminophen (NORCO) 10-325 MG tablet 1 tablet 3 (three) times daily as needed.  0  . lamoTRIgine (LAMICTAL) 100 MG tablet Take 200 mg by mouth every  morning. TAKES 200 mg in the morning and 100mg  in the afternoon    . lisinopril (PRINIVIL,ZESTRIL) 40 MG tablet 1 tablet daily.  4  . methocarbamol (ROBAXIN) 500 MG tablet Take 1 tablet (500 mg total) by mouth 4 (four) times daily. (Patient taking differently: Take 500 mg by mouth 3 (three) times daily. ) 45 tablet 1  . metoprolol succinate (TOPROL-XL) 25 MG 24 hr tablet 1 tablet daily.  4  . mometasone (NASONEX) 50 MCG/ACT nasal spray Place 2 sprays into the nose daily as needed.     . Multiple Vitamin (MULTIVITAMIN WITH MINERALS) TABS Take 1 tablet by mouth daily.    Marland Kitchen. REXULTI 1 MG TABS 1 tablet daily.  5  . rosuvastatin (CRESTOR) 20 MG tablet Take 20 mg by mouth at bedtime.    . tamsulosin (FLOMAX) 0.4 MG CAPS capsule 1 capsule daily.  0  . zolpidem (AMBIEN) 10 MG tablet Take 10 mg by mouth at bedtime as needed for sleep.      Home: Home Living Family/patient expects to be discharged to:: Private residence Living Arrangements: Spouse/significant other Available Help at Discharge: Family Type of Home: House Home Access: Stairs to enter Secretary/administratorntrance Stairs-Number of Steps: 3 Home Layout: One level Home Equipment: Environmental consultantWalker - 2 wheels, Environmental consultantWalker - 4 wheels  Functional History: Prior Function Level of Independence: Independent Functional Status:  Mobility: Bed Mobility Overal bed mobility: Needs Assistance Bed Mobility: Supine to Sit Supine to sit: Min assist General bed mobility comments: min assist to lift LE, verbal cues for technique to increase independence, use of bed rail Transfers Overall transfer level: Needs assistance Equipment used: Rolling walker (2 wheeled) Transfers: Sit to/from Stand Sit to Stand: Min assist General transfer comment: cues for technique and therapist holding Left LE into NWB, pt had difficulty holding left foot into DF and wanted to bear weigh on his toes.      ADL:    Cognition: Cognition Overall Cognitive Status: Within Functional Limits for tasks  assessed Orientation Level: Oriented X4 Cognition Arousal/Alertness: Awake/alert Behavior During Therapy: WFL for tasks assessed/performed Overall Cognitive Status: Within Functional Limits for tasks assessed  Blood pressure (!) 150/81, pulse 97, temperature 98.7 F (37.1 C), temperature source Oral, resp. rate 16, height 5\' 6"  (1.676 m), weight 113.4 kg (250 lb), SpO2 91 %. Physical Exam  Constitutional: He is oriented to person, place, and time.  HENT:  Head: Normocephalic.  Eyes: EOM are normal.  Neck: Normal range of motion. Neck supple. No thyromegaly present.  Cardiovascular: Normal rate and regular rhythm.   Respiratory: Effort normal and breath sounds normal. No respiratory distress.  GI: Soft. Bowel sounds are normal. He exhibits no distension.  Neurological: He is alert and oriented to person, place, and time.  Skin:  Left hip incision clean and dry    Results for orders placed or performed during the hospital encounter of 02/15/16 (from the past 24 hour(s))  Basic metabolic panel     Status: Abnormal   Collection Time: 02/21/16  3:31 AM  Result Value Ref Range   Sodium 141 135 - 145 mmol/L   Potassium 3.1 (L) 3.5 -  5.1 mmol/L   Chloride 106 101 - 111 mmol/L   CO2 25 22 - 32 mmol/L   Glucose, Bld 107 (H) 65 - 99 mg/dL   BUN 8 6 - 20 mg/dL   Creatinine, Ser 1.61 0.61 - 1.24 mg/dL   Calcium 8.9 8.9 - 09.6 mg/dL   GFR calc non Af Amer >60 >60 mL/min   GFR calc Af Amer >60 >60 mL/min   Anion gap 10 5 - 15  Protime-INR     Status: Abnormal   Collection Time: 02/21/16  3:31 AM  Result Value Ref Range   Prothrombin Time 15.3 (H) 11.4 - 15.2 seconds   INR 1.20    Dg Knee Left Port  Result Date: 02/19/2016 CLINICAL DATA:  Status open reduction internal fixation for tibial plateau fracture. EXAM: PORTABLE LEFT KNEE - 1-2 VIEW Intraoperative images obtained earlier in the day ; CT left knee February 16, 2016 FINDINGS: Frontal and lateral views were obtained. There is  screw and plate fixation transfixing comminuted fracture of the proximal tibia extending into the intracondylar region. Alignment is essentially anatomic. No other fractures. No dislocations. There is a joint effusion. There is slight patellofemoral joint space narrowing. IMPRESSION: Screw and plate fixation transfixing comminuted fractures of the proximal tibia with alignment essentially anatomic. Moderate joint effusion. No other fractures. No dislocation. 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 Electronically Signed   By: Bretta Bang III M.D.   On: 02/19/2016 14:55    Assessment/Plan: Diagnosis: Left tibial plateau fracture status post ORIF 02/19/2016 with nonweightbearing 1. Does the need for close, 24 hr/day medical supervision in concert with the patient's rehab needs make it unreasonable for this patient to be served in a less intensive setting? Yes 2. Co-Morbidities requiring supervision/potential complications: Hypokalemia, pain control, COPD 3. Due to bladder management, bowel management, safety, skin/wound care, disease management, medication administration, pain management and patient education, does the patient require 24 hr/day rehab nursing? Yes 4. Does the patient require coordinated care of a physician, rehab nurse, PT (1-2 hrs/day, 5 days/week) and OT (1-2 hrs/day, 5 days/week) to address physical and functional deficits in the context of the above medical diagnosis(es)? Yes Addressing deficits in the following areas: balance, endurance, locomotion, strength, transferring, bowel/bladder control, bathing, dressing, feeding, toileting and psychosocial support 5. Can the patient actively participate in an intensive therapy program of at least 3 hrs of therapy per day at least 5 days per week? Yes 6. The potential for patient to make measurable gains while on inpatient rehab is excellent 7. Anticipated functional outcomes upon discharge from inpatient rehab are  modified independent  with PT, modified independent with OT, n/a with SLP. 8. Estimated rehab length of stay to reach the above functional goals is: 7-10 days 9. Does the patient have adequate social supports and living environment to accommodate these discharge functional goals? Potentially 10. Anticipated D/C setting: Home 11. Anticipated post D/C treatments: HH therapy 12. Overall Rehab/Functional Prognosis: excellent  RECOMMENDATIONS: This patient's condition is appropriate for continued rehabilitative care in the following setting: CIR Patient has agreed to participate in recommended program. Yes Note that insurance prior authorization may be required for reimbursement for recommended care.  Comment:     02/21/2016

## 2016-02-21 NOTE — Plan of Care (Signed)
Problem: Pain Managment: Goal: General experience of comfort will improve Outcome: Progressing C/O less pain tonight  Problem: Activity: Goal: Ability to avoid complications of mobility impairment will improve Outcome: Progressing Remains in the bed this shift Goal: Will remain free from falls Outcome: Progressing No fall or injury noted

## 2016-02-21 NOTE — Progress Notes (Signed)
Orthopaedic Trauma Service (OTS)   Subjective: Patient reports pain as moderate.  Full assist x two for mobilization.   Objective: Temp:  [98.7 F (37.1 C)-100.8 F (38.2 C)] 98.7 F (37.1 C) (10/19 0430) Pulse Rate:  [95-105] 97 (10/19 0430) Resp:  [16-18] 16 (10/19 0430) BP: (127-150)/(57-81) 150/81 (10/19 0430) SpO2:  [86 %-97 %] 91 % (10/19 0900) Physical Exam LLE Incision intact, clean, dry  Edema/ swelling controlled  Sens: DPN, SPN, TN intact  Motor: EHL, FHL, and lessor toe ext and flex all intact grossly  Brisk cap refill, warm to touch  Assessment/Plan: 2 Days Post-Op Procedure(s) (LRB): OPEN REDUCTION INTERNAL FIXATION (ORIF) TIBIAL PLATEAU (Left) ANTERIOR COMPARTMENT FASCIOTOMY (Left) 1. NWB LLE, full motion of knee and ankle 2. Pain control 3. D/c planning WITH REHAB CONSULTATION 4. Lovenox/ coumadin   Myrene GalasMichael Loralee Weitzman, MD [facility] [phone] (p)

## 2016-02-21 NOTE — Progress Notes (Signed)
Noted PT to reassess dc recommendations today.  Please notify this case manager [facility] as [facility] has already been arranged.    Thank you! Quintella BatonJulie W. Ivonne Freeburg, RN, BSN  Trauma/Neuro ICU Case Manager [phone]

## 2016-02-21 NOTE — [place] FL2 (Signed)
[place] MEDICAID FL2 LEVEL OF CARE SCREENING TOOL     IDENTIFICATION  Patient Name: Adrian Rivas Birthdate: Jul 01, 1960 Sex: male Admission Date (Current Location): 02/15/2016  [facility] and [place] Number:  [employer] and Address:  [facility], [address], [place], [place] [address]      Provider Number: [id number]  Attending Physician Name and Address:  Myrene Galas, MD  Relative Name and Phone Number:       Current Level of Care: Hospital Recommended Level of Care: Skilled Nursing Facility Prior Approval Number:    Date Approved/Denied:   PASRR Number: [id number]  Discharge Plan: SNF    Current Diagnoses: Patient Active Problem List   Diagnosis Date Noted  . Closed tibia fracture 02/15/2016  . Closed bicondylar fracture of left tibial plateau 02/15/2016  . HTN (hypertension)   . Spinal stenosis of lumbar region 10/20/2012  . PULMONARY NODULE 11/10/2007  . CHEST PAIN 11/10/2007    Orientation RESPIRATION BLADDER Height & Weight     Self, Time, Situation, Place  O2 (2L) Continent Weight: 250 lb (113.4 kg) Height:  5\' 6"  (167.6 cm)  BEHAVIORAL SYMPTOMS/MOOD NEUROLOGICAL BOWEL NUTRITION STATUS      Continent    AMBULATORY STATUS COMMUNICATION OF NEEDS Skin   Extensive Assist (2+) Verbally Surgical wounds                       Personal Care Assistance Level of Assistance  Bathing, Dressing Bathing Assistance: Limited assistance   Dressing Assistance: Limited assistance     Functional Limitations Info             SPECIAL CARE FACTORS FREQUENCY  OT (By licensed OT), PT (By licensed PT)     PT Frequency: daily OT Frequency: daily            Contractures Contractures Info: Not present    Additional Factors Info  Code Status, Allergies Code Status Info: FULL Allergies Info: NKA           Current Medications (02/21/2016):  This is the current hospital active medication list Current  Facility-Administered Medications  Medication Dose Route Frequency Provider Last Rate Last Dose  . 0.9 % NaCl with KCl 20 mEq/ L  infusion   Intravenous Continuous Montez Morita, PA-C 100 mL/hr at 02/19/16 1552    . acetaminophen (TYLENOL) tablet 650 mg  650 mg Oral Q6H PRN Montez Morita, PA-C       Or  . acetaminophen (TYLENOL) suppository 650 mg  650 mg Rectal Q6H PRN Montez Morita, PA-C      . acetaminophen (TYLENOL) tablet 650 mg  650 mg Oral Q6H PRN Montez Morita, PA-C       Or  . acetaminophen (TYLENOL) suppository 650 mg  650 mg Rectal Q6H PRN Montez Morita, PA-C      . bisacodyl (DULCOLAX) EC tablet 5 mg  5 mg Oral Daily PRN Montez Morita, PA-C      . Brexpiprazole TABS 1 mg  1 tablet Oral Daily Montez Morita, PA-C   1 mg at 02/21/16 0954  . coumadin book   Does not apply Once Scarlett Presto, [facility]      . desvenlafaxine (PRISTIQ) 24 hr tablet 100 mg  100 mg Oral q morning - 10a Montez Morita, PA-C   100 mg at 02/21/16 0954  . docusate sodium (COLACE) capsule 100 mg  100 mg Oral BID Montez Morita, PA-C   100 mg  at 02/21/16 0954  . enoxaparin (LOVENOX) injection 40 mg  40 mg Subcutaneous Q24H Montez MoritaKeith Paul, PA-C   40 mg at 02/21/16 0954  . HYDROmorphone (DILAUDID) injection 1 mg  1 mg Intravenous Q2H PRN Montez MoritaKeith Paul, PA-C   1 mg at 02/18/16 1301  . lactated ringers infusion   Intravenous Continuous Mal AmabileMichael Foster, MD 10 mL/hr at 02/19/16 [phone]    . lamoTRIgine (LAMICTAL) tablet 200 mg  200 mg Oral q morning - 10a Montez MoritaKeith Paul, PA-C   200 mg at 02/21/16 0954  . lisinopril (PRINIVIL,ZESTRIL) tablet 40 mg  40 mg Oral Daily Montez MoritaKeith Paul, PA-C   40 mg at 02/21/16 0954  . magnesium hydroxide (MILK OF MAGNESIA) suspension 30 mL  30 mL Oral Daily PRN Montez MoritaKeith Paul, PA-C      . methocarbamol (ROBAXIN) tablet 1,000 mg  1,000 mg Oral Q6H Montez MoritaKeith Paul, PA-C   1,000 mg at 02/21/16 0648  . metoCLOPramide (REGLAN) tablet 5-10 mg  5-10 mg Oral Q8H PRN Montez MoritaKeith Paul, PA-C       Or  . metoCLOPramide (REGLAN) injection 5-10 mg  5-10 mg Intravenous Q8H  PRN Montez MoritaKeith Paul, PA-C      . metoprolol succinate (TOPROL-XL) 24 hr tablet 25 mg  25 mg Oral Daily Montez MoritaKeith Paul, PA-C   25 mg at 02/21/16 0954  . ondansetron (ZOFRAN) tablet 4 mg  4 mg Oral Q6H PRN Montez MoritaKeith Paul, PA-C       Or  . ondansetron [facility](ZOFRAN) injection 4 mg  4 mg Intravenous Q6H PRN Montez MoritaKeith Paul, PA-C      . ondansetron [facility](ZOFRAN) tablet 4 mg  4 mg Oral Q6H PRN Montez MoritaKeith Paul, PA-C       Or  . ondansetron [facility](ZOFRAN) injection 4 mg  4 mg Intravenous Q6H PRN Montez MoritaKeith Paul, PA-C      . oxyCODONE (Oxy IR/ROXICODONE) immediate release tablet 5-10 mg  5-10 mg Oral Q3H PRN Montez MoritaKeith Paul, PA-C   10 mg at 02/20/16 1246  . oxyCODONE-acetaminophen (PERCOCET/ROXICET) 5-325 MG per tablet 1-2 tablet  1-2 tablet Oral Q6H PRN Montez MoritaKeith Paul, PA-C   2 tablet at 02/21/16 0954  . rosuvastatin (CRESTOR) tablet 20 mg  20 mg Oral QHS Montez MoritaKeith Paul, PA-C   20 mg at 02/20/16 2213  . tamsulosin (FLOMAX) capsule 0.4 mg  0.4 mg Oral Daily Montez MoritaKeith Paul, PA-C   0.4 mg at 02/21/16 0954  . Warfarin - Pharmacist Dosing Inpatient   Does not apply q1800 Scarlett Prestoheresa D Egan, [facility]      . zolpidem [facility](AMBIEN) tablet 10 mg  10 mg Oral QHS PRN Montez MoritaKeith Paul, PA-C   10 mg at 02/20/16 2213     Discharge Medications: Please see discharge summary for a list of discharge medications.  Relevant Imaging Results:  Relevant Lab Results:   Additional Information SSN: [id number]  Rondel Batonngle, Sudie Bandel C, LCSW

## 2016-02-21 NOTE — Clinical Social Work Note (Signed)
Clinical Social Work Assessment  Patient Details  Name: Adrian Rivas MRN: [id number] Date of Birth: November 01, 1960  Date of referral:  02/21/16               Reason for consult:  Facility Placement                Permission sought to share information with:  Customer service manager, Case Optician, dispensing granted to share information::  Yes, Verbal Permission Granted  Name::        Agency::  [facility]  Relationship::     Contact Information:     Housing/Transportation Living arrangements for the past 2 months:  [place] of Information:  Patient Patient Interpreter Needed:  None Criminal Activity/Legal Involvement Pertinent to Current Situation/Hospitalization:  No - Comment as needed Significant Relationships:  Adult Children, Spouse Lives with:  Spouse Do you feel safe going back to the place where you live?  No Need for family participation in patient care:  No (Coment)  Care giving concerns:  No caregivers were present at time of this assessment.   Social Worker assessment / plan:  CSW met with patient at bedside to review disposition plans.  Per medical staff, patient is 2+ assist and will need STR at SNF or CIR once medically stable.  Per notes, MD placed CIR order today for possible admission.  SNF has been approved by patient to run simultaneously due to the possibility of lack of beds in CIR or possible insurance denial.  Patient is aware of these possibilities and is agreeable to SNF.  States wouldn't mind SNF in [place] area.  Employment status:  Disabled (Comment on whether or not currently receiving Disability) Insurance information:  Medicare PT Recommendations:  [place] / Referral to community resources:  [place]  Patient/Family's Response to care:  Patient is agreeable to SNF.  Patient/Family's Understanding of and Emotional Response to Diagnosis, Current  Treatment, and Prognosis:  Patient exhibits moderate anxiety regarding the transition to rehabilitation from the acute care setting.  Patient anticipated doing better than he is so this is a new transition for the patient.  CSW offered support during this time along with education to place patient more at ease with the process.  Patient was appreciative of CSW support.  Emotional Assessment Appearance:  Appears older than stated age Attitude/Demeanor/Rapport:    Affect (typically observed):  Accepting, Adaptable, Pleasant Orientation:  Oriented to Self, Oriented to Place, Oriented to Situation, Oriented to  Time Alcohol / Substance use:  Not Applicable Psych involvement (Current and /or in the community):  No (Comment)  Discharge Needs  Concerns to be addressed:  No discharge needs identified Readmission within the last 30 days:  No Current discharge risk:  None Barriers to Discharge:  Ship broker, Continued Medical Work up   [employer], LCSW 02/21/2016, 11:41 AM

## 2016-02-21 NOTE — Evaluation (Signed)
Occupational Therapy Evaluation Patient Details Name: Adrian Rivas MRN: [id number] DOB: [birth year] Today's Date: 02/21/2016    History of Present Illness Pt is an 55 y.o. male who sustained a fall in his bathroom 02/14/2016. Pt presents with a Tibial Plateau fx and pt s/p ORIF. Pt  has a past medical history of Anxiety; Arthritis; COPD (chronic obstructive pulmonary disease) (HCC); Depression; Dyslipidemia; HTN (hypertension); Chronic back and neck Pain; spinal stenosis, lumbar laminectomy/decompression (2014), cervical fusion.   Clinical Impression   PTA Pt independent in ADL and mobility. Pt currently max assist for ADL and +2 mod assist for transfers and mobility. Pt with self care deficit list below. Pt eager and willing to work with therapy despite high pain. Pt needed constant verbal cueing for non-weight bearing status. Pt will benefit from skilled OT in the acute care setting to maximize independence in ADL, and safety. Pt excellent candidate for CIR to for personal Pt independence and also to decrease caregiver burden.     Follow Up Recommendations  CIR    Equipment Recommendations  Other (comment) (TBD by next venue of care)    Recommendations for Other Services Rehab consult     Precautions / Restrictions Precautions Precautions: Fall;Knee Precaution Booklet Issued: No Required Braces or Orthoses: Other Brace/Splint Other Brace/Splint: hinged knee brace Restrictions Weight Bearing Restrictions: Yes LLE Weight Bearing: Non weight bearing      Mobility Bed Mobility Overal bed mobility: Needs Assistance Bed Mobility: Rolling;Sidelying to Sit Rolling: Mod assist;+2 for physical assistance Sidelying to sit: Mod assist;+2 for physical assistance       General bed mobility comments: Mod assist +2 for rolling and advance B LEs to edge of bed during sidelying to sit.    Transfers Overall transfer level: Needs assistance Equipment used: Rolling walker (2  wheeled) Transfers: Sit to/from Stand Sit to Stand: Min assist;+2 physical assistance         General transfer comment: Cues for hand placement and forward weight shifting.  Pt required cues for sequencing and PTA supported L foot to maintain NWB.  Pt remains to require constant VCs to keep LLE off the floor.      Balance Overall balance assessment: Needs assistance Sitting-balance support: No upper extremity supported;Feet supported Sitting balance-Leahy Scale: Good     Standing balance support: During functional activity;Bilateral upper extremity supported Standing balance-Leahy Scale: Poor                              ADL Overall ADL's : Needs assistance/impaired Eating/Feeding: Set up;Sitting   Grooming: Wash/dry hands;Wash/dry face;Set up;Sitting Grooming Details (indicate cue type and reason): unable to sustain standing for ADL at this time Upper Body Bathing: Set up;Sitting   Lower Body Bathing: Total assistance;Bed level Lower Body Bathing Details (indicate cue type and reason): Pt would benefit from education in AE  Upper Body Dressing : Set up;Sitting   Lower Body Dressing: Total assistance;Cueing for sequencing;Sit to/from stand Lower Body Dressing Details (indicate cue type and reason): Pt unable to reach down to feet for LB dressing, Pt will benefit from AE education Toilet Transfer: Moderate assistance;+2 for physical assistance;Cueing for safety;Stand-pivot;RW Toilet Transfer Details (indicate cue type and reason): Pt unable to maintain NWB despite vc from therapists; simulated with recliner Toileting- Clothing Manipulation and Hygiene: Total assistance;Sit to/from stand     Tub/Shower Transfer Details (indicate cue type and reason): Pt unable to maintain NWB during stand pivot, ambulation  not attempted at this time. Functional mobility during ADLs: +2 for physical assistance;Moderate assistance;Cueing for sequencing;Rolling walker;Cueing for  safety General ADL Comments: Pt willing to work with therapy despite high pain score. Pt motivated to increase independence. Great CIR candidate     Vision     Perception     Praxis      Pertinent Vitals/Pain Pain Assessment: Faces Faces Pain Scale: Hurts even more Pain Location: Left Knee Pain Descriptors / Indicators: Discomfort;Sore Pain Intervention(s): Monitored during session;Repositioned;Ice applied     Hand Dominance Right   Extremity/Trunk Assessment Upper Extremity Assessment Upper Extremity Assessment: Overall WFL for tasks assessed   Lower Extremity Assessment Lower Extremity Assessment: LLE deficits/detail LLE Deficits / Details: decreased strength and ROM post-op LLE: Unable to fully assess due to pain   Cervical / Trunk Assessment Cervical / Trunk Assessment: Other exceptions;Normal (history of back surgeries) Cervical / Trunk Exceptions: previous fusion   Communication Communication Communication: No difficulties   Cognition Arousal/Alertness: Awake/alert Behavior During Therapy: WFL for tasks assessed/performed Overall Cognitive Status: Within Functional Limits for tasks assessed                     General Comments       Exercises       Shoulder Instructions      Home Living Family/patient expects to be discharged to:: Inpatient rehab Living Arrangements: Spouse/significant other Available Help at Discharge: Family;Available PRN/intermittently Type of Home: House Home Access: Stairs to enter Entrance Stairs-Number of Steps: 3   Home Layout: One level               Home Equipment: Walker - 2 wheels;Walker - 4 wheels;Bedside commode          Prior Functioning/Environment Level of Independence: Independent                 OT Problem List: Decreased strength;Decreased range of motion;Decreased activity tolerance;Impaired balance (sitting and/or standing);Decreased safety awareness;Decreased knowledge of use of DME  or AE;Decreased knowledge of precautions;Pain   OT Treatment/Interventions: Self-care/ADL training;Therapeutic exercise;DME and/or AE instruction;Therapeutic activities;Patient/family education;Balance training    OT Goals(Current goals can be found in the care plan section) Acute Rehab OT Goals Patient Stated Goal: To walk OT Goal Formulation: With patient Time For Goal Achievement: 02/28/16 Potential to Achieve Goals: Good ADL Goals Pt Will Perform Grooming: with modified independence;sitting Pt Will Perform Lower Body Bathing: with modified independence;with adaptive equipment;sit to/from stand Pt Will Perform Lower Body Dressing: with modified independence;with caregiver independent in assisting;with adaptive equipment;sit to/from stand Pt Will Transfer to Toilet: with supervision;ambulating;bedside commode Pt Will Perform Toileting - Clothing Manipulation and hygiene: with modified independence;sit to/from stand Pt Will Perform Tub/Shower Transfer: Shower transfer;with supervision;with caregiver independent in assisting;ambulating;rolling walker  OT Frequency: Min 3X/week   Barriers to D/C:            Co-evaluation PT/OT/SLP Co-Evaluation/Treatment: Yes Reason for Co-Treatment: Complexity of the patient's impairments (multi-system involvement);For patient/therapist safety PT goals addressed during session: Mobility/safety with mobility OT goals addressed during session: ADL's and self-care;Proper use of Adaptive equipment and DME      End of Session Equipment Utilized During Treatment: Gait belt;Rolling walker;Other (comment) (hinged knee brace) Nurse Communication: Patient requests pain meds;Other (comment) (RN in room giving pain meds)  Activity Tolerance: Patient tolerated treatment well Patient left: in chair;with call bell/phone within reach;with nursing/sitter in room   Time: 4098-1191 OT Time Calculation (min): 39 min Charges:  OT General Charges $OT  Visit: 1  Procedure OT Evaluation $OT Eval Moderate Complexity: 1 Procedure G-Codes:    Evern Bio Clarrissa Shimkus 2016-03-06, 5:24 PM Sherryl Manges OTR/L [phone]

## 2016-02-21 NOTE — Progress Notes (Signed)
ANTICOAGULATION CONSULT NOTE - Follow Up Consult  Pharmacy Consult for Warfarin Indication: VTE prophylaxis  Allergies  Allergen Reactions  . No Known Allergies     Patient Measurements: Height: 5\' 6"  (167.6 cm) Weight: 250 lb (113.4 kg) IBW/kg (Calculated) : 63.8  Vital Signs: Temp: 98.7 F (37.1 C) (10/19 0430) Temp Source: Oral (10/19 0430) BP: 150/81 (10/19 0430) Pulse Rate: 97 (10/19 0430)  Labs:  Recent Labs  02/19/16 0700 02/19/16 1557 02/20/16 0919 02/21/16 0331  HGB 13.4 13.2 13.4  --   HCT 40.0 39.7 39.7  --   PLT 198 200 203  --   APTT 30  --   --   --   LABPROT 13.5  --  14.6 15.3*  INR 1.03  --  1.13 1.20  CREATININE 0.76 0.85 0.80 0.75    Estimated Creatinine Clearance: 121.9 mL/min (by C-G formula based on SCr of 0.75 mg/dL).   Assessment: [age] YOM s/p ORIF on 10/17 and started on warfarin for anticoagulation post-op. Baseline INR 1.03.  INR remains SUBtherapeutic today (INR 1.2, goal of 2-3). CBC wnl and stable - no overt s/sx of bleeding noted. Continues on lovenox for VTE prophylaxis until INR>/=2  Goal of Therapy:  INR 2-3   Plan:  Repeat Warfarin 7.5 mg x 1 dose at 1800 today Monitor daily INR, CBC, clinical course, s/sx of bleed, PO intake, DDI   Thank you for allowing us to participate in this patients care. Signe Coltonya C Tyrin Herbers, PharmD Pager: [phone] Clinical phone for 02/21/2016 from 7a-3:30p: [phone] If after 3:30p, please call main pharmacy at: [phone] 02/21/2016 2:27 PM

## 2016-02-21 NOTE — Progress Notes (Signed)
Physical Therapy Treatment Patient Details Name: Adrian Rivas MRN: [id number] DOB: [birth year] Today's Date: 02/21/2016    History of Present Illness Adrian Rivas is an 55 y.o. white male who sustained a fall this morning. Pt presents with a Tibial Plateau fx and pt s/p ORIF.    PT Comments    Pt remains to progress slowly.  Required +2 assist and would benefit from CIR for continued rehab before returning home.  Informed supervising PT on patient need for post acute rehab before returning home.  Informed RN.        Follow Up Recommendations  CIR;Supervision/Assistance - 24 hour     Equipment Recommendations   (TBD at next venue.  )    Recommendations for Other Services       Precautions / Restrictions Precautions Precautions: Fall;Knee Required Braces or Orthoses: Knee Immobilizer - Left (Pt now with a hinged L knee brace.  ) Restrictions Weight Bearing Restrictions: Yes LLE Weight Bearing: Non weight bearing    Mobility  Bed Mobility Overal bed mobility: Needs Assistance Bed Mobility: Rolling;Sidelying to Sit Rolling: Mod assist;+2 for physical assistance (for log rolling to the R.  ) Sidelying to sit: Mod assist;+2 for physical assistance       General bed mobility comments: Mod assist +2 for rolling and advance B LEs to edge of bed during sidelying to sit.    Transfers Overall transfer level: Needs assistance Equipment used: Rolling walker (2 wheeled) Transfers: Sit to/from Stand Sit to Stand: Min assist;+2 physical assistance         General transfer comment: Cues for hand placement and forward weight shifting.  Pt required cues for sequencing and PTA supported L foot to maintain NWB.  Pt remains to require constant VCs to keep LLE on the floor.    Ambulation/Gait Ambulation/Gait assistance: Min assist;+2 physical assistance Ambulation Distance (Feet): 4 Feet Assistive device: Rolling walker (2 wheeled) Gait Pattern/deviations: Step-to  pattern;Antalgic;Decreased stride length   Gait velocity interpretation: Below normal speed for age/gender General Gait Details: Cues for sequencing and RW advancement.     Stairs            Wheelchair Mobility    Modified Rankin (Stroke Patients Only)       Balance Overall balance assessment: Needs assistance   Sitting balance-Leahy Scale: Good       Standing balance-Leahy Scale: Poor                      Cognition Arousal/Alertness: Awake/alert Behavior During Therapy: WFL for tasks assessed/performed Overall Cognitive Status: Within Functional Limits for tasks assessed                      Exercises Total Joint Exercises Ankle Circles/Pumps: AROM;Both;10 reps (x3) Quad Sets: AROM;Right;10 reps;Supine (x2 sets.  ) Straight Leg Raises: AROM;Right;10 reps;Supine (x2 sets)    General Comments        Pertinent Vitals/Pain Pain Assessment: Faces Faces Pain Scale: Hurts even more Pain Location: L knee Pain Descriptors / Indicators: Discomfort Pain Intervention(s): Monitored during session;Repositioned;Ice applied    Home Living                      Prior Function            PT Goals (current goals can now be found in the care plan section) Acute Rehab PT Goals Patient Stated Goal: To walk Potential to Achieve Goals: Good Progress  towards PT goals: Progressing toward goals    Frequency    Min 5X/week      PT Plan Discharge plan needs to be updated    Co-evaluation PT/OT/SLP Co-Evaluation/Treatment: Yes Reason for Co-Treatment: Complexity of the patient's impairments (multi-system involvement);For patient/therapist safety PT goals addressed during session: Mobility/safety with mobility;Balance;Proper use of DME;Strengthening/ROM OT goals addressed during session: ADL's and self-care;Proper use of Adaptive equipment and DME     End of Session Equipment Utilized During Treatment: Gait belt;Left knee  immobilizer Activity Tolerance: Patient tolerated treatment well;Patient limited by pain Patient left: in chair;with call bell/phone within reach     Time: 1447-1526 PT Time Calculation (min) (ACUTE ONLY): 39 min  Charges:  $Therapeutic Exercise: 8-22 mins $Therapeutic Activity: 8-22 mins                    G Codes:      Florestine Avers 03-01-16, 3:27 PM  Joycelyn Rua, PTA pager [phone]

## 2016-02-21 NOTE — Progress Notes (Signed)
Orthopaedic Trauma Service (OTS)   Subjective: Patient reports pain as moderate.  In chair. Would like Rehab placement.  Objective: Temp:  [98.7 F (37.1 C)-100.8 F (38.2 C)] 98.7 F (37.1 C) (10/19 0430) Pulse Rate:  [95-113] 97 (10/19 0430) Resp:  [16-18] 16 (10/19 0430) BP: (127-150)/(57-83) 150/81 (10/19 0430) SpO2:  [86 %-97 %] 91 % (10/19 0900) Physical Exam LLE Dressing intact, clean, dry  Edema/ swelling controlled  Sens: DPN, SPN, TN intact  Motor: EHL, FHL, and lessor toe ext and flex all intact grossly  Brisk cap refill, warm to touch  Assessment/Plan: 1 Days Post-Op Procedure(s) (LRB): OPEN REDUCTION INTERNAL FIXATION (ORIF) TIBIAL PLATEAU (Left) ANTERIOR COMPARTMENT FASCIOTOMY (Left) 1. NWB LLE, full motion of knee and ankle 2. Pain control 3. D/c planning 4. Lovenox/ coumadin  Myrene GalasMichael Nanie Dunkleberger, MD [facility] [phone] (p)

## 2016-02-22 LAB — BASIC METABOLIC PANEL
Anion gap: 9 (ref 5–15)
BUN: 8 mg/dL (ref 6–20)
CALCIUM: 8.8 mg/dL — AB (ref 8.9–10.3)
CO2: 27 mmol/L (ref 22–32)
CREATININE: 0.75 mg/dL (ref 0.61–1.24)
Chloride: 105 mmol/L (ref 101–111)
GFR calc Af Amer: >60 mL/min (ref >60)
GLUCOSE: 118 mg/dL — AB (ref 65–99)
Potassium: 3.1 mmol/L — ABNORMAL LOW (ref 3.5–5.1)
Sodium: 141 mmol/L (ref 135–145)

## 2016-02-22 LAB — PROTIME-INR
INR: 1.89
Prothrombin Time: 21.9 seconds — ABNORMAL HIGH (ref 11.4–15.2)

## 2016-02-22 NOTE — Plan of Care (Signed)
Problem: Pain Managment: Goal: General experience of comfort will improve Outcome: Progressing Medicated once for pain with full relief

## 2016-02-22 NOTE — Progress Notes (Signed)
I have insurance approval from [facility] and inpt rehab bed to admit pt to on Saturday. I met with pt and his wife at bedside and they are in agreement to admit Saturday. I will make the arrangements to admit tomorrow. [phone]

## 2016-02-22 NOTE — PMR Pre-admission (Signed)
PMR Admission Coordinator Pre-Admission Assessment  Patient: Adrian Rivas is an 55 y.o., male MRN: [id number] DOB: January 23, 1961 Height: 5\' 6"  (167.6 cm) Weight: 113.4 kg (250 lb)              Insurance Information HMO:     PPO: yes     PCP:      IPA:      80/20:      OTHER:  PRIMARY: [employer] [place]      Policy#: [id number]      Subscriber: wife CM Name: LaDester    Phone#: [phone]     Fax#: [phone] Pre-Cert#: [id number]    Approved 10/21 until 11/2 when updates are due  Employer:  Benefits:  Phone #: [phone]     Name: 02/22/16 Eff. Date: 11/03/15     Deduct: $1500      Out of Pocket Max: $4000      Life Max: none CIR: 80%      SNF: 80% 60 days Outpatient: 80%     Co-Pay: 30 visits PT and OT Home Health: 80%      Co-Pay: per medical neccessity DME: 80%     Co-Pay: 20% Providers: in network  SECONDARY: Medicare  A and B    Policy#: [id number]      Subscriber: pt  Medicaid Application Date:      Case Manager:  Disability Application Date:       Case Worker:   Emergency Contact Information Contact Information    Name Relation Home Work Mobile   Leap,[name] Spouse [phone]  [phone]   Verdis PrimeBailey,Tiffany Daughter [phone]  [phone]     Current Medical History  Patient Admitting Diagnosis: left bicondylar tibial plateau fracture  History of Present Illness:    : HPI: Sheppard Plumberimothy E Holtis a 55 y.o.right handed malewith history of COPD, chronic neck pain with history of anterior fusion as well as multiple low back surgeries. Patient is on disability from multiple back surgeries.  [facility] 02/15/2016 after a fall while going to the bathroom. Denied loss of consciousness. X-rays and imaging revealed left tibial plateau fracture. [facility] and underwent ORIF with anterior compartment fasciotomy 02/19/2016 per Dr. Carola FrostHandy. Nonweightbearing left lower extremity with hinged knee brace.  Hospital course  pain management. Subcutaneous Lovenox for DVT prophylaxis and plan to transition to Coumadin.  Past Medical History  Past Medical History:  Diagnosis Date  . Anxiety   . Arthritis   . COPD (chronic obstructive pulmonary disease) (HCC)    TOLD HE HAS COPD - QUIT SMOKING 15 YRS AGO-- NO C/O OF SOB  . Depression   . Dyslipidemia   . HTN (hypertension)   . Pain    CHRONIC NECK PAIN- HX OF ANTERIOR FUSION  . Pain    LOWER BACK WITH PAIN DOWN BOTH LEGS - BUT WORSE LEFT LEG WITH NUMBNESS OUTER SIDE OF LEFT LEG - HAS SPINAL STENOSIS    Family History  family history is not on file.  Prior Rehab/Hospitalizations:  Has the patient had major surgery during 100 days prior to admission? No  Current Medications   Current Facility-Administered Medications:  .  0.9 % NaCl with KCl 20 mEq/ L  infusion, , Intravenous, Continuous, Montez MoritaKeith Paul, PA-C, Last Rate: 100 mL/hr at 02/19/16 1552 .  acetaminophen (TYLENOL) tablet 650 mg, 650 mg, Oral, Q6H PRN **OR** acetaminophen (TYLENOL) suppository 650 mg, 650 mg, Rectal, Q6H PRN, Montez MoritaKeith Paul, PA-C .  acetaminophen (TYLENOL) tablet 650 mg, 650 mg, Oral, Q6H PRN **OR** acetaminophen (TYLENOL) suppository 650 mg, 650 mg, Rectal, Q6H PRN, Montez Morita, PA-C .  bisacodyl (DULCOLAX) EC tablet 5 mg, 5 mg, Oral, Daily PRN, Montez Morita, PA-C .  Brexpiprazole TABS 1 mg, 1 tablet, Oral, Daily, Montez Morita, PA-C, 1 mg at 02/22/16 0920 .  coumadin book, , Does not apply, Once, Scarlett Presto, [facility] .  desvenlafaxine (PRISTIQ) 24 hr tablet 100 mg, 100 mg, Oral, q morning - 10a, Montez Morita, PA-C, 100 mg at 02/22/16 0920 .  docusate sodium (COLACE) capsule 100 mg, 100 mg, Oral, BID, Montez Morita, PA-C, 100 mg at 02/22/16 0920 .  enoxaparin (LOVENOX) injection 40 mg, 40 mg, Subcutaneous, Q24H, Montez Morita, PA-C, 40 mg at 02/22/16 0919 .  HYDROmorphone (DILAUDID) injection 1 mg, 1 mg, Intravenous, Q2H PRN, Montez Morita, PA-C, 1 mg at 02/18/16 1301 .  lactated ringers infusion, ,  Intravenous, Continuous, Mal Amabile, MD, Last Rate: 10 mL/hr at 02/19/16 0816 .  lamoTRIgine (LAMICTAL) tablet 200 mg, 200 mg, Oral, q morning - 10a, Montez Morita, PA-C, 200 mg at 02/22/16 0919 .  lisinopril (PRINIVIL,ZESTRIL) tablet 40 mg, 40 mg, Oral, Daily, Montez Morita, PA-C, 40 mg at 02/22/16 0919 .  magnesium hydroxide (MILK OF MAGNESIA) suspension 30 mL, 30 mL, Oral, Daily PRN, Montez Morita, PA-C .  methocarbamol (ROBAXIN) tablet 1,000 mg, 1,000 mg, Oral, Q6H, Montez Morita, PA-C, 1,000 mg at 02/22/16 1254 .  metoCLOPramide (REGLAN) tablet 5-10 mg, 5-10 mg, Oral, Q8H PRN **OR** metoCLOPramide (REGLAN) injection 5-10 mg, 5-10 mg, Intravenous, Q8H PRN, Montez Morita, PA-C .  metoprolol succinate (TOPROL-XL) 24 hr tablet 25 mg, 25 mg, Oral, Daily, Montez Morita, PA-C, 25 mg at 02/22/16 0919 .  ondansetron (ZOFRAN) tablet 4 mg, 4 mg, Oral, Q6H PRN **OR** ondansetron (ZOFRAN) injection 4 mg, 4 mg, Intravenous, Q6H PRN, Montez Morita, PA-C .  ondansetron (ZOFRAN) tablet 4 mg, 4 mg, Oral, Q6H PRN **OR** ondansetron (ZOFRAN) injection 4 mg, 4 mg, Intravenous, Q6H PRN, Montez Morita, PA-C .  oxyCODONE (Oxy IR/ROXICODONE) immediate release tablet 5-10 mg, 5-10 mg, Oral, Q3H PRN, Montez Morita, PA-C, 10 mg at 02/22/16 1256 .  oxyCODONE-acetaminophen (PERCOCET/ROXICET) 5-325 MG per tablet 1-2 tablet, 1-2 tablet, Oral, Q6H PRN, Montez Morita, PA-C, 2 tablet at 02/21/16 2227 .  rosuvastatin (CRESTOR) tablet 20 mg, 20 mg, Oral, QHS, Montez Morita, PA-C, 20 mg at 02/21/16 2226 .  tamsulosin (FLOMAX) capsule 0.4 mg, 0.4 mg, Oral, Daily, Montez Morita, PA-C, 0.4 mg at 02/22/16 0920 .  Warfarin - Pharmacist Dosing Inpatient, , Does not apply, q1800, Scarlett Presto, RPH .  zolpidem [facility]) tablet 10 mg, 10 mg, Oral, QHS PRN, Montez Morita, PA-C, 10 mg at 02/21/16 2227  Patients Current Diet: Diet regular Room service appropriate? Yes; Fluid consistency: Thin  Precautions / Restrictions Precautions Precautions: Fall, Knee Precaution  Booklet Issued: No Other Brace/Splint: hinged knee brace LLE Restrictions Weight Bearing Restrictions: Yes LLE Weight Bearing: Non weight bearing   Has the patient had 2 or more falls or a fall with injury in the past year?Yes  Prior Activity Level Community (5-7x/wk): Independent and driving without AD pta. Disabled due to back issues  Journalist, newspaper / Equipment Home Assistive Devices/Equipment: Eyeglasses Home Equipment: Environmental consultant - 2 wheels, Walker - 4 wheels, Bedside commode  Prior Device Use: Indicate devices/aids used by the patient prior to current illness, exacerbation or injury? None of the above  Prior Functional Level Prior Function Level  of Independence: Independent  Self Care: Did the patient need help bathing, dressing, using the toilet or eating?  Independent  Indoor Mobility: Did the patient need assistance with walking from room to room (with or without device)? Independent  Stairs: Did the patient need assistance with internal or external stairs (with or without device)? Independent  Functional Cognition: Did the patient need help planning regular tasks such as shopping or remembering to take medications? Independent  Current Functional Level Cognition  Overall Cognitive Status: Within Functional Limits for tasks assessed Orientation Level: Oriented X4    Extremity Assessment (includes Sensation/Coordination)  Upper Extremity Assessment: Overall WFL for tasks assessed  Lower Extremity Assessment: LLE deficits/detail LLE Deficits / Details: decreased strength and ROM post-op LLE: Unable to fully assess due to pain    ADLs  Overall ADL's : Needs assistance/impaired Eating/Feeding: Set up, Sitting Grooming: Wash/dry hands, Wash/dry face, Set up, Sitting Grooming Details (indicate cue type and reason): unable to sustain standing for ADL at this time Upper Body Bathing: Set up, Sitting Lower Body Bathing: Total assistance, Bed level Lower Body  Bathing Details (indicate cue type and reason): Pt would benefit from education in AE  Upper Body Dressing : Set up, Sitting Lower Body Dressing: Total assistance, Cueing for sequencing, Sit to/from stand Lower Body Dressing Details (indicate cue type and reason): Pt unable to reach down to feet for LB dressing, Pt will benefit from AE education Toilet Transfer: Moderate assistance, +2 for physical assistance, Cueing for safety, Stand-pivot, RW Toilet Transfer Details (indicate cue type and reason): Pt unable to maintain NWB despite vc from therapists; simulated with recliner Toileting- Clothing Manipulation and Hygiene: Total assistance, Sit to/from stand Tub/Shower Transfer Details (indicate cue type and reason): Pt unable to maintain NWB during stand pivot, ambulation not attempted at this time. Functional mobility during ADLs: +2 for physical assistance, Moderate assistance, Cueing for sequencing, Rolling walker, Cueing for safety General ADL Comments: Pt willing to work with therapy despite high pain score. Pt motivated to increase independence. Great CIR candidate    Mobility  Overal bed mobility: Needs Assistance Bed Mobility: Rolling, Sidelying to Sit Rolling: Mod assist, +2 for physical assistance Sidelying to sit: Mod assist, +2 for physical assistance Supine to sit: Min assist General bed mobility comments: Mod assist +2 for rolling and advance B LEs to edge of bed during sidelying to sit.      Transfers  Overall transfer level: Needs assistance Equipment used: Rolling walker (2 wheeled) Transfers: Sit to/from Stand Sit to Stand: Min assist, +2 physical assistance General transfer comment: Cues for hand placement and forward weight shifting.  Pt required cues for sequencing and PTA supported L foot to maintain NWB.  Pt remains to require constant VCs to keep LLE off the floor.      Ambulation / Gait / Stairs / Wheelchair Mobility  Ambulation/Gait Ambulation/Gait assistance: Min  assist, +2 physical assistance Ambulation Distance (Feet): 4 Feet Assistive device: Rolling walker (2 wheeled) Gait Pattern/deviations: Step-to pattern, Antalgic, Decreased stride length General Gait Details: Cues for sequencing and RW advancement.   Gait velocity interpretation: Below normal speed for age/gender    Posture / Balance Balance Overall balance assessment: Needs assistance Sitting-balance support: No upper extremity supported, Feet supported Sitting balance-Leahy Scale: Good Standing balance support: During functional activity, Bilateral upper extremity supported Standing balance-Leahy Scale: Poor    Special needs/care consideration BiPAP/CPAP   N/a CPM  N/a Continuous Drip IV   N/a Dialysis   N/a Life Vest  N/a Oxygen pt reports he is not O2 dependent at home Special Bed   N/a Trach Size   N/a Wound Vac (area)   N/a Skin abrasion right knee and mid jaw; surgical incision Bowel mgmt: continent LBM 10/20 Bladder mgmt:  continent Diabetic mgmt   N/a   Previous Home Environment Living Arrangements: Spouse/significant other  Lives With: Spouse Available Help at Discharge: Family, Available PRN/intermittently Type of Home: House Home Layout: One level Home Access: Stairs to enter Secretary/administrator of Steps: 3 Bathroom Accessibility: Yes How Accessible: Accessible via walker Home Care Services: No  Discharge Living Setting Plans for Discharge Living Setting: Patient's home, Lives with (comment) (wife) Type of Home at Discharge: House Discharge Home Layout: One level Discharge Home Access: Stairs to enter Entrance Stairs-Rails: Right, Left (unable to reach both) Entrance Stairs-Number of Steps: 3 Discharge Bathroom Shower/Tub: Walk-in shower Discharge Bathroom Toilet: Standard Discharge Bathroom Accessibility: Yes How Accessible: Accessible via walker Does the patient have any problems obtaining your medications?: No  Social/Family/Support  Systems Patient Roles: Spouse, Parent Contact Information: Marylu Lund, wife Anticipated Caregiver: wife and family Anticipated Caregiver's Contact Information: see above Ability/Limitations of Caregiver: wife works days Caregiver Availability: Intermittent Discharge Plan Discussed with Primary Caregiver: Yes Is Caregiver In Agreement with Plan?: Yes Does Caregiver/Family have Issues with Lodging/Transportation while Pt is in Rehab?: No  Goals/Additional Needs Patient/Family Goal for Rehab: Mod I with PT and OT Expected length of stay: ELOS 7-10 dyas Pt/Family Agrees to Admission and willing to participate: Yes Program Orientation Provided & Reviewed with Pt/Caregiver Including Roles  & Responsibilities: Yes  Decrease burden of Care through IP rehab admission: n/a  Possible need for SNF placement upon discharge: not anticipated  Patient Condition: This patient's medical and functional status are the same since consult dated: 02/22/2016 in which the Rehabilitation Physician determined and documented that the patient's condition is appropriate for intensive rehabilitative care in an inpatient rehabilitation facility. The patient remains appropriate for inpatient rehab. Will admit to inpatient rehab 02/23/2016 when bed is available.  Preadmission Screen Completed By:  Clois Dupes, 02/22/2016 4:51 PM ______________________________________________________________________   Discussed status with Dr. Allena Katz on 02/23/2016 at  0805 and received telephone approval for admission today.  Admission Coordinator:  Clois Dupes, time 1191 Date 02/23/2016

## 2016-02-22 NOTE — Progress Notes (Signed)
I will begin insurance authorization with [employer] for a possible inpt rehab admission pending approval when pt medically ready. [phone]

## 2016-02-22 NOTE — Progress Notes (Signed)
Physical Therapy Treatment Patient Details Name: Adrian Rivas MRN: [id number] DOB: 13-Oct-1960 Today's Date: 02/22/2016    History of Present Illness Pt is an 55 y.o. male who sustained a fall in his bathroom 02/14/2016. Pt presents with a Tibial Plateau fx and pt s/p ORIF. Pt  has a past medical history of Anxiety; Arthritis; COPD (chronic obstructive pulmonary disease) (HCC); Depression; Dyslipidemia; HTN (hypertension); Chronic back and neck Pain; spinal stenosis, lumbar laminectomy/decompression (2014), cervical fusion.    PT Comments    Pt progressing well, remains to struggle with transfer techniques.  Continue to recommend CIR.    Follow Up Recommendations  CIR;Supervision/Assistance - 24 hour     Equipment Recommendations   (TBD at next venue.)    Recommendations for Other Services       Precautions / Restrictions Precautions Precautions: Fall;Knee Required Braces or Orthoses: Other Brace/Splint Other Brace/Splint: hinged knee brace LLE Restrictions Weight Bearing Restrictions: Yes LLE Weight Bearing: Non weight bearing    Mobility  Bed Mobility Overal bed mobility: Needs Assistance Bed Mobility: Supine to Sit     Supine to sit: Mod assist     General bed mobility comments: SPTA assisted pt to lower his LLE off edge of bed.  HOB elevated with heavy use of bedrail.  Transfers Overall transfer level: Needs assistance Equipment used: Rolling walker (2 wheeled) Transfers: Sit to/from [employer] Sit to Stand: Min assist;Mod assist;+2 physical assistance (Mod assist + 2 from bed (lower than previous session)  to WC/min assist + 2 from [facility] to chair) Stand pivot transfers: Min assist;+2 safety/equipment       General transfer comment: VCs provided for NWB status.  Pt. unable to maintain NWB status, presents with foot contacting floor during transfer.  VCs for hand placement to reach for armrest on seated  surface.  Ambulation/Gait Ambulation/Gait assistance: Min assist;+2 safety/equipment Ambulation Distance (Feet): 4 Feet (2 steps from bed to WC and 2 steps from [facility] to recliner.) Assistive device: Rolling walker (2 wheeled) Gait Pattern/deviations: Step-to pattern;Antalgic;Trunk flexed     General Gait Details: VCs needed to maintain NWB status and for placement of walker.  Assisted with placement of walker.   Stairs            Merchant navy officer mobility: Yes Wheelchair propulsion: Both upper extremities Wheelchair parts: Needs assistance Distance: 200 ft. Wheelchair Assistance Details (indicate cue type and reason): Pt. initaially required max assist for application of breaks and removing foot plates.  After verbal instruction pt. required supervision to remove foot plates and apply breaks.  VCs needed for forward propulsion, retropulsion, and turning .  Modified Rankin (Stroke Patients Only)       Balance Overall balance assessment: Needs assistance Sitting-balance support: Bilateral upper extremity supported Sitting balance-Leahy Scale: Good     Standing balance support: Bilateral upper extremity supported Standing balance-Leahy Scale: Poor Standing balance comment: Pt. required RW for standing.                    Cognition Arousal/Alertness: Awake/alert Behavior During Therapy: WFL for tasks assessed/performed Overall Cognitive Status: Within Functional Limits for tasks assessed                      Exercises Total Joint Exercises Ankle Circles/Pumps: AROM;Both;10 reps Quad Sets: AROM;Right;10 reps Short Arc Quad: AROM;Right;10 reps Hip ABduction/ADduction: AROM;Left;10 reps Straight Leg Raises: AROM;Right;10 reps Other Exercises Other Exercises: Heel cord stretch L ankle x 60 secs  General Comments        Pertinent Vitals/Pain Pain Assessment: 0-10 Pain Score: 9  Pain Location: LLE Pain Descriptors  / Indicators: Discomfort Pain Intervention(s): Monitored during session;Repositioned;Limited activity within patient's tolerance    Home Living                      Prior Function            PT Goals (current goals can now be found in the care plan section) Acute Rehab PT Goals Patient Stated Goal: To go to rehab. PT Goal Formulation: With patient Potential to Achieve Goals: Good Progress towards PT goals: Progressing toward goals    Frequency    Min 5X/week      PT Plan Current plan remains appropriate    Co-evaluation             End of Session Equipment Utilized During Treatment: Gait belt;Left knee immobilizer (Pt.s LLE immobilized in hinged knee brace.) Activity Tolerance: Patient tolerated treatment well Patient left: in chair;with call bell/phone within reach     Time: [id number] PT Time Calculation (min) (ACUTE ONLY): 49 min  Charges:  $Therapeutic Exercise: 8-22 mins $Therapeutic Activity: 8-22 mins $Wheel Chair Management: 8-22 mins                    G Codes:      Florestine Aversimee J Elva Mauro 02/22/2016, 5:09 PM  Joycelyn RuaAimee Kamori Barbier, PTA pager [phone] Tx, performed with Kendra OpitzSPTA, Robbie Lafar

## 2016-02-22 NOTE — Progress Notes (Signed)
ANTICOAGULATION CONSULT NOTE - Follow Up Consult  Pharmacy Consult for coumadin Indication: VTE prophylaxis  Allergies  Allergen Reactions  . No Known Allergies     Patient Measurements: Height: 5\' 6"  (167.6 cm) Weight: 250 lb (113.4 kg) IBW/kg (Calculated) : 63.8   Vital Signs: Temp: 98.7 F (37.1 C) (10/20 1229) Temp Source: Oral (10/20 1229) BP: 123/72 (10/20 1229) Pulse Rate: 83 (10/20 1229)  Labs:  Recent Labs  02/19/16 1557 02/20/16 0919 02/21/16 0331 02/22/16 0640  HGB 13.2 13.4  --   --   HCT 39.7 39.7  --   --   PLT 200 203  --   --   LABPROT  --  14.6 15.3* 21.9*  INR  --  1.13 1.20 1.89  CREATININE 0.85 0.80 0.75 0.75    Estimated Creatinine Clearance: 121.9 mL/min (by C-G formula based on SCr of 0.75 mg/dL).  Assessment: [name] s/p ORIF on 10/17 and started on warfarin for anticoagulation post-op. Baseline INR 1.03.  INR approaching goal. CBC previously wnl and stable - no overt s/sx of bleeding noted. Continues on lovenox for VTE prophylaxis until INR>/=2  Goal of Therapy:  INR 2-3 Monitor platelets by anticoagulation protocol: Yes   Plan:  Repeat Coumadin 7.5mg  Watch for s/s of bleeding D/C Lovenox when INR >= 2  Marisue HumbleKendra Cherolyn Behrle, PharmD Clinical Pharmacist [facility]

## 2016-02-22 NOTE — H&P (Signed)
Physical Medicine and Rehabilitation Admission H&P    Chief Complaint  Patient presents with  . Knee Pain  : HPI: Adrian Rivas is a 55 y.o. right handed male with history of COPD, chronic neck pain with history of anterior fusion as well as multiple low back surgeries. Per chart review patient lives with spouse independent prior to admission. Patient is on disability from multiple back surgeries. Wife works during the day. One level home with 3 steps to entry. Presented to [facility] 02/15/2016 after a fall while going to the bathroom. Denied loss of consciousness. X-rays and imaging revealed left tibial plateau fracture. He was transferred to [facility] and underwent ORIF with anterior compartment fasciotomy 02/19/2016 per Dr. Marcelino Scot. Nonweightbearing left lower extremity with hinged knee brace. Hospital course pain management. Subcutaneous Lovenox for DVT prophylaxis and plan to transition to Coumadin.Physical and occupational therapy evaluations completed with recommendations of physical medicine rehabilitation consult. Patient was admitted for comprehensive rehabilitation program  ROS Constitutional: Negative for chills and fever.  HENT: Negative for hearing loss.   Eyes: Negative for blurred vision.  Respiratory: Negative for cough and shortness of breath.   Cardiovascular: Positive for leg swelling. Negative for chest pain and palpitations.  Gastrointestinal: Positive for constipation. Negative for nausea and vomiting.  Genitourinary: Negative for dysuria and hematuria.  Musculoskeletal: Positive for back pain.  Skin: Negative for rash.  Psychiatric/Behavioral: Positive for depression.       Anxiety  All other systems reviewed and are negative   Past Medical History:  Diagnosis Date  . Anxiety   . Arthritis   . COPD (chronic obstructive pulmonary disease) (HCC)    TOLD HE HAS COPD - QUIT SMOKING 15 YRS AGO-- NO C/O OF SOB  . Depression   . Dyslipidemia     . HTN (hypertension)   . Pain    CHRONIC NECK PAIN- HX OF ANTERIOR FUSION  . Pain    LOWER BACK WITH PAIN DOWN BOTH LEGS - BUT WORSE LEFT LEG WITH NUMBNESS OUTER SIDE OF LEFT LEG - HAS SPINAL STENOSIS   Past Surgical History:  Procedure Laterality Date  . "SMALL BODIES REMOVED" FROM BOTH SHOULDERS    . BACK SURGERY     LUMBAR LAMINECTOMY  . BILATERAL CARPAL TUNNEL RELEASE    . CERVICAL FUSION    . FASCIOTOMY Left 02/19/2016   Procedure: ANTERIOR COMPARTMENT FASCIOTOMY;  Surgeon: Altamese Garrison, MD;  Location: [facility];  Service: Orthopedics;  Laterality: Left;  . LUMBAR LAMINECTOMY/DECOMPRESSION MICRODISCECTOMY N/A 10/20/2012   Procedure: DECOMPRESSION L2-L3, L1-L2;  Surgeon: Johnn Hai, MD;  Location: [facility];  Service: Orthopedics;  Laterality: N/A;  . ORIF TIBIA PLATEAU Left 02/19/2016  . ORIF TIBIA PLATEAU Left 02/19/2016   Procedure: OPEN REDUCTION INTERNAL FIXATION (ORIF) TIBIAL PLATEAU;  Surgeon: Altamese Cumberland, MD;  Location: [place];  Service: Orthopedics;  Laterality: Left;  . RIGHT ACL RECONSTRUCTION     History reviewed. No pertinent family history. Social History:  reports that he has quit smoking. His smoking use included Cigarettes. He has a 20.00 pack-year smoking history. He has never used smokeless tobacco. He reports that he does not drink alcohol or use drugs. Allergies:  Allergies  Allergen Reactions  . No Known Allergies    Medications Prior to Admission  Medication Sig Dispense Refill  . cholecalciferol (VITAMIN D) 400 UNITS TABS Take 400 Units by mouth daily.    Marland Kitchen desvenlafaxine (PRISTIQ) 100 MG 24 hr tablet Take 100  mg by mouth every morning.    Marland Kitchen HYDROcodone-acetaminophen (NORCO) 10-325 MG tablet 1 tablet 3 (three) times daily as needed.  0  . lamoTRIgine (LAMICTAL) 100 MG tablet Take 200 mg by mouth every morning. TAKES 200 mg in the morning and 134m in the afternoon    . lisinopril (PRINIVIL,ZESTRIL) 40 MG tablet 1 tablet daily.  4  . methocarbamol  (ROBAXIN) 500 MG tablet Take 1 tablet (500 mg total) by mouth 4 (four) times daily. (Patient taking differently: Take 500 mg by mouth 3 (three) times daily. ) 45 tablet 1  . metoprolol succinate (TOPROL-XL) 25 MG 24 hr tablet 1 tablet daily.  4  . mometasone (NASONEX) 50 MCG/ACT nasal spray Place 2 sprays into the nose daily as needed.     . Multiple Vitamin (MULTIVITAMIN WITH MINERALS) TABS Take 1 tablet by mouth daily.    .Marland KitchenREXULTI 1 MG TABS 1 tablet daily.  5  . rosuvastatin (CRESTOR) 20 MG tablet Take 20 mg by mouth at bedtime.    . tamsulosin (FLOMAX) 0.4 MG CAPS capsule 1 capsule daily.  0  . zolpidem (AMBIEN) 10 MG tablet Take 10 mg by mouth at bedtime as needed for sleep.      Home: Home Living Family/patient expects to be discharged to:: Inpatient rehab Living Arrangements: Spouse/significant other Available Help at Discharge: Family, Available PRN/intermittently Type of Home: House Home Access: Stairs to enter [employer] Steps: 3 Home Layout: One level Home Equipment: WEnvironmental consultant- 2 wheels, Walker - 4 wheels, Bedside commode   Functional History: Prior Function Level of Independence: Independent  Functional Status:  Mobility: Bed Mobility Overal bed mobility: Needs Assistance Bed Mobility: Rolling, Sidelying to Sit Rolling: Mod assist, +2 for physical assistance Sidelying to sit: Mod assist, +2 for physical assistance Supine to sit: Min assist General bed mobility comments: Mod assist +2 for rolling and advance B LEs to edge of bed during sidelying to sit.   Transfers Overall transfer level: Needs assistance Equipment used: Rolling walker (2 wheeled) Transfers: Sit to/from Stand Sit to Stand: Min assist, +2 physical assistance General transfer comment: Cues for hand placement and forward weight shifting.  Pt required cues for sequencing and PTA supported L foot to maintain NWB.  Pt remains to require constant VCs to keep LLE off the floor.     Ambulation/Gait Ambulation/Gait assistance: Min assist, +2 physical assistance Ambulation Distance (Feet): 4 Feet Assistive device: Rolling walker (2 wheeled) Gait Pattern/deviations: Step-to pattern, Antalgic, Decreased stride length General Gait Details: Cues for sequencing and RW advancement.   Gait velocity interpretation: Below normal speed for age/gender    ADL: ADL Overall ADL's : Needs assistance/impaired Eating/Feeding: Set up, Sitting Grooming: Wash/dry hands, Wash/dry face, Set up, Sitting Grooming Details (indicate cue type and reason): unable to sustain standing for ADL at this time Upper Body Bathing: Set up, Sitting Lower Body Bathing: Total assistance, Bed level Lower Body Bathing Details (indicate cue type and reason): Pt would benefit from education in AE  Upper Body Dressing : Set up, Sitting Lower Body Dressing: Total assistance, Cueing for sequencing, Sit to/from stand Lower Body Dressing Details (indicate cue type and reason): Pt unable to reach down to feet for LB dressing, Pt will benefit from AE education Toilet Transfer: Moderate assistance, +2 for physical assistance, Cueing for safety, Stand-pivot, RW Toilet Transfer Details (indicate cue type and reason): Pt unable to maintain NWB despite vc from therapists; simulated with recliner Toileting- Clothing Manipulation and Hygiene: Total assistance,  Sit to/from stand Tub/Shower Transfer Details (indicate cue type and reason): Pt unable to maintain NWB during stand pivot, ambulation not attempted at this time. Functional mobility during ADLs: +2 for physical assistance, Moderate assistance, Cueing for sequencing, Rolling walker, Cueing for safety General ADL Comments: Pt willing to work with therapy despite high pain score. Pt motivated to increase independence. Great CIR candidate  Cognition: Cognition Overall Cognitive Status: Within Functional Limits for tasks assessed Orientation Level: Oriented  X4 Cognition Arousal/Alertness: Awake/alert Behavior During Therapy: WFL for tasks assessed/performed Overall Cognitive Status: Within Functional Limits for tasks assessed  Physical Exam: Blood pressure (!) 159/82, pulse 89, temperature 98.5 F (36.9 C), temperature source Oral, resp. rate 17, height _0  (1.676 m), weight 113.4 kg (250 lb), SpO2 95 %. Physical Exam Constitutional: He is oriented to person, place, and time. Well-developed. Obese. NAD. HENT:  Head: Normocephalic. Atraumatic. Eyes: EOM are normal. No discharge.  Neck: Normal range of motion. Neck supple. No thyromegaly present.  Cardiovascular: Normal rate and regular rhythm.   Respiratory: +[place]. Effort normal and breath sounds normal. No respiratory distress.  GI: Soft. Bowel sounds are normal. He exhibits no distension.  Musc: Hinged knee brace LLE Neurological: He is alert and oriented to person, place, and time.  Motor: B/l UE, RLE: 5/5 LLE: Hip flexion 3/5, ankle dorsi/plantar flexion 4/5 Skin: Left hip incision clean and dry    Results for orders placed or performed during the hospital encounter of 02/15/16 (from the past 48 hour(s))  CBC     Status: Abnormal   Collection Time: 02/20/16  9:19 AM  Result Value Ref Range   WBC 9.1 4.0 - 10.5 K/uL   RBC 3.85 (L) 4.22 - 5.81 MIL/uL   Hemoglobin 13.4 13.0 - 17.0 g/dL   HCT 39.7 39.0 - 52.0 %   MCV 103.1 (H) 78.0 - 100.0 fL   MCH 34.8 (H) 26.0 - 34.0 pg   MCHC 33.8 30.0 - 36.0 g/dL   RDW 13.0 11.5 - 15.5 %   Platelets 203 150 - 400 K/uL  Basic metabolic panel     Status: Abnormal   Collection Time: 02/20/16  9:19 AM  Result Value Ref Range   Sodium 141 135 - 145 mmol/L   Potassium 3.5 3.5 - 5.1 mmol/L   Chloride 107 101 - 111 mmol/L   CO2 24 22 - 32 mmol/L   Glucose, Bld 103 (H) 65 - 99 mg/dL   BUN 5 (L) 6 - 20 mg/dL   Creatinine, Ser 0.80 0.61 - 1.24 mg/dL   Calcium 8.9 8.9 - 10.3 mg/dL   GFR calc non Af Amer >60 >60 mL/min   GFR calc Af Amer >60 >60  mL/min    Comment: (NOTE) The eGFR has been calculated using the CKD EPI equation. This calculation has not been validated in all clinical situations. eGFR's persistently <60 mL/min signify possible Chronic Kidney Disease.    Anion gap 10 5 - 15  Protime-INR     Status: None   Collection Time: 02/20/16  9:19 AM  Result Value Ref Range   Prothrombin Time 14.6 11.4 - 15.2 seconds   INR 5.97   Basic metabolic panel     Status: Abnormal   Collection Time: 02/21/16  3:31 AM  Result Value Ref Range   Sodium 141 135 - 145 mmol/L   Potassium 3.1 (L) 3.5 - 5.1 mmol/L   Chloride 106 101 - 111 mmol/L   CO2 25 22 - 32 mmol/L  Glucose, Bld 107 (H) 65 - 99 mg/dL   BUN 8 6 - 20 mg/dL   Creatinine, Ser 0.75 0.61 - 1.24 mg/dL   Calcium 8.9 8.9 - 10.3 mg/dL   GFR calc non Af Amer >60 >60 mL/min   GFR calc Af Amer >60 >60 mL/min    Comment: (NOTE) The eGFR has been calculated using the CKD EPI equation. This calculation has not been validated in all clinical situations. eGFR's persistently <60 mL/min signify possible Chronic Kidney Disease.    Anion gap 10 5 - 15  Protime-INR     Status: Abnormal   Collection Time: 02/21/16  3:31 AM  Result Value Ref Range   Prothrombin Time 15.3 (H) 11.4 - 15.2 seconds   INR 1.20    No results found.   Medical Problem List and Plan: 1.  Decreased functional mobility secondary to left tibial plateau fracture status post ORIF 02/19/2016. Non-weightbearing 2.  DVT Prophylaxis/Anticoagulation: Coumadin per pharmacy protocol. Monitor for any bleeding episodes. Check vascular study 3. Pain Management: Oxycodone and Robaxin as needed. Monitor with increased mobility 4. Mood. Lamictal 200 mg daily, Pristiq 100 mg daily,Brexipiprazole 1 mg daily. 5. Neuropsych: This patient is capable of making decisions on his own behalf. 6. Skin/Wound Care: Routine skin checks 7. Fluids/Electrolytes/Nutrition: Routine I&O with follow-up chemistries 8. Hypertension.  Lisinopril 40 mg daily, Toprol 25 mg daily. Monitor with increased mobility 9. BPH. Flomax 0.4 mg daily. Check PVRs 3 10. Hyperlipidemia. Crestor 11. Hypokalemia: Follow BMP.  Post Admission Physician Evaluation: 1. Functional deficits secondary  to left tibial plateau fracture status post ORIF. 2. Patient is admitted to receive collaborative, interdisciplinary care between the physiatrist, rehab nursing staff, and therapy team. 3. Patient's level of medical complexity and substantial therapy needs in context of that medical necessity cannot be provided at a lesser intensity of care such as a SNF. 4. Patient has experienced substantial functional loss from his/her baseline which was documented above under the "Functional History" and "Functional Status" headings.  Judging by the patient's diagnosis, physical exam, and functional history, the patient has potential for functional progress which will result in measurable gains while on inpatient rehab.  These gains will be of substantial and practical use upon discharge  in facilitating mobility and self-care at the household level. 5. Physiatrist will provide 24 hour management of medical needs as well as oversight of the therapy plan/treatment and provide guidance as appropriate regarding the interaction of the two. 6. 24 hour rehab nursing will assist with bowel management, safety, skin/wound care, disease management, medication administration, pain management and patient education and help integrate therapy concepts, techniques,education, etc. 7. PT will assess and treat for/with: Lower extremity strength, range of motion, stamina, balance, functional mobility, safety, adaptive techniques and equipment, woundcare, coping skills, pain control, education.   Goals are: Mod I. 8. OT will assess and treat for/with: ADL's, functional mobility, safety, upper extremity strength, adaptive techniques and equipment, wound mgt, ego support, and community  reintegration.   Goals are: Mod I. Therapy may not proceed with showering this patient. 9. Case Management and Social Worker will assess and treat for psychological issues and discharge planning. 10. Team conference will be held weekly to assess progress toward goals and to determine barriers to discharge. 11. Patient will receive at least 3 hours of therapy per day at least 5 days per week. 12. ELOS: 7-10 days. 13. Prognosis:  excellent  Delice Lesch, MD, Mellody Drown 02/22/2016

## 2016-02-22 NOTE — Progress Notes (Signed)
I met with pt at bedside to discuss rehab venue. I await insurance decision and be availability for a possible inpt rehab admission. Bed is not available today. [phone]

## 2016-02-23 ENCOUNTER — Inpatient Hospital Stay (HOSPITAL_COMMUNITY)
Admission: RE | Admit: 2016-02-23 | Discharge: 2016-02-29 | DRG: 560 | Disposition: A | Discharge status: Home Health | Payer: BLUE CROSS/BLUE SHIELD | Source: Intra-hospital | Attending: Physical Medicine & Rehabilitation | Admitting: Physical Medicine & Rehabilitation

## 2016-02-23 ENCOUNTER — Encounter (HOSPITAL_COMMUNITY): Payer: Self-pay | Admitting: *Deleted

## 2016-02-23 DIAGNOSIS — S82142D Displaced bicondylar fracture of left tibia, subsequent encounter for closed fracture with routine healing: Secondary | ICD-10-CM | POA: Diagnosis present

## 2016-02-23 DIAGNOSIS — Z79899 Other long term (current) drug therapy: Secondary | ICD-10-CM

## 2016-02-23 DIAGNOSIS — S82142S Displaced bicondylar fracture of left tibia, sequela: Secondary | ICD-10-CM

## 2016-02-23 DIAGNOSIS — F418 Other specified anxiety disorders: Secondary | ICD-10-CM | POA: Diagnosis present

## 2016-02-23 DIAGNOSIS — R269 Unspecified abnormalities of gait and mobility: Secondary | ICD-10-CM

## 2016-02-23 DIAGNOSIS — N400 Enlarged prostate without lower urinary tract symptoms: Secondary | ICD-10-CM | POA: Diagnosis present

## 2016-02-23 DIAGNOSIS — S82142A Displaced bicondylar fracture of left tibia, initial encounter for closed fracture: Secondary | ICD-10-CM

## 2016-02-23 DIAGNOSIS — Z981 Arthrodesis status: Secondary | ICD-10-CM

## 2016-02-23 DIAGNOSIS — F5101 Primary insomnia: Secondary | ICD-10-CM | POA: Diagnosis not present

## 2016-02-23 DIAGNOSIS — G8918 Other acute postprocedural pain: Secondary | ICD-10-CM

## 2016-02-23 DIAGNOSIS — J449 Chronic obstructive pulmonary disease, unspecified: Secondary | ICD-10-CM | POA: Diagnosis present

## 2016-02-23 DIAGNOSIS — W19XXXD Unspecified fall, subsequent encounter: Secondary | ICD-10-CM | POA: Diagnosis present

## 2016-02-23 DIAGNOSIS — E785 Hyperlipidemia, unspecified: Secondary | ICD-10-CM | POA: Diagnosis present

## 2016-02-23 DIAGNOSIS — G4700 Insomnia, unspecified: Secondary | ICD-10-CM | POA: Diagnosis present

## 2016-02-23 DIAGNOSIS — M79609 Pain in unspecified limb: Secondary | ICD-10-CM | POA: Diagnosis not present

## 2016-02-23 DIAGNOSIS — Z6841 Body Mass Index (BMI) 40.0 and over, adult: Secondary | ICD-10-CM

## 2016-02-23 DIAGNOSIS — R791 Abnormal coagulation profile: Secondary | ICD-10-CM | POA: Diagnosis not present

## 2016-02-23 DIAGNOSIS — I10 Essential (primary) hypertension: Secondary | ICD-10-CM | POA: Diagnosis present

## 2016-02-23 DIAGNOSIS — E876 Hypokalemia: Secondary | ICD-10-CM

## 2016-02-23 DIAGNOSIS — S72009A Fracture of unspecified part of neck of unspecified femur, initial encounter for closed fracture: Secondary | ICD-10-CM | POA: Diagnosis present

## 2016-02-23 LAB — TESTOSTERONE, % FREE: Testosterone-% Free: 1.7 % — ABNORMAL HIGH (ref 0.2–0.7)

## 2016-02-23 LAB — PROTIME-INR
INR: 1.95
Prothrombin Time: 22.5 seconds — ABNORMAL HIGH (ref 11.4–15.2)

## 2016-02-23 MED ORDER — WARFARIN SODIUM 5 MG PO TABS
10.0000 mg | ORAL_TABLET | Freq: Once | ORAL | Status: AC
  Administered 2016-02-23: 10 mg via ORAL
  Filled 2016-02-23: qty 2

## 2016-02-23 MED ORDER — METOPROLOL SUCCINATE ER 25 MG PO TB24
25.0000 mg | ORAL_TABLET | Freq: Every day | ORAL | Status: DC
  Administered 2016-02-24 – 2016-02-25 (×2): 25 mg via ORAL
  Filled 2016-02-23 (×2): qty 1

## 2016-02-23 MED ORDER — OXYCODONE HCL 5 MG PO TABS
5.0000 mg | ORAL_TABLET | ORAL | Status: DC | PRN
  Administered 2016-02-23 – 2016-02-27 (×20): 10 mg via ORAL
  Filled 2016-02-23 (×20): qty 2

## 2016-02-23 MED ORDER — ROSUVASTATIN CALCIUM 20 MG PO TABS
20.0000 mg | ORAL_TABLET | Freq: Every day | ORAL | Status: DC
  Administered 2016-02-23 – 2016-02-28 (×6): 20 mg via ORAL
  Filled 2016-02-23 (×6): qty 1

## 2016-02-23 MED ORDER — ENOXAPARIN SODIUM 40 MG/0.4ML ~~LOC~~ SOLN
40.0000 mg | SUBCUTANEOUS | Status: DC
  Filled 2016-02-23: qty 0.4

## 2016-02-23 MED ORDER — LISINOPRIL 40 MG PO TABS
40.0000 mg | ORAL_TABLET | Freq: Every day | ORAL | Status: DC
  Administered 2016-02-24 – 2016-02-29 (×6): 40 mg via ORAL
  Filled 2016-02-23 (×6): qty 1

## 2016-02-23 MED ORDER — METHOCARBAMOL 500 MG PO TABS
500.0000 mg | ORAL_TABLET | Freq: Four times a day (QID) | ORAL | Status: DC | PRN
  Administered 2016-02-23 – 2016-02-29 (×13): 500 mg via ORAL
  Filled 2016-02-23 (×14): qty 1

## 2016-02-23 MED ORDER — TAMSULOSIN HCL 0.4 MG PO CAPS
0.4000 mg | ORAL_CAPSULE | Freq: Every day | ORAL | Status: DC
  Administered 2016-02-24 – 2016-02-29 (×6): 0.4 mg via ORAL
  Filled 2016-02-23 (×6): qty 1

## 2016-02-23 MED ORDER — BREXPIPRAZOLE 1 MG PO TABS
1.0000 | ORAL_TABLET | Freq: Every day | ORAL | Status: DC
  Administered 2016-02-24 – 2016-02-29 (×6): 1 mg via ORAL
  Filled 2016-02-23 (×6): qty 1

## 2016-02-23 MED ORDER — WARFARIN - PHARMACIST DOSING INPATIENT: Freq: Every day | Status: DC

## 2016-02-23 MED ORDER — WARFARIN SODIUM 5 MG PO TABS: 10.0000 mg | ORAL_TABLET | Freq: Once | ORAL | Status: DC

## 2016-02-23 MED ORDER — DESVENLAFAXINE SUCCINATE ER 100 MG PO TB24
100.0000 mg | ORAL_TABLET | Freq: Every morning | ORAL | Status: DC
  Administered 2016-02-24 – 2016-02-29 (×6): 100 mg via ORAL
  Filled 2016-02-23 (×6): qty 1

## 2016-02-23 MED ORDER — LAMOTRIGINE 25 MG PO TABS
200.0000 mg | ORAL_TABLET | Freq: Every morning | ORAL | Status: DC
  Administered 2016-02-24 – 2016-02-29 (×6): 200 mg via ORAL
  Filled 2016-02-23 (×6): qty 2

## 2016-02-23 MED ORDER — SORBITOL 70 % SOLN: 30.0000 mL | Freq: Every day | Status: DC | PRN

## 2016-02-23 NOTE — Progress Notes (Signed)
Physical Therapy Treatment Patient Details Name: Adrian Rivas MRN: [id number] DOB: [birth year] Today's Date: 02/23/2016    History of Present Illness Pt is an [age] y.o. male who sustained a fall in his bathroom 02/14/2016. Pt presents with a Tibial Plateau fx and pt s/p ORIF. Pt  has a past medical history of Anxiety; Arthritis; COPD (chronic obstructive pulmonary disease) (HCC); Depression; Dyslipidemia; HTN (hypertension); Chronic back and neck Pain; spinal stenosis, lumbar laminectomy/decompression (2014), cervical fusion.    PT Comments    Pt presented supine in bed with HOB elevated, awake and willing to participate in therapy session. Pt making good progress with ambulation; however, he is unable to maintain full NWB L LE during gait even with max VC'ing. PT encouraged pt to have a family member or friend bring in a tennis shoe for him to wear on his R foot to hopefully help him maintain NWB L LE during ambulation. Pt agreeable to plan. Pt would continue to benefit from skilled physical therapy services at this time while admitted and after d/c to address his limitations in order to improve his overall safety and independence with functional mobility.   Follow Up Recommendations  CIR;Supervision/Assistance - 24 hour     Equipment Recommendations  None recommended by PT    Recommendations for Other Services OT consult     Precautions / Restrictions Precautions Precautions: Fall;Knee Required Braces or Orthoses: Other Brace/Splint Other Brace/Splint: hinged knee brace LLE Restrictions Weight Bearing Restrictions: Yes LLE Weight Bearing: Non weight bearing    Mobility  Bed Mobility Overal bed mobility: Needs Assistance Bed Mobility: Supine to Sit     Supine to sit: Mod assist;HOB elevated     General bed mobility comments: pt required increased time and mod A with movement of L LE to lower to floor.  Transfers Overall transfer level: Needs assistance Equipment used:  Rolling walker (2 wheeled) Transfers: Sit to/from Stand Sit to Stand: Min assist         General transfer comment: VCs provided for NWB status.  Pt. unable to maintain NWB status, presents with foot contacting floor during transfer.  VCs for hand placement to reach for armrest on seated surface.  Ambulation/Gait Ambulation/Gait assistance: Min assist;+2 safety/equipment Ambulation Distance (Feet): 20 Feet (20' x2 with standing rest break in between) Assistive device: Rolling walker (2 wheeled) Gait Pattern/deviations: Step-to pattern (hop-to pattern to maintain NWB L LE) Gait velocity: decreased Gait velocity interpretation: Below normal speed for age/gender General Gait Details: pt required VC'ing to maintain NWB L LE   Stairs            Wheelchair Mobility    Modified Rankin (Stroke Patients Only)       Balance Overall balance assessment: Needs assistance   Sitting balance-Leahy Scale: Fair     Standing balance support: During functional activity;Bilateral upper extremity supported Standing balance-Leahy Scale: Poor Standing balance comment: pt reliant on bilateral UEs on RW                    Cognition Arousal/Alertness: Awake/alert Behavior During Therapy: WFL for tasks assessed/performed Overall Cognitive Status: Within Functional Limits for tasks assessed                      Exercises Total Joint Exercises Ankle Circles/Pumps: AROM;Left;10 reps;Seated    General Comments        Pertinent Vitals/Pain Pain Assessment: 0-10 Pain Score: 9  Pain Location: L knee Pain Descriptors / Indicators:  Guarding Pain Intervention(s): Monitored during session;Repositioned    Home Living                      Prior Function            PT Goals (current goals can now be found in the care plan section) Acute Rehab PT Goals Patient Stated Goal: To go to rehab. PT Goal Formulation: With patient Time For Goal Achievement:  02/27/16 Potential to Achieve Goals: Good Progress towards PT goals: Progressing toward goals    Frequency    Min 5X/week      PT Plan Current plan remains appropriate    Co-evaluation             End of Session Equipment Utilized During Treatment: Gait belt;Other (comment) (L hinged brace) Activity Tolerance: Patient tolerated treatment well Patient left: in chair;with call bell/phone within reach     Time: 1420-1444 PT Time Calculation (min) (ACUTE ONLY): 24 min  Charges:  $Gait Training: 23-37 mins                    G CodesAlessandra Bevels Elyon Zoll 03-04-16, 4:47 PM Deborah Chalk, PT, DPT [phone]

## 2016-02-23 NOTE — Interval H&P Note (Signed)
Adrian Rivas was admitted today to Inpatient Rehabilitation with the diagnosis of left tibial plateau fracture s/p ORIF.  The patient's history has been reviewed, patient examined, and there is no change in status.  Patient continues to be appropriate for intensive inpatient rehabilitation.  I have reviewed the patient's chart and labs.  Questions were answered to the patient's satisfaction. The PAPE has been reviewed and assessment remains appropriate.  Ankit Karis Jubanil Patel 02/23/2016, 9:07 PM

## 2016-02-23 NOTE — Progress Notes (Signed)
Notified [facility] that pt is going to inpt rehab.

## 2016-02-23 NOTE — Progress Notes (Signed)
Erick Colace, MD Physician Signed Physical Medicine and Rehabilitation  Consult Note Date of Service: 02/21/2016 11:32 AM  Related encounter: ED to Hosp-Admission (Current) from 02/15/2016 in [facility]     Expand All Collapse All   [] Hide copied text [] Hover for attribution information      Physical Medicine and Rehabilitation Consult Reason for Consult: Left bicondylar tibial plateau fracture Referring Physician: Dr. Carola Frost   HPI: Adrian Rivas is a 55 y.o. right handed male with history of COPD, chronic neck pain with history of anterior fusion as well as multiple low back surgeries. Per chart review patient lives with spouse independent prior to admission. Patient is on disability from multiple back surgeries. Wife works during the day. One level home with 3 steps to entry. Presented to [facility] 02/15/2016 after a fall while going to the bathroom. Denied loss of consciousness. X-rays and imaging revealed left tibial plateau fracture. He was transferred to [facility] and underwent ORIF with anterior compartment fasciotomy 02/19/2016 per Dr. Carola Frost. Nonweightbearing left lower extremity with hinged knee brace. Hospital course pain management. Subcutaneous Lovenox for DVT prophylaxis and plan to transition to Coumadin. Physical therapy evaluation completed 02/20/2016. M.D. has requested physical medicine rehabilitation consult  Patient is not O2 dependent. Discussed with occupational therapy, he is total assist for lower body ADLs, min/mod assist for transfers  Review of Systems  Constitutional: Negative for chills and fever.  HENT: Negative for hearing loss.   Eyes: Negative for blurred vision.  Respiratory: Negative for cough and shortness of breath.   Cardiovascular: Positive for leg swelling. Negative for chest pain and palpitations.  Gastrointestinal: Positive for constipation. Negative for nausea and vomiting.    Genitourinary: Negative for dysuria and hematuria.  Musculoskeletal: Positive for back pain.  Skin: Negative for rash.  Neurological: Positive for headaches.  Psychiatric/Behavioral: Positive for depression.       Anxiety  All other systems reviewed and are negative.      Past Medical History:  Diagnosis Date  . Anxiety   . Arthritis   . COPD (chronic obstructive pulmonary disease) (HCC)    TOLD HE HAS COPD - QUIT SMOKING 15 YRS AGO-- NO C/O OF SOB  . Depression   . Dyslipidemia   . HTN (hypertension)   . Pain    CHRONIC NECK PAIN- HX OF ANTERIOR FUSION  . Pain    LOWER BACK WITH PAIN DOWN BOTH LEGS - BUT WORSE LEFT LEG WITH NUMBNESS OUTER SIDE OF LEFT LEG - HAS SPINAL STENOSIS        Past Surgical History:  Procedure Laterality Date  . "SMALL BODIES REMOVED" FROM BOTH SHOULDERS    . BACK SURGERY     LUMBAR LAMINECTOMY  . BILATERAL CARPAL TUNNEL RELEASE    . CERVICAL FUSION    . FASCIOTOMY Left 02/19/2016   Procedure: ANTERIOR COMPARTMENT FASCIOTOMY;  Surgeon: Myrene Galas, MD;  Location: [facility] OR;  Service: Orthopedics;  Laterality: Left;  . LUMBAR LAMINECTOMY/DECOMPRESSION MICRODISCECTOMY N/A 10/20/2012   Procedure: DECOMPRESSION L2-L3, L1-L2;  Surgeon: Javier Docker, MD;  Location: [facility] ORS;  Service: Orthopedics;  Laterality: N/A;  . ORIF TIBIA PLATEAU Left 02/19/2016  . ORIF TIBIA PLATEAU Left 02/19/2016   Procedure: OPEN REDUCTION INTERNAL FIXATION (ORIF) TIBIAL PLATEAU;  Surgeon: Myrene Galas, MD;  Location: [facility] OR;  Service: Orthopedics;  Laterality: Left;  . RIGHT ACL RECONSTRUCTION     History reviewed. No pertinent family history. Social History:  reports that he has quit smoking. His smoking use included Cigarettes. He has a 20.00 pack-year smoking history. He has never used smokeless tobacco. He reports that he does not drink alcohol or use drugs. Allergies:      Allergies  Allergen Reactions  . No Known Allergies           Medications Prior to Admission  Medication Sig Dispense Refill  . cholecalciferol (VITAMIN D) 400 UNITS TABS Take 400 Units by mouth daily.    Marland Kitchen. desvenlafaxine (PRISTIQ) 100 MG 24 hr tablet Take 100 mg by mouth every morning.    Marland Kitchen. HYDROcodone-acetaminophen (NORCO) 10-325 MG tablet 1 tablet 3 (three) times daily as needed.  0  . lamoTRIgine (LAMICTAL) 100 MG tablet Take 200 mg by mouth every morning. TAKES 200 mg in the morning and 100mg  in the afternoon    . lisinopril (PRINIVIL,ZESTRIL) 40 MG tablet 1 tablet daily.  4  . methocarbamol (ROBAXIN) 500 MG tablet Take 1 tablet (500 mg total) by mouth 4 (four) times daily. (Patient taking differently: Take 500 mg by mouth 3 (three) times daily. ) 45 tablet 1  . metoprolol succinate (TOPROL-XL) 25 MG 24 hr tablet 1 tablet daily.  4  . mometasone (NASONEX) 50 MCG/ACT nasal spray Place 2 sprays into the nose daily as needed.     . Multiple Vitamin (MULTIVITAMIN WITH MINERALS) TABS Take 1 tablet by mouth daily.    Marland Kitchen. REXULTI 1 MG TABS 1 tablet daily.  5  . rosuvastatin (CRESTOR) 20 MG tablet Take 20 mg by mouth at bedtime.    . tamsulosin (FLOMAX) 0.4 MG CAPS capsule 1 capsule daily.  0  . zolpidem (AMBIEN) 10 MG tablet Take 10 mg by mouth at bedtime as needed for sleep.      Home: Home Living Family/patient expects to be discharged to:: Private residence Living Arrangements: Spouse/significant other Available Help at Discharge: Family Type of Home: House Home Access: Stairs to enter Secretary/administratorntrance Stairs-Number of Steps: 3 Home Layout: One level Home Equipment: Environmental consultantWalker - 2 wheels, Environmental consultantWalker - 4 wheels  Functional History: Prior Function Level of Independence: Independent Functional Status:  Mobility: Bed Mobility Overal bed mobility: Needs Assistance Bed Mobility: Supine to Sit Supine to sit: Min assist General bed mobility comments: min assist to lift LE, verbal cues for technique to increase independence, use of bed  rail Transfers Overall transfer level: Needs assistance Equipment used: Rolling walker (2 wheeled) Transfers: Sit to/from Stand Sit to Stand: Min assist General transfer comment: cues for technique and therapist holding Left LE into NWB, pt had difficulty holding left foot into DF and wanted to bear weigh on his toes.      ADL:    Cognition: Cognition Overall Cognitive Status: Within Functional Limits for tasks assessed Orientation Level: Oriented X4 Cognition Arousal/Alertness: Awake/alert Behavior During Therapy: WFL for tasks assessed/performed Overall Cognitive Status: Within Functional Limits for tasks assessed  Blood pressure (!) 150/81, pulse 97, temperature 98.7 F (37.1 C), temperature source Oral, resp. rate 16, height 5\' 6"  (1.676 m), weight 113.4 kg (250 lb), SpO2 91 %. Physical Exam  Constitutional: He is oriented to person, place, and time.  HENT:  Head: Normocephalic.  Eyes: EOM are normal.  Neck: Normal range of motion. Neck supple. No thyromegaly present.  Cardiovascular: Normal rate and regular rhythm.   Respiratory: Effort normal and breath sounds normal. No respiratory distress.  GI: Soft. Bowel sounds are normal. He exhibits no distension.  Neurological: He is alert and  oriented to person, place, and time.  Skin:  Left hip incision clean and dry    Lab Results Last 24 Hours       Results for orders placed or performed during the hospital encounter of 02/15/16 (from the past 24 hour(s))  Basic metabolic panel     Status: Abnormal   Collection Time: 02/21/16  3:31 AM  Result Value Ref Range   Sodium 141 135 - 145 mmol/L   Potassium 3.1 (L) 3.5 - 5.1 mmol/L   Chloride 106 101 - 111 mmol/L   CO2 25 22 - 32 mmol/L   Glucose, Bld 107 (H) 65 - 99 mg/dL   BUN 8 6 - 20 mg/dL   Creatinine, Ser 1.61 0.61 - 1.24 mg/dL   Calcium 8.9 8.9 - 09.6 mg/dL   GFR calc non Af Amer >60 >60 mL/min   GFR calc Af Amer >60 >60 mL/min   Anion gap 10 5 -  15  Protime-INR     Status: Abnormal   Collection Time: 02/21/16  3:31 AM  Result Value Ref Range   Prothrombin Time 15.3 (H) 11.4 - 15.2 seconds   INR 1.20       Imaging Results (Last 48 hours)  Dg Knee Left Port  Result Date: 02/19/2016 CLINICAL DATA:  Status open reduction internal fixation for tibial plateau fracture. EXAM: PORTABLE LEFT KNEE - 1-2 VIEW Intraoperative images obtained earlier in the day ; CT left knee February 16, 2016 FINDINGS: Frontal and lateral views were obtained. There is screw and plate fixation transfixing comminuted fracture of the proximal tibia extending into the intracondylar region. Alignment is essentially anatomic. No other fractures. No dislocations. There is a joint effusion. There is slight patellofemoral joint space narrowing. IMPRESSION: Screw and plate fixation transfixing comminuted fractures of the proximal tibia with alignment essentially anatomic. Moderate joint effusion. No other fractures. No dislocation. 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 Electronically Signed   By: Bretta Bang III M.D.   On: 02/19/2016 14:55     Assessment/Plan: Diagnosis: Left tibial plateau fracture status post ORIF 02/19/2016 with nonweightbearing 1. Does the need for close, 24 hr/day medical supervision in concert with the patient's rehab needs make it unreasonable for this patient to be served in a less intensive setting? Yes 2. Co-Morbidities requiring supervision/potential complications: Hypokalemia, pain control, COPD 3. Due to bladder management, bowel management, safety, skin/wound care, disease management, medication administration, pain management and patient education, does the patient require 24 hr/day rehab nursing? Yes 4. Does the patient require coordinated care of a physician, rehab nurse, PT (1-2 hrs/day, 5 days/week) and OT (1-2 hrs/day, 5 days/week) to address physical and functional deficits in the context of the above  medical diagnosis(es)? Yes Addressing deficits in the following areas: balance, endurance, locomotion, strength, transferring, bowel/bladder control, bathing, dressing, feeding, toileting and psychosocial support 5. Can the patient actively participate in an intensive therapy program of at least 3 hrs of therapy per day at least 5 days per week? Yes 6. The potential for patient to make measurable gains while on inpatient rehab is excellent 7. Anticipated functional outcomes upon discharge from inpatient rehab are modified independent  with PT, modified independent with OT, n/a with SLP. 8. Estimated rehab length of stay to reach the above functional goals is: 7-10 days 9. Does the patient have adequate social supports and living environment to accommodate these discharge functional goals? Potentially 10. Anticipated D/C setting: Home 11. Anticipated post D/C treatments: HH therapy 12. Overall  Rehab/Functional Prognosis: excellent  RECOMMENDATIONS: This patient's condition is appropriate for continued rehabilitative care in the following setting: CIR Patient has agreed to participate in recommended program. Yes Note that insurance prior authorization may be required for reimbursement for recommended care.  Comment:     02/21/2016    Revision History                        Routing History

## 2016-02-23 NOTE — Progress Notes (Signed)
Patient Transferred to [facility]. Report given to RN.

## 2016-02-23 NOTE — Progress Notes (Signed)
Standley Brooking, RN Rehab Admission Coordinator Signed Physical Medicine and Rehabilitation  PMR Pre-admission Date of Service: 02/22/2016 2:18 PM  Related encounter: ED to Hosp-Admission (Current) from 02/15/2016 in [facility]       [] Hide copied text PMR Admission Coordinator Pre-Admission Assessment  Patient: Adrian Rivas is an 55 y.o., male MRN: [id number] DOB: 08/17/1960 Height: 5\' 6"  (167.6 cm) Weight: 113.4 kg (250 lb)                                                                                                                                                  Insurance Information HMO:     PPO: yes     PCP:      IPA:      80/20:      OTHER:  PRIMARY: [employer] [place]      Policy#: [id number]      Subscriber: wife CM Name: LaDester    Phone#: [phone] ext [phone]     Fax#: [phone] Pre-Cert#: [id number]    Approved 10/21 until 11/2 when updates are due  Employer:  Benefits:  Phone #: [phone]     Name: 02/22/16 Eff. Date: 11/03/15     Deduct: $1500      Out of Pocket Max: $4000      Life Max: none CIR: 80%      SNF: 80% 60 days Outpatient: 80%     Co-Pay: 30 visits PT and OT Home Health: 80%      Co-Pay: per medical neccessity DME: 80%     Co-Pay: 20% Providers: in network  SECONDARY: Medicare  A and B    Policy#: [id number]      Subscriber: pt  Medicaid Application Date:      Case Manager:  Disability Application Date:       Case Worker:   Emergency Contact Information        Contact Information    Name Relation Home Work Mobile   Voorhies,[name] Spouse [phone]  [phone]   Verdis Prime Daughter [phone]  [phone]     Current Medical History  Patient Admitting Diagnosis: left bicondylar tibial plateau fracture  History of Present Illness:    : HPI: Aniketh Huberty Holtis a 55 y.o.right handed malewith history of COPD, chronic neck pain with history of anterior fusion as well as multiple low back  surgeries. Patient is on disability from multiple back surgeries.  Presented to [facility] 02/15/2016 after a fall while going to the bathroom. Denied loss of consciousness. X-rays and imaging revealed left tibial plateau fracture. He was transferred to [facility] and underwent ORIF with anterior compartment fasciotomy 02/19/2016 per Dr. Carola Frost. Nonweightbearing left lower extremity with hinged knee brace.  Hospital course pain management. Subcutaneous Lovenox for DVT prophylaxis and plan to transition to Coumadin.  Past Medical History  Past Medical History:  Diagnosis Date  . Anxiety   . Arthritis   . COPD (chronic obstructive pulmonary disease) (HCC)    TOLD HE HAS COPD - QUIT SMOKING 15 YRS AGO-- NO C/O OF SOB  . Depression   . Dyslipidemia   . HTN (hypertension)   . Pain    CHRONIC NECK PAIN- HX OF ANTERIOR FUSION  . Pain    LOWER BACK WITH PAIN DOWN BOTH LEGS - BUT WORSE LEFT LEG WITH NUMBNESS OUTER SIDE OF LEFT LEG - HAS SPINAL STENOSIS    Family History  family history is not on file.  Prior Rehab/Hospitalizations:  Has the patient had major surgery during 100 days prior to admission? No  Current Medications   Current Facility-Administered Medications:  .  0.9 % NaCl with KCl 20 mEq/ L  infusion, , Intravenous, Continuous, Montez Morita, PA-C, Last Rate: 100 mL/hr at 02/19/16 1552 .  acetaminophen (TYLENOL) tablet 650 mg, 650 mg, Oral, Q6H PRN **OR** acetaminophen (TYLENOL) suppository 650 mg, 650 mg, Rectal, Q6H PRN, Montez Morita, PA-C .  acetaminophen (TYLENOL) tablet 650 mg, 650 mg, Oral, Q6H PRN **OR** acetaminophen (TYLENOL) suppository 650 mg, 650 mg, Rectal, Q6H PRN, Montez Morita, PA-C .  bisacodyl (DULCOLAX) EC tablet 5 mg, 5 mg, Oral, Daily PRN, Montez Morita, PA-C .  Brexpiprazole TABS 1 mg, 1 tablet, Oral, Daily, Montez Morita, PA-C, 1 mg at 02/22/16 0920 .  coumadin book, , Does not apply, Once, Scarlett Presto, [facility] .  desvenlafaxine  (PRISTIQ) 24 hr tablet 100 mg, 100 mg, Oral, q morning - 10a, Montez Morita, PA-C, 100 mg at 02/22/16 0920 .  docusate sodium (COLACE) capsule 100 mg, 100 mg, Oral, BID, Montez Morita, PA-C, 100 mg at 02/22/16 0920 .  enoxaparin (LOVENOX) injection 40 mg, 40 mg, Subcutaneous, Q24H, Montez Morita, PA-C, 40 mg at 02/22/16 0919 .  HYDROmorphone (DILAUDID) injection 1 mg, 1 mg, Intravenous, Q2H PRN, Montez Morita, PA-C, 1 mg at 02/18/16 1301 .  lactated ringers infusion, , Intravenous, Continuous, Mal Amabile, MD, Last Rate: 10 mL/hr at 02/19/16 0816 .  lamoTRIgine (LAMICTAL) tablet 200 mg, 200 mg, Oral, q morning - 10a, Montez Morita, PA-C, 200 mg at 02/22/16 0919 .  lisinopril (PRINIVIL,ZESTRIL) tablet 40 mg, 40 mg, Oral, Daily, Montez Morita, PA-C, 40 mg at 02/22/16 0919 .  magnesium hydroxide (MILK OF MAGNESIA) suspension 30 mL, 30 mL, Oral, Daily PRN, Montez Morita, PA-C .  methocarbamol (ROBAXIN) tablet 1,000 mg, 1,000 mg, Oral, Q6H, Montez Morita, PA-C, 1,000 mg at 02/22/16 1254 .  metoCLOPramide (REGLAN) tablet 5-10 mg, 5-10 mg, Oral, Q8H PRN **OR** metoCLOPramide (REGLAN) injection 5-10 mg, 5-10 mg, Intravenous, Q8H PRN, Montez Morita, PA-C .  metoprolol succinate (TOPROL-XL) 24 hr tablet 25 mg, 25 mg, Oral, Daily, Montez Morita, PA-C, 25 mg at 02/22/16 0919 .  ondansetron (ZOFRAN) tablet 4 mg, 4 mg, Oral, Q6H PRN **OR** ondansetron (ZOFRAN) injection 4 mg, 4 mg, Intravenous, Q6H PRN, Montez Morita, PA-C .  ondansetron (ZOFRAN) tablet 4 mg, 4 mg, Oral, Q6H PRN **OR** ondansetron (ZOFRAN) injection 4 mg, 4 mg, Intravenous, Q6H PRN, Montez Morita, PA-C .  oxyCODONE (Oxy IR/ROXICODONE) immediate release tablet 5-10 mg, 5-10 mg, Oral, Q3H PRN, Montez Morita, PA-C, 10 mg at 02/22/16 1256 .  oxyCODONE-acetaminophen (PERCOCET/ROXICET) 5-325 MG per tablet 1-2 tablet, 1-2 tablet, Oral, Q6H PRN, Montez Morita, PA-C, 2 tablet at 02/21/16 2227 .  rosuvastatin (CRESTOR) tablet 20 mg, 20 mg, Oral, QHS, Montez Morita, PA-C, 20 mg at 02/21/16  2226 .   tamsulosin (FLOMAX) capsule 0.4 mg, 0.4 mg, Oral, Daily, Montez MoritaKeith Paul, PA-C, 0.4 mg at 02/22/16 0920 .  Warfarin - Pharmacist Dosing Inpatient, , Does not apply, q1800, Scarlett Prestoheresa D Egan, RPH .  zolpidem [facility](AMBIEN) tablet 10 mg, 10 mg, Oral, QHS PRN, Montez MoritaKeith Paul, PA-C, 10 mg at 02/21/16 2227  Patients Current Diet: Diet regular Room service appropriate? Yes; Fluid consistency: Thin  Precautions / Restrictions Precautions Precautions: Fall, Knee Precaution Booklet Issued: No Other Brace/Splint: hinged knee brace LLE Restrictions Weight Bearing Restrictions: Yes LLE Weight Bearing: Non weight bearing   Has the patient had 2 or more falls or a fall with injury in the past year?Yes  Prior Activity Level Community (5-7x/wk): Independent and driving without AD pta. Disabled due to back issues  Journalist, newspaperHome Assistive Devices / Equipment Home Assistive Devices/Equipment: Eyeglasses Home Equipment: Environmental consultantWalker - 2 wheels, Walker - 4 wheels, Bedside commode  Prior Device Use: Indicate devices/aids used by the patient prior to current illness, exacerbation or injury? None of the above  Prior Functional Level Prior Function Level of Independence: Independent  Self Care: Did the patient need help bathing, dressing, using the toilet or eating?  Independent  Indoor Mobility: Did the patient need assistance with walking from room to room (with or without device)? Independent  Stairs: Did the patient need assistance with internal or external stairs (with or without device)? Independent  Functional Cognition: Did the patient need help planning regular tasks such as shopping or remembering to take medications? Independent  Current Functional Level Cognition Overall Cognitive Status: Within Functional Limits for tasks assessed Orientation Level: Oriented X4    Extremity Assessment (includes Sensation/Coordination) Upper Extremity Assessment: Overall WFL for tasks assessed  Lower Extremity Assessment:  LLE deficits/detail LLE Deficits / Details: decreased strength and ROM post-op LLE: Unable to fully assess due to pain   ADLs Overall ADL's : Needs assistance/impaired Eating/Feeding: Set up, Sitting Grooming: Wash/dry hands, Wash/dry face, Set up, Sitting Grooming Details (indicate cue type and reason): unable to sustain standing for ADL at this time Upper Body Bathing: Set up, Sitting Lower Body Bathing: Total assistance, Bed level Lower Body Bathing Details (indicate cue type and reason): Pt would benefit from education in AE  Upper Body Dressing : Set up, Sitting Lower Body Dressing: Total assistance, Cueing for sequencing, Sit to/from stand Lower Body Dressing Details (indicate cue type and reason): Pt unable to reach down to feet for LB dressing, Pt will benefit from AE education Toilet Transfer: Moderate assistance, +2 for physical assistance, Cueing for safety, Stand-pivot, RW Toilet Transfer Details (indicate cue type and reason): Pt unable to maintain NWB despite vc from therapists; simulated with recliner Toileting- Clothing Manipulation and Hygiene: Total assistance, Sit to/from stand Tub/Shower Transfer Details (indicate cue type and reason): Pt unable to maintain NWB during stand pivot, ambulation not attempted at this time. Functional mobility during ADLs: +2 for physical assistance, Moderate assistance, Cueing for sequencing, Rolling walker, Cueing for safety General ADL Comments: Pt willing to work with therapy despite high pain score. Pt motivated to increase independence. Great CIR candidate   Mobility Overal bed mobility: Needs Assistance Bed Mobility: Rolling, Sidelying to Sit Rolling: Mod assist, +2 for physical assistance Sidelying to sit: Mod assist, +2 for physical assistance Supine to sit: Min assist General bed mobility comments: Mod assist +2 for rolling and advance B LEs to edge of bed during sidelying to sit.     Transfers Overall transfer level: Needs  assistance Equipment used:  Rolling walker (2 wheeled) Transfers: Sit to/from Stand Sit to Stand: Min assist, +2 physical assistance General transfer comment: Cues for hand placement and forward weight shifting.  Pt required cues for sequencing and PTA supported L foot to maintain NWB.  Pt remains to require constant VCs to keep LLE off the floor.     Ambulation / Gait / Stairs / Wheelchair Mobility Ambulation/Gait Ambulation/Gait assistance: Min assist, +2 physical assistance Ambulation Distance (Feet): 4 Feet Assistive device: Rolling walker (2 wheeled) Gait Pattern/deviations: Step-to pattern, Antalgic, Decreased stride length General Gait Details: Cues for sequencing and RW advancement.   Gait velocity interpretation: Below normal speed for age/gender   Posture / Balance Balance Overall balance assessment: Needs assistance Sitting-balance support: No upper extremity supported, Feet supported Sitting balance-Leahy Scale: Good Standing balance support: During functional activity, Bilateral upper extremity supported Standing balance-Leahy Scale: Poor   Special needs/care consideration BiPAP/CPAP   N/a CPM  N/a Continuous Drip IV   N/a Dialysis   N/a Life Vest   N/a Oxygen pt reports he is not O2 dependent at home Special Bed   N/a Trach Size   N/a Wound Vac (area)   N/a Skin abrasion right knee and mid jaw; surgical incision Bowel mgmt: continent LBM 10/20 Bladder mgmt:  continent Diabetic mgmt   N/a   Previous Home Environment Living Arrangements: Spouse/significant other  Lives With: Spouse Available Help at Discharge: Family, Available PRN/intermittently Type of Home: House Home Layout: One level Home Access: Stairs to enter Secretary/administrator of Steps: 3 Bathroom Accessibility: Yes How Accessible: Accessible via walker Home Care Services: No  Discharge Living Setting Plans for Discharge Living Setting: Patient's home, Lives with (comment) (wife) Type of Home at  Discharge: House Discharge Home Layout: One level Discharge Home Access: Stairs to enter Entrance Stairs-Rails: Right, Left (unable to reach both) Entrance Stairs-Number of Steps: 3 Discharge Bathroom Shower/Tub: Walk-in shower Discharge Bathroom Toilet: Standard Discharge Bathroom Accessibility: Yes How Accessible: Accessible via walker Does the patient have any problems obtaining your medications?: No  Social/Family/Support Systems Patient Roles: Spouse, Parent Contact Information: Marylu Lund, wife Anticipated Caregiver: wife and family Anticipated Caregiver's Contact Information: see above Ability/Limitations of Caregiver: wife works days Caregiver Availability: Intermittent Discharge Plan Discussed with Primary Caregiver: Yes Is Caregiver In Agreement with Plan?: Yes Does Caregiver/Family have Issues with Lodging/Transportation while Pt is in Rehab?: No  Goals/Additional Needs Patient/Family Goal for Rehab: Mod I with PT and OT Expected length of stay: ELOS 7-10 dyas Pt/Family Agrees to Admission and willing to participate: Yes Program Orientation Provided & Reviewed with Pt/Caregiver Including Roles  & Responsibilities: Yes  Decrease burden of Care through IP rehab admission: n/a  Possible need for SNF placement upon discharge: not anticipated  Patient Condition: This patient's medical and functional status are the same since consult dated: 02/22/2016 in which the Rehabilitation Physician determined and documented that the patient's condition is appropriate for intensive rehabilitative care in an inpatient rehabilitation facility. The patient remains appropriate for inpatient rehab. Will admit to inpatient rehab 02/23/2016 when bed is available.  Preadmission Screen Completed By:  Clois Dupes, 02/22/2016 4:51 PM ______________________________________________________________________   Discussed status with Dr. Allena Katz on 02/23/2016 at  0805 and received telephone  approval for admission today.  Admission Coordinator:  Clois Dupes, time 1610 Date 02/23/2016       Cosigned by: Ankit Karis Juba, MD at 02/23/2016 9:17 AM  Revision History

## 2016-02-23 NOTE — Progress Notes (Signed)
ANTICOAGULATION CONSULT NOTE - Follow Up Consult  Pharmacy Consult for Coumadin Indication: VTE prophylaxis  Allergies  Allergen Reactions  . No Known Allergies     Patient Measurements: Height: 5\' 6"  (167.6 cm) Weight: 250 lb (113.4 kg) IBW/kg (Calculated) : 63.8  Vital Signs: Temp: 98.5 F (36.9 C) (10/21 1218) Temp Source: Oral (10/21 1218) BP: 140/85 (10/21 1218) Pulse Rate: 88 (10/21 1218)  Labs:  Recent Labs  02/21/16 0331 02/22/16 0640 02/23/16 0354  LABPROT 15.3* 21.9* 22.5*  INR 1.20 1.89 1.95  CREATININE 0.75 0.75  --     Estimated Creatinine Clearance: 121.9 mL/min (by C-G formula based on SCr of 0.75 mg/dL).   Medications:  Scheduled:  . Brexpiprazole  1 tablet Oral Daily  . coumadin book   Does not apply Once  . desvenlafaxine  100 mg Oral q morning - 10a  . docusate sodium  100 mg Oral BID  . enoxaparin (LOVENOX) injection  40 mg Subcutaneous Q24H  . lamoTRIgine  200 mg Oral q morning - 10a  . lisinopril  40 mg Oral Daily  . methocarbamol  1,000 mg Oral Q6H  . metoprolol succinate  25 mg Oral Daily  . rosuvastatin  20 mg Oral QHS  . tamsulosin  0.4 mg Oral Daily  . Warfarin - Pharmacist Dosing Inpatient   Does not apply q1800    Assessment: Adrian YOM s/p ORIF on 10/17 and started on warfarin for anticoagulation post-op. Baseline INR 1.03.   INR approaching goal. CBC previously wnl and stable - no overt s/sx of bleeding noted, no CBC today. Continues on lovenox for VTE prophylaxis until INR>/=2.  Pt did not receive dose 10/20 as dose not entered & Rx not called per reminder order for such a situation.  Goal of Therapy:  INR 2-3 Monitor platelets by anticoagulation protocol: Yes   Plan:  Coumadin 10mg  today- bump up dose for missed dose 10/20 Daily INR D/C Lovenox when INR >= 2    Marisue HumbleKendra Dois Juarbe, PharmD Clinical Pharmacist [facility]

## 2016-02-23 NOTE — H&P (View-Only) (Signed)
Physical Medicine and Rehabilitation Admission H&P    Chief Complaint  Patient presents with  . Knee Pain  : HPI: Adrian Rivas is a 55 y.o. right handed male with history of COPD, chronic neck pain with history of anterior fusion as well as multiple low back surgeries. Per chart review patient lives with spouse independent prior to admission. Patient is on disability from multiple back surgeries. Wife works during the day. One level home with 3 steps to entry. Presented to [facility] 02/15/2016 after a fall while going to the bathroom. Denied loss of consciousness. X-rays and imaging revealed left tibial plateau fracture. He was transferred to [facility] and underwent ORIF with anterior compartment fasciotomy 02/19/2016 per Dr. Marcelino Scot. Nonweightbearing left lower extremity with hinged knee brace. Hospital course pain management. Subcutaneous Lovenox for DVT prophylaxis and plan to transition to Coumadin.Physical and occupational therapy evaluations completed with recommendations of physical medicine rehabilitation consult. Patient was admitted for comprehensive rehabilitation program  ROS Constitutional: Negative for chills and fever.  HENT: Negative for hearing loss.   Eyes: Negative for blurred vision.  Respiratory: Negative for cough and shortness of breath.   Cardiovascular: Positive for leg swelling. Negative for chest pain and palpitations.  Gastrointestinal: Positive for constipation. Negative for nausea and vomiting.  Genitourinary: Negative for dysuria and hematuria.  Musculoskeletal: Positive for back pain.  Skin: Negative for rash.  Psychiatric/Behavioral: Positive for depression.       Anxiety  All other systems reviewed and are negative   Past Medical History:  Diagnosis Date  . Anxiety   . Arthritis   . COPD (chronic obstructive pulmonary disease) (HCC)    TOLD HE HAS COPD - QUIT SMOKING 15 YRS AGO-- NO C/O OF SOB  . Depression   . Dyslipidemia     . HTN (hypertension)   . Pain    CHRONIC NECK PAIN- HX OF ANTERIOR FUSION  . Pain    LOWER BACK WITH PAIN DOWN BOTH LEGS - BUT WORSE LEFT LEG WITH NUMBNESS OUTER SIDE OF LEFT LEG - HAS SPINAL STENOSIS   Past Surgical History:  Procedure Laterality Date  . "SMALL BODIES REMOVED" FROM BOTH SHOULDERS    . BACK SURGERY     LUMBAR LAMINECTOMY  . BILATERAL CARPAL TUNNEL RELEASE    . CERVICAL FUSION    . FASCIOTOMY Left 02/19/2016   Procedure: ANTERIOR COMPARTMENT FASCIOTOMY;  Surgeon: Altamese Spruce Pine, MD;  Location: [place];  Service: Orthopedics;  Laterality: Left;  . LUMBAR LAMINECTOMY/DECOMPRESSION MICRODISCECTOMY N/A 10/20/2012   Procedure: DECOMPRESSION L2-L3, L1-L2;  Surgeon: Johnn Hai, MD;  Location: [facility];  Service: Orthopedics;  Laterality: N/A;  . ORIF TIBIA PLATEAU Left 02/19/2016  . ORIF TIBIA PLATEAU Left 02/19/2016   Procedure: OPEN REDUCTION INTERNAL FIXATION (ORIF) TIBIAL PLATEAU;  Surgeon: Altamese Madaket, MD;  Location: [place];  Service: Orthopedics;  Laterality: Left;  . RIGHT ACL RECONSTRUCTION     History reviewed. No pertinent family history. Social History:  reports that he has quit smoking. His smoking use included Cigarettes. He has a 20.00 pack-year smoking history. He has never used smokeless tobacco. He reports that he does not drink alcohol or use drugs. Allergies:  Allergies  Allergen Reactions  . No Known Allergies    Medications Prior to Admission  Medication Sig Dispense Refill  . cholecalciferol (VITAMIN D) 400 UNITS TABS Take 400 Units by mouth daily.    Marland Kitchen desvenlafaxine (PRISTIQ) 100 MG 24 hr tablet Take 100  mg by mouth every morning.    Marland Kitchen HYDROcodone-acetaminophen (NORCO) 10-325 MG tablet 1 tablet 3 (three) times daily as needed.  0  . lamoTRIgine (LAMICTAL) 100 MG tablet Take 200 mg by mouth every morning. TAKES 200 mg in the morning and 165m in the afternoon    . lisinopril (PRINIVIL,ZESTRIL) 40 MG tablet 1 tablet daily.  4  . methocarbamol  (ROBAXIN) 500 MG tablet Take 1 tablet (500 mg total) by mouth 4 (four) times daily. (Patient taking differently: Take 500 mg by mouth 3 (three) times daily. ) 45 tablet 1  . metoprolol succinate (TOPROL-XL) 25 MG 24 hr tablet 1 tablet daily.  4  . mometasone (NASONEX) 50 MCG/ACT nasal spray Place 2 sprays into the nose daily as needed.     . Multiple Vitamin (MULTIVITAMIN WITH MINERALS) TABS Take 1 tablet by mouth daily.    .Marland KitchenREXULTI 1 MG TABS 1 tablet daily.  5  . rosuvastatin (CRESTOR) 20 MG tablet Take 20 mg by mouth at bedtime.    . tamsulosin (FLOMAX) 0.4 MG CAPS capsule 1 capsule daily.  0  . zolpidem (AMBIEN) 10 MG tablet Take 10 mg by mouth at bedtime as needed for sleep.      Home: Home Living Family/patient expects to be discharged to:: Inpatient rehab Living Arrangements: Spouse/significant other Available Help at Discharge: Family, Available PRN/intermittently Type of Home: House Home Access: Stairs to enter [employer] Steps: 3 Home Layout: One level Home Equipment: WEnvironmental consultant- 2 wheels, Walker - 4 wheels, Bedside commode   Functional History: Prior Function Level of Independence: Independent  Functional Status:  Mobility: Bed Mobility Overal bed mobility: Needs Assistance Bed Mobility: Rolling, Sidelying to Sit Rolling: Mod assist, +2 for physical assistance Sidelying to sit: Mod assist, +2 for physical assistance Supine to sit: Min assist General bed mobility comments: Mod assist +2 for rolling and advance B LEs to edge of bed during sidelying to sit.   Transfers Overall transfer level: Needs assistance Equipment used: Rolling walker (2 wheeled) Transfers: Sit to/from Stand Sit to Stand: Min assist, +2 physical assistance General transfer comment: Cues for hand placement and forward weight shifting.  Pt required cues for sequencing and PTA supported L foot to maintain NWB.  Pt remains to require constant VCs to keep LLE off the floor.     Ambulation/Gait Ambulation/Gait assistance: Min assist, +2 physical assistance Ambulation Distance (Feet): 4 Feet Assistive device: Rolling walker (2 wheeled) Gait Pattern/deviations: Step-to pattern, Antalgic, Decreased stride length General Gait Details: Cues for sequencing and RW advancement.   Gait velocity interpretation: Below normal speed for age/gender    ADL: ADL Overall ADL's : Needs assistance/impaired Eating/Feeding: Set up, Sitting Grooming: Wash/dry hands, Wash/dry face, Set up, Sitting Grooming Details (indicate cue type and reason): unable to sustain standing for ADL at this time Upper Body Bathing: Set up, Sitting Lower Body Bathing: Total assistance, Bed level Lower Body Bathing Details (indicate cue type and reason): Pt would benefit from education in AE  Upper Body Dressing : Set up, Sitting Lower Body Dressing: Total assistance, Cueing for sequencing, Sit to/from stand Lower Body Dressing Details (indicate cue type and reason): Pt unable to reach down to feet for LB dressing, Pt will benefit from AE education Toilet Transfer: Moderate assistance, +2 for physical assistance, Cueing for safety, Stand-pivot, RW Toilet Transfer Details (indicate cue type and reason): Pt unable to maintain NWB despite vc from therapists; simulated with recliner Toileting- Clothing Manipulation and Hygiene: Total assistance,  Sit to/from stand Tub/Shower Transfer Details (indicate cue type and reason): Pt unable to maintain NWB during stand pivot, ambulation not attempted at this time. Functional mobility during ADLs: +2 for physical assistance, Moderate assistance, Cueing for sequencing, Rolling walker, Cueing for safety General ADL Comments: Pt willing to work with therapy despite high pain score. Pt motivated to increase independence. Great CIR candidate  Cognition: Cognition Overall Cognitive Status: Within Functional Limits for tasks assessed Orientation Level: Oriented  X4 Cognition Arousal/Alertness: Awake/alert Behavior During Therapy: WFL for tasks assessed/performed Overall Cognitive Status: Within Functional Limits for tasks assessed  Physical Exam: Blood pressure (!) 159/82, pulse 89, temperature 98.5 F (36.9 C), temperature source Oral, resp. rate 17, height _0  (1.676 m), weight 113.4 kg (250 lb), SpO2 95 %. Physical Exam Constitutional: He is oriented to person, place, and time. Well-developed. Obese. NAD. HENT:  Head: Normocephalic. Atraumatic. Eyes: EOM are normal. No discharge.  Neck: Normal range of motion. Neck supple. No thyromegaly present.  Cardiovascular: Normal rate and regular rhythm.   Respiratory: +. Effort normal and breath sounds normal. No respiratory distress.  GI: Soft. Bowel sounds are normal. He exhibits no distension.  Musc: Hinged knee brace LLE Neurological: He is alert and oriented to person, place, and time.  Motor: B/l UE, RLE: 5/5 LLE: Hip flexion 3/5, ankle dorsi/plantar flexion 4/5 Skin: Left hip incision clean and dry    Results for orders placed or performed during the hospital encounter of 02/15/16 (from the past 48 hour(s))  CBC     Status: Abnormal   Collection Time: 02/20/16  9:19 AM  Result Value Ref Range   WBC 9.1 4.0 - 10.5 K/uL   RBC 3.85 (L) 4.22 - 5.81 MIL/uL   Hemoglobin 13.4 13.0 - 17.0 g/dL   HCT 39.7 39.0 - 52.0 %   MCV 103.1 (H) 78.0 - 100.0 fL   MCH 34.8 (H) 26.0 - 34.0 pg   MCHC 33.8 30.0 - 36.0 g/dL   RDW 13.0 11.5 - 15.5 %   Platelets 203 150 - 400 K/uL  Basic metabolic panel     Status: Abnormal   Collection Time: 02/20/16  9:19 AM  Result Value Ref Range   Sodium 141 135 - 145 mmol/L   Potassium 3.5 3.5 - 5.1 mmol/L   Chloride 107 101 - 111 mmol/L   CO2 24 22 - 32 mmol/L   Glucose, Bld 103 (H) 65 - 99 mg/dL   BUN 5 (L) 6 - 20 mg/dL   Creatinine, Ser 0.80 0.61 - 1.24 mg/dL   Calcium 8.9 8.9 - 10.3 mg/dL   GFR calc non Af Amer >60 >60 mL/min   GFR calc Af Amer >60 >60  mL/min    Comment: (NOTE) The eGFR has been calculated using the CKD EPI equation. This calculation has not been validated in all clinical situations. eGFR's persistently <60 mL/min signify possible Chronic Kidney Disease.    Anion gap 10 5 - 15  Protime-INR     Status: None   Collection Time: 02/20/16  9:19 AM  Result Value Ref Range   Prothrombin Time 14.6 11.4 - 15.2 seconds   INR 1.03   Basic metabolic panel     Status: Abnormal   Collection Time: 02/21/16  3:31 AM  Result Value Ref Range   Sodium 141 135 - 145 mmol/L   Potassium 3.1 (L) 3.5 - 5.1 mmol/L   Chloride 106 101 - 111 mmol/L   CO2 25 22 - 32 mmol/L  Glucose, Bld 107 (H) 65 - 99 mg/dL   BUN 8 6 - 20 mg/dL   Creatinine, Ser 0.75 0.61 - 1.24 mg/dL   Calcium 8.9 8.9 - 10.3 mg/dL   GFR calc non Af Amer >60 >60 mL/min   GFR calc Af Amer >60 >60 mL/min    Comment: (NOTE) The eGFR has been calculated using the CKD EPI equation. This calculation has not been validated in all clinical situations. eGFR's persistently <60 mL/min signify possible Chronic Kidney Disease.    Anion gap 10 5 - 15  Protime-INR     Status: Abnormal   Collection Time: 02/21/16  3:31 AM  Result Value Ref Range   Prothrombin Time 15.3 (H) 11.4 - 15.2 seconds   INR 1.20    No results found.   Medical Problem List and Plan: 1.  Decreased functional mobility secondary to left tibial plateau fracture status post ORIF 02/19/2016. Non-weightbearing 2.  DVT Prophylaxis/Anticoagulation: Coumadin per pharmacy protocol. Monitor for any bleeding episodes. Check vascular study 3. Pain Management: Oxycodone and Robaxin as needed. Monitor with increased mobility 4. Mood. Lamictal 200 mg daily, Pristiq 100 mg daily,Brexipiprazole 1 mg daily. 5. Neuropsych: This patient is capable of making decisions on his own behalf. 6. Skin/Wound Care: Routine skin checks 7. Fluids/Electrolytes/Nutrition: Routine I&O with follow-up chemistries 8. Hypertension.  Lisinopril 40 mg daily, Toprol 25 mg daily. Monitor with increased mobility 9. BPH. Flomax 0.4 mg daily. Check PVRs 3 10. Hyperlipidemia. Crestor 11. Hypokalemia: Follow BMP.  Post Admission Physician Evaluation: 1. Functional deficits secondary  to left tibial plateau fracture status post ORIF. 2. Patient is admitted to receive collaborative, interdisciplinary care between the physiatrist, rehab nursing staff, and therapy team. 3. Patient's level of medical complexity and substantial therapy needs in context of that medical necessity cannot be provided at a lesser intensity of care such as a SNF. 4. Patient has experienced substantial functional loss from his/her baseline which was documented above under the "Functional History" and "Functional Status" headings.  Judging by the patient's diagnosis, physical exam, and functional history, the patient has potential for functional progress which will result in measurable gains while on inpatient rehab.  These gains will be of substantial and practical use upon discharge  in facilitating mobility and self-care at the household level. 5. Physiatrist will provide 24 hour management of medical needs as well as oversight of the therapy plan/treatment and provide guidance as appropriate regarding the interaction of the two. 6. 24 hour rehab nursing will assist with bowel management, safety, skin/wound care, disease management, medication administration, pain management and patient education and help integrate therapy concepts, techniques,education, etc. 7. PT will assess and treat for/with: Lower extremity strength, range of motion, stamina, balance, functional mobility, safety, adaptive techniques and equipment, woundcare, coping skills, pain control, education.   Goals are: Mod I. 8. OT will assess and treat for/with: ADL's, functional mobility, safety, upper extremity strength, adaptive techniques and equipment, wound mgt, ego support, and community  reintegration.   Goals are: Mod I. Therapy may not proceed with showering this patient. 9. Case Management and Social Worker will assess and treat for psychological issues and discharge planning. 10. Team conference will be held weekly to assess progress toward goals and to determine barriers to discharge. 11. Patient will receive at least 3 hours of therapy per day at least 5 days per week. 12. ELOS: 7-10 days. 13. Prognosis:  excellent  Delice Lesch, MD, Mellody Drown 02/22/2016

## 2016-02-24 ENCOUNTER — Inpatient Hospital Stay (HOSPITAL_COMMUNITY): Payer: BLUE CROSS/BLUE SHIELD

## 2016-02-24 ENCOUNTER — Inpatient Hospital Stay (HOSPITAL_COMMUNITY): Payer: BLUE CROSS/BLUE SHIELD | Admitting: Physical Therapy

## 2016-02-24 DIAGNOSIS — S82142A Displaced bicondylar fracture of left tibia, initial encounter for closed fracture: Secondary | ICD-10-CM

## 2016-02-24 DIAGNOSIS — F5101 Primary insomnia: Secondary | ICD-10-CM

## 2016-02-24 DIAGNOSIS — E876 Hypokalemia: Secondary | ICD-10-CM

## 2016-02-24 DIAGNOSIS — I10 Essential (primary) hypertension: Secondary | ICD-10-CM

## 2016-02-24 DIAGNOSIS — N400 Enlarged prostate without lower urinary tract symptoms: Secondary | ICD-10-CM

## 2016-02-24 DIAGNOSIS — R791 Abnormal coagulation profile: Secondary | ICD-10-CM

## 2016-02-24 DIAGNOSIS — M79609 Pain in unspecified limb: Secondary | ICD-10-CM

## 2016-02-24 DIAGNOSIS — S82142S Displaced bicondylar fracture of left tibia, sequela: Secondary | ICD-10-CM

## 2016-02-24 LAB — PROTIME-INR
INR: 2.16
Prothrombin Time: 24.4 seconds — ABNORMAL HIGH (ref 11.4–15.2)

## 2016-02-24 MED ORDER — ZOLPIDEM TARTRATE 5 MG PO TABS
10.0000 mg | ORAL_TABLET | Freq: Every day | ORAL | Status: DC
Start: 2016-02-24 — End: 2016-02-24

## 2016-02-24 MED ORDER — ZOLPIDEM TARTRATE 5 MG PO TABS
10.0000 mg | ORAL_TABLET | Freq: Every evening | ORAL | Status: DC | PRN
  Administered 2016-02-24 – 2016-02-28 (×5): 10 mg via ORAL
  Filled 2016-02-24 (×5): qty 2

## 2016-02-24 MED ORDER — WARFARIN SODIUM 5 MG PO TABS
5.0000 mg | ORAL_TABLET | Freq: Once | ORAL | Status: AC
Start: 2016-02-24 — End: 2016-02-24
  Administered 2016-02-24: 5 mg via ORAL
  Filled 2016-02-24: qty 1

## 2016-02-24 NOTE — Progress Notes (Signed)
[place] PHYSICAL MEDICINE & REHABILITATION     PROGRESS NOTE  Subjective/Complaints:  Pt seen laying in bed this AM.  He states he did not sleep well because he did not receive his home ambien.    ROS: Denies CP, SOB, N/V/D.  Objective: Vital Signs: Blood pressure (!) 150/80, pulse 90, temperature 99.3 F (37.4 C), temperature source Oral, resp. rate 19, height 5\' 6"  (1.676 m), weight 112.5 kg (248 lb 1.6 oz), SpO2 91 %. No results found. No results for input(s): WBC, HGB, HCT, PLT in the last 72 hours.  Recent Labs  02/22/16 0640  NA 141  K 3.1*  CL 105  GLUCOSE 118*  BUN 8  CREATININE 0.75  CALCIUM 8.8*   CBG (last 3)  No results for input(s): GLUCAP in the last 72 hours.  Wt Readings from Last 3 Encounters:  02/23/16 112.5 kg (248 lb 1.6 oz)  02/15/16 113.4 kg (250 lb)  10/20/12 105.2 kg (232 lb)    Physical Exam:  BP (!) 150/80 (BP Location: Right Arm)   Pulse 90   Temp 99.3 F (37.4 C) (Oral)   Resp 19   Ht 5\' 6"  (1.676 m)   Wt 112.5 kg (248 lb 1.6 oz)   SpO2 91%   BMI 40.04 kg/m  Constitutional: Well-developed. Obese. NAD. HENT: Normocephalic. Atraumatic. Eyes: EOMare normal. No discharge.  Cardiovascular: Normal rateand regular rhythm.  Respiratory: Effort normaland breath sounds normal. No respiratory distress.  GI: Soft. Bowel sounds are normal. He exhibits no distension.  Musc: Hinged knee brace LLE Neurological: He is alertand oriented.  Motor: B/l UE, RLE: 5/5 LLE: Hip flexion 3/5, ankle dorsi/plantar flexion 4/5 Skin: Incision clean and dry.   Assessment/Plan: 1. Functional deficits secondary to left tibial plateau fracture status post ORIF which require 3+ hours per day of interdisciplinary therapy in a comprehensive inpatient rehab setting. Physiatrist is providing close team supervision and 24 hour management of active medical problems listed below. Physiatrist and rehab team continue to assess barriers to discharge/monitor patient  progress toward functional and medical goals.  Function:  Bathing Bathing position      Bathing parts      Bathing assist        Upper Body Dressing/Undressing Upper body dressing                    Upper body assist        Lower Body Dressing/Undressing Lower body dressing                                  Lower body assist        Toileting Toileting          Toileting assist     Transfers Chair/bed transfer             Locomotion Ambulation           Wheelchair          Cognition Comprehension Comprehension assist level: Follows basic conversation/direction with no assist  Expression Expression assist level: Expresses basic needs/ideas: With no assist  Social Interaction Social Interaction assist level: Interacts appropriately with others - No medications needed.  Problem Solving Problem solving assist level: Solves basic problems with no assist  Memory Memory assist level: Recognizes or recalls 90% of the time/requires cueing < 10% of the time    Medical Problem List and Plan: 1.  Decreased  functional mobility secondary to left tibial plateau fracture status post ORIF 02/19/2016.   Non-weightbearing  Begin CIR 2.  DVT Prophylaxis/Anticoagulation:   Coumadin per pharmacy protocol, INR subtherapeutic  Monitor for any bleeding episodes.   Vascular study pending 3. Pain Management: Oxycodone and Robaxin as needed. Monitor with increased mobility 4. Mood. Lamictal 200 mg daily, Pristiq 100 mg daily,Brexipiprazole 1 mg daily. 5. Neuropsych: This patient is capable of making decisions on his own behalf. 6. Skin/Wound Care: Routine skin checks 7. Fluids/Electrolytes/Nutrition: Routine I&Os 8. Hypertension.   Lisinopril 40 mg daily  Toprol 25 mg daily.    Monitor with increased mobility  Slightly elevated this AM, however, overall controlled, will cont to monitor 9. BPH. Flomax 0.4 mg daily. Check PVRs 3 10. Hyperlipidemia.  Crestor 11. Hypokalemia:   K 3.1 on 10/20  Labs ordered for tomorrow. 12. Insomnia  Home Ambien 10mg  started on 10/22  LOS (Days) 1 A FACE TO FACE EVALUATION WAS PERFORMED  Japleen Tornow Karis Jubanil Drisana Schweickert 02/24/2016 8:15 AM

## 2016-02-24 NOTE — IPOC Note (Addendum)
Overall Plan of Care [facility]) Patient Details Name: Adrian Rivas MRN: [id number] DOB: November 01, 1960  Admitting Diagnosis: TIB [facility] Problems: Active Problems:   Hip fracture (HCC)   Closed fracture of left tibial plateau   Subtherapeutic international normalized ratio (INR)   Benign essential HTN   Benign prostatic hyperplasia   Primary insomnia     Functional Problem List: Nursing Pain, Edema, Endurance, Motor, Medication Management, Safety, Skin Integrity  PT Balance, Edema, Endurance, Motor, Pain, Nutrition, Safety  OT Balance, Endurance, Pain, Safety  SLP    TR         Basic ADL's: OT Bathing, Dressing, Toileting     Advanced  ADL's: OT Simple Meal Preparation     Transfers: PT Bed Mobility, Bed to Chair, Car, Occupational psychologist, Research scientist (life sciences): PT Ambulation, Psychologist, prison and probation services, Stairs     Additional Impairments: OT None  SLP        TR      Anticipated Outcomes Item Anticipated Outcome  Self Feeding Mod I  Swallowing      Basic self-care  Min A  Toileting  Mod I   Bathroom Transfers Supervision  Bowel/Bladder  Pt will remain continent of bowel and bladder with min assist   Transfers  mod I  Locomotion  mod I household ambulator  Communication     Cognition     Pain  Pt will manage pain at 4 or less on a scale of 0-10.   Safety/Judgment  Pt will remain free of falls and injury with min assist    Therapy Plan: PT Intensity: Minimum of 1-2 x/day ,45 to 90 minutes PT Frequency: 5 out of 7 days PT Duration Estimated Length of Stay: 5-7 days OT Intensity: Minimum of 1-2 x/day, 45 to 90 minutes OT Frequency: 5 out of 7 days OT Duration/Estimated Length of Stay: 5-7 days         Team Interventions: Nursing Interventions Patient/Family Education, Skin Care/Wound Management, Disease Management/Prevention, Pain Management, Medication Management, Discharge Planning  PT interventions Community reintegration,  Ambulation/gait training, Warden/ranger, Discharge planning, DME/adaptive equipment instruction, Functional mobility training, Neuromuscular re-education, Pain management, Patient/family education, Psychosocial support, Stair training, Therapeutic Activities, Therapeutic Exercise, UE/LE Strength taining/ROM, UE/LE Coordination activities, Wheelchair propulsion/positioning  OT Interventions Warden/ranger, Patient/family education, Therapeutic Exercise, UE/LE Coordination activities, Therapeutic Activities, Pain management, Functional mobility training, DME/adaptive equipment instruction, Discharge planning  SLP Interventions    TR Interventions    SW/CM Interventions  Pt/Family Educaiton, Psychosocial Assessment, Discharge Planning    Team Discharge Planning: Destination: PT-Home ,OT- Home , SLP-  Projected Follow-up: PT-Home health PT, OT-  None, SLP-  Projected Equipment Needs: PT-To be determined, OT- To be determined, SLP-  Equipment Details: PT-pt owns RW, rollator, and reports that hospital already ordered him a WC, OT-  Patient/family involved in discharge planning: PT- Patient,  OT-Patient, SLP-   MD ELOS: 5-7 days. Medical Rehab Prognosis:  Excellent Assessment:  55 y.o.right handed malewith history of COPD, chronic neck pain with history of anterior fusion as well as multiple low back surgeries. Patient lives with spouse independent prior to admission.Patient is on disability from multiple back surgeries. Wife works during the Presented to [employer] 02/15/2016 after a fall while going to the bathroom. Denied loss of consciousness. X-rays and imaging revealed left tibial plateau fracture. He was transferred to [facility] and underwent ORIF with anterior compartment fasciotomy 02/19/2016 per Dr. Carola Frost. Nonweightbearing left lower extremity with  hinged knee brace. Hospital course pain management.  Pt with resulting functional deficits with  mobility, safety, and transfers. Will set goals for Mod I for most tasks with therapies.     See Team Conference Notes for weekly updates to the plan of care

## 2016-02-24 NOTE — Plan of Care (Signed)
Problem: RH PAIN MANAGEMENT Goal: RH STG PAIN MANAGED AT OR BELOW PT'S PAIN GOAL <4   Outcome: Not Progressing Patient rates pain as 8 or greater on 1-10 scale consistently.

## 2016-02-24 NOTE — Progress Notes (Signed)
ANTICOAGULATION CONSULT NOTE - Follow Up Consult  Pharmacy Consult for Coumadin Indication: VTE prophylaxis  Allergies  Allergen Reactions  . No Known Allergies     Patient Measurements: Height: 5\' 6"  (167.6 cm) Weight: 248 lb 1.6 oz (112.5 kg) IBW/kg (Calculated) : 63.8  Vital Signs: Temp: 99.3 F (37.4 C) (10/22 0543) Temp Source: Oral (10/22 0543) BP: 150/80 (10/22 0543) Pulse Rate: 90 (10/22 0543)  Labs:  Recent Labs  02/22/16 0640 02/23/16 0354 02/24/16 0431  LABPROT 21.9* 22.5* 24.4*  INR 1.89 1.95 2.16  CREATININE 0.75  --   --     Estimated Creatinine Clearance: 121.5 mL/min (by C-G formula based on SCr of 0.75 mg/dL).   Medications:  Scheduled:  . Brexpiprazole  1 tablet Oral Daily  . desvenlafaxine  100 mg Oral q morning - 10a  . enoxaparin (LOVENOX) injection  40 mg Subcutaneous Q24H  . lamoTRIgine  200 mg Oral q morning - 10a  . lisinopril  40 mg Oral Daily  . metoprolol succinate  25 mg Oral Daily  . rosuvastatin  20 mg Oral QHS  . tamsulosin  0.4 mg Oral Daily  . Warfarin - Pharmacist Dosing Inpatient   Does not apply q1800    Assessment: 89 YOM s/p ORIF on 10/17 and started on warfarin for anticoagulation post-op. Baseline INR 1.03.   INR therapeutic today at 2.16 after boost doses. Patient did not receive dose 10/20. CBC previously wnl and stable - no overt s/sx of bleeding noted, no CBC today.   Given therapeutic level, will stop enoxaparin and continue warfarin.  Goal of Therapy:  INR 2-3 Monitor platelets by anticoagulation protocol: Yes   Plan:  Warfarin 5mg  PO x1 tonight  Stop enoxaparin Daily INR Monitor for signs/symptoms of bleeding  Carylon PerchesMaggie Shuda, PharmD Acute Care Pharmacy Resident  Pager: [phone] 02/24/2016

## 2016-02-24 NOTE — Evaluation (Signed)
Physical Therapy Assessment and Plan  Patient Details  Name: Adrian Rivas MRN: [id number] Date of Birth: 1961/01/21  PT Diagnosis: Difficulty walking, Edema, Muscle weakness and Pain in left lower extremity Rehab Potential: Good ELOS: 5-7 days   Today's Date: 02/24/2016 PT Individual Time: 1300-1400 PT Individual Time Calculation (min): 60 min     Problem List: Patient Active Problem List   Diagnosis Date Noted  . Closed fracture of left tibial plateau   . Subtherapeutic international normalized ratio (INR)   . Benign essential HTN   . Benign prostatic hyperplasia   . Primary insomnia   . Hip fracture ([place]) 02/23/2016  . Tibial plateau fracture, left   . Post-operative pain   . Hypokalemia   . Benign prostatic hyperplasia without lower urinary tract symptoms   . Hyperlipidemia   . Abnormality of gait   . COPD exacerbation ([place])   . Closed tibia fracture 02/15/2016  . Closed bicondylar fracture of left tibial plateau 02/15/2016  . HTN (hypertension)   . Spinal stenosis of lumbar region 10/20/2012  . PULMONARY NODULE 11/10/2007  . CHEST PAIN 11/10/2007    Past Medical History:  Past Medical History:  Diagnosis Date  . Anxiety   . Arthritis   . COPD (chronic obstructive pulmonary disease) (HCC)    TOLD HE HAS COPD - QUIT SMOKING 15 YRS AGO-- NO C/O OF SOB  . Depression   . Dyslipidemia   . HTN (hypertension)   . Pain    CHRONIC NECK PAIN- HX OF ANTERIOR FUSION  . Pain    LOWER BACK WITH PAIN DOWN BOTH LEGS - BUT WORSE LEFT LEG WITH NUMBNESS OUTER SIDE OF LEFT LEG - HAS SPINAL STENOSIS   Past Surgical History:  Past Surgical History:  Procedure Laterality Date  . "SMALL BODIES REMOVED" FROM BOTH SHOULDERS    . BACK SURGERY     LUMBAR LAMINECTOMY  . BILATERAL CARPAL TUNNEL RELEASE    . CERVICAL FUSION    . FASCIOTOMY Left 02/19/2016   Procedure: ANTERIOR COMPARTMENT FASCIOTOMY;  Surgeon: Altamese Chatsworth, MD;  Location: [place];  Service: Orthopedics;   Laterality: Left;  . LUMBAR LAMINECTOMY/DECOMPRESSION MICRODISCECTOMY N/A 10/20/2012   Procedure: DECOMPRESSION L2-L3, L1-L2;  Surgeon: Johnn Hai, MD;  Location: [facility];  Service: Orthopedics;  Laterality: N/A;  . ORIF TIBIA PLATEAU Left 02/19/2016  . ORIF TIBIA PLATEAU Left 02/19/2016   Procedure: OPEN REDUCTION INTERNAL FIXATION (ORIF) TIBIAL PLATEAU;  Surgeon: Altamese Ballinger, MD;  Location: [place];  Service: Orthopedics;  Laterality: Left;  . RIGHT ACL RECONSTRUCTION      Assessment & Plan Clinical Impression: Adrian Rivas a 55 y.o.right handed malewith history of COPD, chronic neck pain with history of anterior fusion as well as multiple low back surgeries. Per chart review patient lives with spouse independent prior to admission.Patient is on disability from multiple back surgeries. Wife works during the day.One level home with 3 steps to entry. [facility]ed to [facility]/15/2016 after a fall while going to the bathroom. Denied loss of consciousness. X-rays and imaging revealed left tibial plateau fracture. He was transferred to [facility] and underwent ORIF with anterior compartment fasciotomy 02/19/2016 per Dr. Marcelino Scot. Nonweightbearing left lower extremity with hinged knee brace. Hospital course pain management. Subcutaneous Lovenox for DVT prophylaxis and plan to transition to Coumadin. Patient transferred to CIR on 02/23/2016.   Patient currently requires min with mobility secondary to muscle weakness and muscle joint tightness, decreased cardiorespiratoy endurance and decreased  standing balance, decreased balance strategies and difficulty maintaining precautions.  Prior to hospitalization, patient was modified independent  with mobility and lived with Spouse, Son (and step-son, 57 y/o, unemployed) in a House home.  Home access is 3Stairs to enter.  Patient will benefit from skilled PT intervention to maximize safe functional mobility, minimize fall risk and  decrease caregiver burden for planned discharge home with intermittent assist.  Anticipate patient will benefit from follow up [facility] at discharge.  PT - End of Session Activity Tolerance: Tolerates 30+ min activity with multiple rests Endurance Deficit: Yes Endurance Deficit Description: requires rest breaks due to pain and SOB PT Assessment Rehab Potential (ACUTE/IP ONLY): Good Barriers to Discharge: Decreased caregiver support Barriers to Discharge Comments: wife works 8-5 PT Patient demonstrates impairments in the following area(s): Balance;Edema;Endurance;Motor;Pain;Nutrition;Safety PT Transfers Functional Problem(s): Bed Mobility;Bed to Chair;Car;Furniture PT Locomotion Functional Problem(s): Ambulation;Wheelchair Mobility;Stairs PT Plan PT Intensity: Minimum of 1-2 x/day ,45 to 90 minutes PT Frequency: 5 out of 7 days PT Duration Estimated Length of Stay: 5-7 days PT Treatment/Interventions: Community reintegration;Ambulation/gait training;Balance/vestibular training;Discharge planning;DME/adaptive equipment instruction;Functional mobility training;Neuromuscular re-education;Pain management;Patient/family education;Psychosocial support;Stair training;Therapeutic Activities;Therapeutic Exercise;UE/LE Strength taining/ROM;UE/LE Coordination activities;Wheelchair propulsion/positioning PT Transfers Anticipated Outcome(s): mod I PT Locomotion Anticipated Outcome(s): mod I household ambulator PT Recommendation Follow Up Recommendations: Home health PT Patient destination: Home Equipment Recommended: To be determined Equipment Details: pt owns RW, rollator, and reports that hospital already ordered him a [facility]  Skilled Therapeutic Intervention Skilled therapeutic intervention initiated after completion of evaluation. Discussed with patient falls risk, safety within room, and focus of therapy during stay. Discussed possible length of stay, goals, and follow-up therapy. Patient currently requires  supervision-min A up to 15 ft using RW. Patient provided education regarding temporary ramps as he does not want to have permanent ramp built and need for home measurements, verbalized understanding. Patient left supine in bed with all needs within reach.    PT Evaluation Precautions/Restrictions Precautions Precautions: Fall;Knee Precaution Booklet Issued: No Required Braces or Orthoses: Other Brace/Splint Other Brace/Splint: hinged knee brace LLE Restrictions Weight Bearing Restrictions: Yes LLE Weight Bearing: Non weight bearing General Chart Reviewed: Yes Family/Caregiver Present: No  Pain Pain Assessment Pain Assessment: 0-10 Pain Score: 8  Pain Type: Acute pain Pain Location: Knee Pain Orientation: Left Pain Descriptors / Indicators: Aching Pain Onset: On-going Patients Stated Pain Goal: 3 Pain Intervention(s): Repositioned;Rest Home Living/Prior Functioning Home Living Available Help at Discharge: Family;Available PRN/intermittently Type of Home: House Home Access: Stairs to enter [employer] of Steps: 3 Entrance Stairs-Rails: Right;Left (cannot reach both at the same time.) Home Layout: One level Bathroom Shower/Tub: Multimedia programmer: Standard (with safety frame attached) Bathroom Accessibility: Yes Additional Comments: Owns RW and rollator, shower has built-in seat, no grab bars, fiberglass enclosure.  Lives With: Spouse;Son (and step-son, 48 y/o, unemployed) Prior Function Level of Independence: Independent with basic ADLs;Independent with gait;Independent with transfers  Able to Take Stairs?: Yes Driving: Yes Vocation: On disability Leisure: Hobbies-yes (Comment) Comments: "homebody", coin collection Vision/Perception   No change from baseline  Cognition Overall Cognitive Status: Within Functional Limits for tasks assessed Arousal/Alertness: Awake/alert Attention: Alternating Memory: Appears intact Awareness: Appears  intact Problem Solving: Appears intact Safety/Judgment: Appears intact Sensation Sensation Light Touch: Appears Intact Stereognosis: Appears Intact Hot/Cold: Appears Intact Proprioception: Appears Intact Coordination Gross Motor Movements are Fluid and Coordinated: No Fine Motor Movements are Fluid and Coordinated: Yes Coordination and Movement Description: limited by post op pain and weakness Motor  Motor Motor: Within Functional  Limits  Mobility Bed Mobility Bed Mobility: Supine to Sit;Sit to Supine Supine to Sit: 5: Supervision Sit to Supine: 5: Supervision Transfers Transfers: Yes Sit to Stand: With upper extremity assist;4: Min guard Stand to Sit: 4: Min guard;With upper extremity assist Locomotion  Ambulation Ambulation: Yes Ambulation/Gait Assistance: 5: Supervision Ambulation Distance (Feet): 15 Feet Assistive device: Rolling walker Gait Gait: Yes Gait Pattern: Impaired Gait Pattern: Trunk flexed (hop-to) Gait velocity: decreased Wheelchair Mobility Wheelchair Mobility: Yes Wheelchair Assistance: 5: Careers information officer: Both upper extremities Wheelchair Parts Management: Needs assistance Distance: 150 ft  Trunk/Postural Assessment  Cervical Assessment Cervical Assessment: Within Functional Limits Thoracic Assessment Thoracic Assessment: Within Functional Limits Lumbar Assessment Lumbar Assessment: Within Functional Limits Postural Control Postural Control: Deficits on evaluation Protective Responses: limited by LLE NWB   Balance Balance Balance Assessed: Yes Static Standing Balance Static Standing - Balance Support: During functional activity;Bilateral upper extremity supported Static Standing - Level of Assistance: 5: Stand by assistance Extremity Assessment  RUE Assessment RUE Assessment: Within Functional Limits LUE Assessment LUE Assessment: Within Functional Limits RLE Assessment RLE Assessment: Within Functional Limits (5/5  throughout except 4-/5 ankle DF (reports hx of foot drop)) LLE Assessment LLE Assessment: Exceptions to [facility] LLE Strength LLE Overall Strength: Deficits;Due to pain;Due to precautions LLE Overall Strength Comments: hip/knee NT, ankle DF/PF [facility]   See Function Navigator for Current Functional Status.   Refer to Care Plan for Long Term Goals  Recommendations for other services: None  Discharge Criteria: Patient will be discharged from PT if patient refuses treatment 3 consecutive times without medical reason, if treatment goals not met, if there is a change in medical status, if patient makes no progress towards goals or if patient is discharged from hospital.  The above assessment, treatment plan, treatment alternatives and goals were discussed and mutually agreed upon: by patient  Laretta Alstrom 02/24/2016, 1:31 PM

## 2016-02-24 NOTE — Evaluation (Signed)
Occupational Therapy Assessment and Plan  Patient Details  Name: Adrian Rivas MRN: [id number] Date of Birth: [birth year]  OT Diagnosis: muscle weakness (generalized) Rehab Potential: Rehab Potential (ACUTE ONLY): Excellent ELOS: 5-7 days   Today's Date: 02/24/2016 OT Individual Time: 1000-1100 OT Individual Time Calculation (min): 60 min      Problem List:  Patient Active Problem List   Diagnosis Date Noted  . Closed fracture of left tibial plateau   . Subtherapeutic international normalized ratio (INR)   . Benign essential HTN   . Benign prostatic hyperplasia   . Primary insomnia   . Hip fracture ([place]) 02/23/2016  . Tibial plateau fracture, left   . Post-operative pain   . Hypokalemia   . Benign prostatic hyperplasia without lower urinary tract symptoms   . Hyperlipidemia   . Abnormality of gait   . COPD exacerbation ([place])   . Closed tibia fracture 02/15/2016  . Closed bicondylar fracture of left tibial plateau 02/15/2016  . HTN (hypertension)   . Spinal stenosis of lumbar region 10/20/2012  . PULMONARY NODULE 11/10/2007  . CHEST PAIN 11/10/2007    Past Medical History:  Past Medical History:  Diagnosis Date  . Anxiety   . Arthritis   . COPD (chronic obstructive pulmonary disease) (HCC)    TOLD HE HAS COPD - QUIT SMOKING 15 YRS AGO-- NO C/O OF SOB  . Depression   . Dyslipidemia   . HTN (hypertension)   . Pain    CHRONIC NECK PAIN- HX OF ANTERIOR FUSION  . Pain    LOWER BACK WITH PAIN DOWN BOTH LEGS - BUT WORSE LEFT LEG WITH NUMBNESS OUTER SIDE OF LEFT LEG - HAS SPINAL STENOSIS   Past Surgical History:  Past Surgical History:  Procedure Laterality Date  . "SMALL BODIES REMOVED" FROM BOTH SHOULDERS    . BACK SURGERY     LUMBAR LAMINECTOMY  . BILATERAL CARPAL TUNNEL RELEASE    . CERVICAL FUSION    . FASCIOTOMY Left 02/19/2016   Procedure: ANTERIOR COMPARTMENT FASCIOTOMY;  Surgeon: Altamese Ewing, MD;  Location: [place];  Service: Orthopedics;  Laterality:  Left;  . LUMBAR LAMINECTOMY/DECOMPRESSION MICRODISCECTOMY N/A 10/20/2012   Procedure: DECOMPRESSION L2-L3, L1-L2;  Surgeon: Johnn Hai, MD;  [facility];  Service: Orthopedics;  Laterality: N/A;  . ORIF TIBIA PLATEAU Left 02/19/2016  . ORIF TIBIA PLATEAU Left 02/19/2016   Procedure: OPEN REDUCTION INTERNAL FIXATION (ORIF) TIBIAL PLATEAU;  Surgeon: Altamese [place]dale, MD;  Location: [place];  Service: Orthopedics;  Laterality: Left;  . RIGHT ACL RECONSTRUCTION      Assessment & Plan Clinical Impression: Patient is a 55 y.o. right handed malewith history of COPD, chronic neck pain with history of anterior fusion as well as multiple low back surgeries. Per chart review patient lives with spouse independent prior to admission.Patient is on disability from multiple back surgeries. Wife works during the day.One level home with 3 steps to entry. Presented to [facility] 02/15/2016 after a fall while going to the bathroom. Denied loss of consciousness. X-rays and imaging revealed left tibial plateau fracture. He was transferred to [facility] and underwent ORIF with anterior compartment fasciotomy 02/19/2016 per Dr. Marcelino Scot. Nonweightbearing left lower extremity with hinged knee brace.  Hospital course pain management. Subcutaneous Lovenox for DVT prophylaxis and plan to transition to Coumadin.    Patient transferred to CIR on 02/23/2016 .    Patient currently requires moderate assistance with basic self-care skills secondary to muscle joint tightness.  Prior to hospitalization, patient could complete BADL and light iADL independently..  Patient will benefit from skilled intervention to increase independence with basic self-care skills prior to discharge home with care partner.  Anticipate patient will require intermittent supervision and no further OT follow recommended.  OT - End of Session Activity Tolerance: Tolerates 30+ min activity with multiple rests Endurance Deficit: Yes OT  Assessment Rehab Potential (ACUTE ONLY): Excellent OT Patient demonstrates impairments in the following area(s): Balance;Endurance;Pain;Safety OT Basic ADL's Functional Problem(s): Bathing;Dressing;Toileting OT Advanced ADL's Functional Problem(s): Simple Meal Preparation OT Transfers Functional Problem(s): Toilet;Tub/Shower OT Additional Impairment(s): None OT Plan OT Intensity: Minimum of 1-2 x/day, 45 to 90 minutes OT Frequency: 5 out of 7 days OT Duration/Estimated Length of Stay: 5-7 days OT Treatment/Interventions: Medical illustrator training;Patient/family education;Therapeutic Exercise;UE/LE Coordination activities;Therapeutic Activities;Pain management;Functional mobility training;DME/adaptive equipment instruction;Discharge planning OT Self Feeding Anticipated Outcome(s): Mod I OT Basic Self-Care Anticipated Outcome(s): Min A OT Toileting Anticipated Outcome(s): Mod I OT Bathroom Transfers Anticipated Outcome(s): Supervision OT Recommendation Patient destination: Home Follow Up Recommendations: None Equipment Recommended: To be determined   Skilled Therapeutic Intervention OT 1:1 initial evaluation completed with focus on transfers, adherence to NWB at LLE, pt ed on methods and goals of treatment, functional mobility using RW, endurance, and dynamic standing balance.   Pt completed bed mobility and transfers with close supervision to maintain precautions, and performed bathing/dressing at sink with steadying assist while standing to wash periarea.   Pt required assist to wash his back and buttocks.  OT advised on limitation to use of sink or EOB while awaiting approval from MD to attempt use of shower.  Pt retained use of gown vs clothing d/t fatigue.  Pt remained in w/c at end of session with all needs within reach.  OT Evaluation Precautions/Restrictions  Precautions Precautions: Fall;Knee Precaution Booklet Issued: No Required Braces or Orthoses: Other Brace/Splint Other  Brace/Splint: hinged knee brace LLE Restrictions Weight Bearing Restrictions: Yes LLE Weight Bearing: Non weight bearing  General Chart Reviewed: Yes Family/Caregiver Present: No  Vital Signs Therapy Vitals Temp: 99.4 F (37.4 C) Temp Source: Oral Pulse Rate: 96 Resp: 18 BP: 137/72 Patient Position (if appropriate): Lying Oxygen Therapy SpO2: 92 % O2 Device: Not Delivered   Pain Pain Assessment Pain Assessment: 0-10 Pain Score: 9  Pain Type: Surgical pain Pain Location: Leg Pain Orientation: Lower;Left Pain Descriptors / Indicators: Aching Pain Onset: On-going Patients Stated Pain Goal: 3 Pain Intervention(s): Medication (See eMAR);Repositioned  Home Living/Prior [place] expects to be discharged to:: Private residence Living Arrangements: Spouse/significant other Available Help at Discharge: Family, Available PRN/intermittently Type of Home: House Home Access: Stairs to enter [employer] of Steps: 3 Entrance Stairs-Rails: Right, Left (cannot reach both at the same time.) Home Layout: One level Bathroom Shower/Tub: Multimedia programmer: Standard (with safety frame attached) Bathroom Accessibility: Yes Additional Comments: Owns RW and rollator, shower has built-in seat, no grab bars, fiberglass enclosure.  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) Prior Function Level of  Independence: Independent with basic ADLs, Independent with gait  Able to Take Stairs?: Yes Driving: Yes Vocation:  On disability Leisure: Hobbies-yes (Comment)  ADL ADL ADL Comments: see Functional Assessment Tool  Vision/Perception  Vision- History Baseline Vision/History: Wears glasses Wears Glasses: At all times Patient Visual Report: No change from baseline   Cognition Overall Cognitive Status: Within Functional Limits for tasks assessed Arousal/Alertness: Awake/alert Orientation Level: Person;Place;Situation Person: Oriented Place: Oriented Situation: Oriented Year: 2017 Month: October Day of Week: Correct Memory: Appears intact Immediate Memory Recall: Sock;Blue;Bed Memory Recall: Sock;Blue;Bed Memory Recall Sock: Without Cue Memory Recall Blue: Without Cue Memory Recall Bed: Without Cue Attention: Alternating Awareness: Appears intact Problem Solving: Appears intact Safety/Judgment: Appears intact  Sensation Sensation Light Touch: Appears Intact Stereognosis: Appears Intact Hot/Cold: Appears Intact Proprioception: Appears Intact Coordination Gross Motor Movements are Fluid and Coordinated: Yes Fine Motor Movements are Fluid and Coordinated: Yes  Motor  Motor Motor: Within Functional Limits   Mobility  Bed Mobility Bed Mobility: Supine to Sit;Sit to Supine Supine to Sit: 5: Supervision Sit to Supine: 5: Supervision Transfers Sit to Stand: With upper extremity assist;4: Min guard Stand to Sit: 4: Min guard;With upper extremity assist   Trunk/Postural Assessment  Cervical Assessment Cervical Assessment: Within Functional Limits Thoracic Assessment Thoracic Assessment: Within Functional Limits Lumbar Assessment Lumbar Assessment: Within Functional Limits Postural Control Postural Control: Deficits on evaluation Protective Responses: limited by LLE NWB    Balance Balance Balance Assessed: Yes Static Standing Balance Static Standing -  Balance Support: During functional activity;Bilateral upper extremity supported Static Standing - Level of Assistance: 5: Stand by assistance   Extremity/Trunk Assessment RUE Assessment RUE Assessment: Within Functional Limits LUE Assessment LUE Assessment: Within Functional Limits   See Function Navigator for Current Functional Status.   Refer to Care Plan for Long Term Goals  Recommendations for other services: None  Discharge Criteria: Patient will be discharged from OT if patient refuses treatment 3 consecutive times without medical reason, if treatment goals not met, if there is a change in medical status, if patient makes no progress towards goals or if patient is discharged from hospital.  The above assessment, treatment plan, treatment alternatives and goals were discussed and mutually agreed upon: by patient  [facility] 02/24/2016, 4:28 PM

## 2016-02-25 ENCOUNTER — Inpatient Hospital Stay (HOSPITAL_COMMUNITY): Payer: Medicare Other | Admitting: Occupational Therapy

## 2016-02-25 ENCOUNTER — Inpatient Hospital Stay (HOSPITAL_COMMUNITY): Payer: BLUE CROSS/BLUE SHIELD | Admitting: Physical Therapy

## 2016-02-25 LAB — COMPREHENSIVE METABOLIC PANEL
ALK PHOS: 83 U/L (ref 38–126)
ALT: 24 U/L (ref 17–63)
AST: 35 U/L (ref 15–41)
Albumin: 3.3 g/dL — ABNORMAL LOW (ref 3.5–5.0)
Anion gap: 11 (ref 5–15)
BILIRUBIN TOTAL: 0.9 mg/dL (ref 0.3–1.2)
BUN: 8 mg/dL (ref 6–20)
CALCIUM: 8.9 mg/dL (ref 8.9–10.3)
CO2: 25 mmol/L (ref 22–32)
Chloride: 105 mmol/L (ref 101–111)
Creatinine, Ser: 0.81 mg/dL (ref 0.61–1.24)
GFR calc Af Amer: >60 mL/min (ref >60)
Glucose, Bld: 107 mg/dL — ABNORMAL HIGH (ref 65–99)
POTASSIUM: 3.3 mmol/L — AB (ref 3.5–5.1)
Sodium: 141 mmol/L (ref 135–145)
TOTAL PROTEIN: 6.3 g/dL — AB (ref 6.5–8.1)

## 2016-02-25 LAB — CBC WITH DIFFERENTIAL/PLATELET
Basophils Absolute: 0.1 10*3/uL (ref 0.0–0.1)
Basophils Relative: 1 %
EOS PCT: 3 %
Eosinophils Absolute: 0.3 10*3/uL (ref 0.0–0.7)
HEMATOCRIT: 39.2 % (ref 39.0–52.0)
Hemoglobin: 13.2 g/dL (ref 13.0–17.0)
Lymphocytes Relative: 18 %
Lymphs Abs: 1.6 10*3/uL (ref 0.7–4.0)
MCH: 33.8 pg (ref 26.0–34.0)
MCHC: 33.7 g/dL (ref 30.0–36.0)
MCV: 100.5 fL — AB (ref 78.0–100.0)
MONOS PCT: 10 %
Monocytes Absolute: 0.9 10*3/uL (ref 0.1–1.0)
NEUTROS ABS: 6.2 10*3/uL (ref 1.7–7.7)
Neutrophils Relative %: 68 %
Platelets: 240 10*3/uL (ref 150–400)
RBC: 3.9 MIL/uL — AB (ref 4.22–5.81)
RDW: 13 % (ref 11.5–15.5)
WBC: 9.1 10*3/uL (ref 4.0–10.5)

## 2016-02-25 LAB — PROTIME-INR
INR: 2.42
Prothrombin Time: 26.7 seconds — ABNORMAL HIGH (ref 11.4–15.2)

## 2016-02-25 MED ORDER — WARFARIN SODIUM 5 MG PO TABS
5.0000 mg | ORAL_TABLET | Freq: Once | ORAL | Status: AC
  Administered 2016-02-25: 5 mg via ORAL
  Filled 2016-02-25: qty 1

## 2016-02-25 MED ORDER — METOPROLOL SUCCINATE ER 25 MG PO TB24
12.5000 mg | ORAL_TABLET | Freq: Once | ORAL | Status: AC
  Administered 2016-02-25: 12.5 mg via ORAL
  Filled 2016-02-25: qty 1

## 2016-02-25 MED ORDER — METOPROLOL SUCCINATE ER 25 MG PO TB24
37.5000 mg | ORAL_TABLET | Freq: Every day | ORAL | Status: DC
Start: 2016-02-26 — End: 2016-02-29
  Administered 2016-02-26 – 2016-02-29 (×4): 37.5 mg via ORAL
  Filled 2016-02-25 (×4): qty 2

## 2016-02-25 MED ORDER — POTASSIUM CHLORIDE CRYS ER 20 MEQ PO TBCR
20.0000 meq | EXTENDED_RELEASE_TABLET | Freq: Two times a day (BID) | ORAL | Status: AC
  Administered 2016-02-25 – 2016-02-26 (×4): 20 meq via ORAL
  Filled 2016-02-25 (×4): qty 1

## 2016-02-25 NOTE — Progress Notes (Signed)
[facility] PHYSICAL MEDICINE & REHABILITATION     PROGRESS NOTE  Subjective/Complaints:  Pt slept well after taking his ambien.  He had a good first day of therapies yesterday.   ROS: Denies CP, SOB, N/V/D.  Objective: Vital Signs: Blood pressure (!) 153/79, pulse 92, temperature 99.3 F (37.4 C), temperature source Oral, resp. rate 18, height 5\' 6"  (1.676 m), weight 112.5 kg (248 lb 1.6 oz), SpO2 93 %. No results found.  Recent Labs  02/25/16 0337  WBC 9.1  HGB 13.2  HCT 39.2  PLT 240    Recent Labs  02/25/16 0337  NA 141  K 3.3*  CL 105  GLUCOSE 107*  BUN 8  CREATININE 0.81  CALCIUM 8.9   CBG (last 3)  No results for input(s): GLUCAP in the last 72 hours.  Wt Readings from Last 3 Encounters:  02/23/16 112.5 kg (248 lb 1.6 oz)  02/15/16 113.4 kg (250 lb)  10/20/12 105.2 kg (232 lb)    Physical Exam:  BP (!) 153/79 (BP Location: Right Arm)   Pulse 92   Temp 99.3 F (37.4 C) (Oral)   Resp 18   Ht 5\' 6"  (1.676 m)   Wt 112.5 kg (248 lb 1.6 oz)   SpO2 93%   BMI 40.04 kg/m  Constitutional: Well-developed. Obese. NAD. HENT: Normocephalic. Atraumatic. Eyes: EOMare normal. No discharge.  Cardiovascular: Normal rateand regular rhythm.  Respiratory: Effort normaland breath sounds normal. No respiratory distress.  GI: Soft. Bowel sounds are normal. He exhibits no distension.  Musc: Hinged knee brace LLE Neurological: He is alertand oriented.  Motor: B/l UE, RLE: 5/5 LLE: Hip flexion 3/5, ankle dorsi/plantar flexion 4/5 Skin: Incision clean and dry.   Assessment/Plan: 1. Functional deficits secondary to left tibial plateau fracture status post ORIF which require 3+ hours per day of interdisciplinary therapy in a comprehensive inpatient rehab setting. Physiatrist is providing close team supervision and 24 hour management of active medical problems listed below. Physiatrist and rehab team continue to assess barriers to discharge/monitor patient progress  toward functional and medical goals.  Function:  Bathing Bathing position   Position: Standing at sink  Bathing parts Body parts bathed by patient: Right arm, Left arm, Chest, Abdomen, Front perineal area, Right upper leg Body parts bathed by helper: Buttocks  Bathing assist Assist Level: Touching or steadying assistance(Pt > 75%)      Upper Body Dressing/Undressing Upper body dressing   What is the patient wearing?: Hospital gown                Upper body assist Assist Level: Touching or steadying assistance(Pt > 75%)      Lower Body Dressing/Undressing Lower body dressing   What is the patient wearing?: Hospital Gown, Non-skid slipper socks, Ted Hose           Non-skid slipper socks- Performed by helper: Don/doff right sock               TED Hose - Performed by helper: Don/doff left TED hose  Lower body assist Assist for lower body dressing: Touching or steadying assistance (Pt > 75%)      Toileting Toileting     Toileting steps completed by helper: Adjust clothing prior to toileting, Performs perineal hygiene, Adjust clothing after toileting    Toileting assist     Transfers Chair/bed transfer   Chair/bed transfer method: Stand pivot, Ambulatory Chair/bed transfer assist level: Touching or steadying assistance (Pt > 75%) Chair/bed transfer assistive device: Armrests, Environmental consultant  Locomotion Ambulation     Max distance: 15 ft Assist level: Supervision or verbal cues   Wheelchair   Type: Manual Max wheelchair distance: 150 ft Assist Level: Supervision or verbal cues  Cognition Comprehension Comprehension assist level: Follows basic conversation/direction with no assist  Expression Expression assist level: Expresses basic needs/ideas: With no assist  Social Interaction Social Interaction assist level: Interacts appropriately with others - No medications needed.  Problem Solving Problem solving assist level: Solves basic problems with no assist   Memory Memory assist level: Recognizes or recalls 90% of the time/requires cueing < 10% of the time    Medical Problem List and Plan: 1.  Decreased functional mobility secondary to left tibial plateau fracture status post ORIF 02/19/2016.   Non-weightbearing  Cont CIR 2.  DVT Prophylaxis/Anticoagulation:   Coumadin per pharmacy protocol, INR therapeutic  Monitor for any bleeding episodes.   Vascular study negative 10/22 3. Pain Management: Oxycodone and Robaxin as needed. Monitor with increased mobility 4. Mood. Lamictal 200 mg daily, Pristiq 100 mg daily,Brexipiprazole 1 mg daily. 5. Neuropsych: This patient is capable of making decisions on his own behalf. 6. Skin/Wound Care: Routine skin checks 7. Fluids/Electrolytes/Nutrition: Routine I&Os 8. Hypertension.   Lisinopril 40 mg daily  Toprol 25 mg daily, increased to 37.5 on 10/23.    Monitor with increased mobility 9. BPH. Flomax 0.4 mg daily. PVRs 3 pending 10. Hyperlipidemia. Crestor 11. Hypokalemia:   K 3.3 on 10/23  Supplemented  Will cont to monitor 12. Insomnia  Home Ambien 10mg  started on 10/22  LOS (Days) 2 A FACE TO FACE EVALUATION WAS PERFORMED  Hyatt Capobianco Karis Jubanil Toniesha Zellner 02/25/2016 8:55 AM

## 2016-02-25 NOTE — Progress Notes (Signed)
Occupational Therapy Session Note  Patient Details  Name: Adrian Rivas MRN: [id number] Date of Birth: 04-06-1961  Today's Date: 02/25/2016 OT Individual Time: [id number] OT Individual Time Calculation (min): 63 min     Short Term Goals: Week 1:  OT Short Term Goal 1 (Week 1): STG=LTG due to short anticipated LOS  Skilled Therapeutic Interventions/Progress Updates:   Pt participated in skilled OT session focusing on adherence to NWB precautions during ADL retraining. Pt was supine in bed at time of arrival and agreeable to bathe at sink. Pt did not want to dress 2/2 wife bringing looser clothing in evening. Stand pivot transfer to w/c completed with steady assist, RW, and instruction on hand placement to adhere to precautions during power up. Pt required safety cuing with w/c and sit<stands during bathing. Oral care/grooming tasks completed with more than reasonable time. Simulated LB dressing completed with theraband to assess pts need for AE. Pt demonstrated ability to thread LEs (including hinged knee brace) without use of reacher. Pt able to wash right foot and don right sock with encouragement. At end of tx pt was left in w/c with all needs within reach.   Therapy Documentation Precautions:  Precautions Precautions: Fall, Knee Precaution Booklet Issued: No Required Braces or Orthoses: Other Brace/Splint Other Brace/Splint: hinged knee brace LLE Restrictions Weight Bearing Restrictions: Yes LLE Weight Bearing: Non weight bearing General:   Vital Signs: Therapy Vitals Pulse Rate: 68 BP: 137/74 Pain: No c/o pain during session  Pain Assessment Pain Assessment: 0-10 Pain Score: 8  Pain Type: Acute pain Pain Location: Knee Pain Orientation: Left Pain Descriptors / Indicators: Aching Pain Onset: On-going Pain Intervention(s): RN made aware;Rest ADL: ADL ADL Comments: see Functional Assessment Tool:    See Function Navigator for Current Functional  Status.   Therapy/Group: Individual Therapy  Adrian Rivas 02/25/2016, 12:46 PM

## 2016-02-25 NOTE — Progress Notes (Signed)
ANTICOAGULATION CONSULT NOTE - Follow Up Consult  Pharmacy Consult for coumadin Indication: VTE prophylaxis  Allergies  Allergen Reactions  . No Known Allergies     Patient Measurements: Height: 5\' 6"  (167.6 cm) Weight: 248 lb 1.6 oz (112.5 kg) IBW/kg (Calculated) : 63.8 Heparin Dosing Weight:   Vital Signs: Temp: 99.3 F (37.4 C) (10/23 0533) Temp Source: Oral (10/23 0533) BP: 153/79 (10/23 0533) Pulse Rate: 92 (10/23 0533)  Labs:  Recent Labs  02/23/16 0354 02/24/16 0431 02/25/16 0337  HGB  --   --  13.2  HCT  --   --  39.2  PLT  --   --  240  LABPROT 22.5* 24.4* 26.7*  INR 1.95 2.16 2.42  CREATININE  --   --  0.81    Estimated Creatinine Clearance: 120 mL/min (by C-G formula based on SCr of 0.81 mg/dL).   Medications:  Scheduled:  . Brexpiprazole  1 tablet Oral Daily  . desvenlafaxine  100 mg Oral q morning - 10a  . lamoTRIgine  200 mg Oral q morning - 10a  . lisinopril  40 mg Oral Daily  . metoprolol succinate  25 mg Oral Daily  . rosuvastatin  20 mg Oral QHS  . tamsulosin  0.4 mg Oral Daily  . Warfarin - Pharmacist Dosing Inpatient   Does not apply q1800   Infusions:    Assessment: 55 yo male s/p ortho surgery is currently on therapeutic coumadin.  INR today is 2.42.  Goal of Therapy:  INR 2-3 Monitor platelets by anticoagulation protocol: Yes   Plan:  - coumadin 5mg  po x1 - INR in am  Bita Cartwright, Tsz-Yin 02/25/2016,8:28 AM

## 2016-02-25 NOTE — Progress Notes (Signed)
Physical Therapy Session Note  Patient Details  Name: Adrian Rivas MRN: [id number] Date of Birth: [birth year]  Today's Date: 02/25/2016 PT Individual Time: 0915-1030 PT Individual Time Calculation (min): 75 min   Short Term Goals: Week 1:  PT Short Term Goal 1 (Week 1): = LTGs due to anticipated LOS  Skilled Therapeutic Interventions/Progress Updates:    Session focused on functional mobility, standing tolerance/balance, LE strengthening and ROM, and activity tolerance. Patient in wheelchair upon arrival, hinged knee brace noted to be unlocked and readjusted/locked in supine. Patient propelled wheelchair using BUE 2 x 150 ft with supervision and increased time with assist progressed to supervision for managing elevating leg rest, performed ambulatory transfers wheelchair <> mat table using RW with supervision, sit <> supine on mat table with supervision, supine BLE therex: ankle pumps x 20, glute sets with 5 sec hold x 20, RLE heel slides x 20, and LLE assisted SLR 2 x 5, gait training using RW x 22 ft with close supervision, and simulated car transfer using RW with supervision with initial demonstration for technique. Patient left supine in bed with all needs within reach.   Therapy Documentation Precautions:  Precautions Precautions: Fall, Knee Precaution Booklet Issued: No Required Braces or Orthoses: Other Brace/Splint Other Brace/Splint: hinged knee brace LLE Restrictions Weight Bearing Restrictions: Yes LLE Weight Bearing: Non weight bearing Vital Signs: Therapy Vitals Pulse Rate: 68 BP: 137/74 Pain: Pain Assessment Pain Assessment: 0-10 Pain Score: 8  Pain Type: Acute pain Pain Location: Knee Pain Orientation: Left Pain Descriptors / Indicators: Aching Pain Onset: On-going Pain Intervention(s): RN made aware;Rest   See Function Navigator for Current Functional Status.   Therapy/Group: Individual Therapy  Kerney ElbeVarner, Ajla Mcgeachy A 02/25/2016, 10:28 AM

## 2016-02-25 NOTE — Progress Notes (Signed)
Social Work Assessment and Plan Social Work Assessment and Plan  Patient Details  Name: Adrian Rivas MRN: [id number] Date of Birth: September 02, 1960  Today's Date: 02/25/2016  Problem List:  Patient Active Problem List   Diagnosis Date Noted  . Closed fracture of left tibial plateau   . Subtherapeutic international normalized ratio (INR)   . Benign essential HTN   . Benign prostatic hyperplasia   . Primary insomnia   . Hip fracture (HCC) 02/23/2016  . Tibial plateau fracture, left   . Post-operative pain   . Hypokalemia   . Benign prostatic hyperplasia without lower urinary tract symptoms   . Hyperlipidemia   . Abnormality of gait   . COPD exacerbation (HCC)   . Closed tibia fracture 02/15/2016  . Closed bicondylar fracture of left tibial plateau 02/15/2016  . HTN (hypertension)   . Spinal stenosis of lumbar region 10/20/2012  . PULMONARY NODULE 11/10/2007  . CHEST PAIN 11/10/2007   Past Medical History:  Past Medical History:  Diagnosis Date  . Anxiety   . Arthritis   . COPD (chronic obstructive pulmonary disease) (HCC)    TOLD HE HAS COPD - QUIT SMOKING 15 YRS AGO-- NO C/O OF SOB  . Depression   . Dyslipidemia   . HTN (hypertension)   . Pain    CHRONIC NECK PAIN- HX OF ANTERIOR FUSION  . Pain    LOWER BACK WITH PAIN DOWN BOTH LEGS - BUT WORSE LEFT LEG WITH NUMBNESS OUTER SIDE OF LEFT LEG - HAS SPINAL STENOSIS   Past Surgical History:  Past Surgical History:  Procedure Laterality Date  . "SMALL BODIES REMOVED" FROM BOTH SHOULDERS    . BACK SURGERY     LUMBAR LAMINECTOMY  . BILATERAL CARPAL TUNNEL RELEASE    . CERVICAL FUSION    . FASCIOTOMY Left 02/19/2016   Procedure: ANTERIOR COMPARTMENT FASCIOTOMY;  Surgeon: [name], MD;  [facility];  Service: Orthopedics;  Laterality: Left;  . LUMBAR LAMINECTOMY/DECOMPRESSION MICRODISCECTOMY N/A 10/20/2012   Procedure: DECOMPRESSION L2-L3, L1-L2;  Surgeon: [name], MD;  Location: [facility] ORS;  Service:  Orthopedics;  Laterality: N/A;  . ORIF TIBIA PLATEAU Left 02/19/2016  . ORIF TIBIA PLATEAU Left 02/19/2016   Procedure: OPEN REDUCTION INTERNAL FIXATION (ORIF) TIBIAL PLATEAU;  Surgeon: [name], MD;  [facility];  Service: Orthopedics;  Laterality: Left;  . RIGHT ACL RECONSTRUCTION     Social History:  reports that he has quit smoking. His smoking use included Cigarettes. He has a 20.00 pack-year smoking history. He has never used smokeless tobacco. He reports that he does not drink alcohol or use drugs.  Family / Support Systems Marital Status: Married Patient Roles: Spouse, Parent Spouse/Significant Other: Adrian LundJanet [phone]-home  [phone]-cell Children: Adrian Rivas-daughter [phone]-cell Other Supports: Step-son in the home Anticipated Caregiver: Wife and step-son Ability/Limitations of Caregiver: Wife works days but step-son is there in the home while wife is working Medical laboratory scientific officerCaregiver Availability: Evenings only Family Dynamics: Close knit family who will do for one another, wife and children are supportive and involved. Pt realizes this is short term and feels he can endure for a short time with this.   Social History Preferred language: English Religion: Baptist Cultural Background: No issues Education: High School Read: Yes Write: Yes Employment Status: Disabled Date Retired/Disabled/Unemployed: 2007 back issues Fish farm managerLegal Hisotry/Current Legal Issues: No issues Guardian/Conservator: none-according to MD pt is capable of making his own decisions while here   Abuse/Neglect Physical Abuse: Denies Verbal Abuse: Denies Sexual Abuse:  Denies Exploitation of patient/patient's resources: Denies Self-Neglect: Denies  Emotional Status Pt's affect, behavior adn adjustment status: Pt is able to explaion his fall and fracture as a result of the fall. He is NWB for 6 weeks according to the MD. He reports: " I didn't need this with my back issues."  He is motivated and ready to do rehab  and get back home. Recent Psychosocial Issues: other health issues-multiple back surgeries and shoulder surgeries Pyschiatric History: No history deferred depression due to pt is coping appropriately with his fracture and WB issues. Will probably be a short length of stay due to high level, will monitor and have neuro-psych see if needed. Substance Abuse History: No issues  Patient / Family Perceptions, Expectations & Goals Pt/Family understanding of illness & functional limitations: Pt is able to explain his surgery and fracture, he does talk with the MD daily and feels he has a good understanding of his treatment plan.  Premorbid pt/family roles/activities: Husband, Father, retiree, home owner, etc Anticipated changes in roles/activities/participation: resume Pt/family expectations/goals: Pt states: " I want to get as independent as I can here, before going home."  Wife states: " I can do when I come home but am gone during the day at work."  [employer]: None Premorbid Home Care/DME Agencies: Other (Comment) (ref made to [facility] on acute for follow up) Transportation available at discharge: family  Discharge Planning Living Arrangements: Spouse/significant other, Children Support Systems: Spouse/significant other, Children, Other relatives, Friends/neighbors, Church/faith community Type of Residence: Private residence Insurance Resources: [employer], Media planner (specify) Herbalist) Financial Resources: SSD, Family Support Financial Screen Referred: No Living Expenses: Own Money Management: Spouse, Patient Does the patient have any problems obtaining your medications?: No Home Management: Wife pt does help some but limited due to back issues Patient/Family Preliminary Plans: Return home with wife and step-son needs to be mod/i level before going home since wife works during the day and step-son can hlep if needed. Pt is doing well and will be a short length of  stay here. Will await team's evaluations. Barriers to Discharge: Steps Social Work Anticipated Follow Up Needs: HH/OP  Clinical Impression Pleasant gentleman who is tired from tow therapies in a row. He is motivated to do well but wants to be mod/i before going home. He will have assist form wife in the evenings and step-son if necessary while wife works. Will work on discharge needs due to will be short length of stay.   Lucy Chris 02/25/2016, 11:07 AM

## 2016-02-25 NOTE — Care Management Note (Signed)
Inpatient Rehabilitation Center Individual Statement of Services  Patient Name:  Adrian Rivas  Date:  02/25/2016  Welcome to the Inpatient Rehabilitation Center.  Our goal is to provide you with an individualized program based on your diagnosis and situation, designed to meet your specific needs.  With this comprehensive rehabilitation program, you will be expected to participate in at least 3 hours of rehabilitation therapies Monday-Friday, with modified therapy programming on the weekends.  Your rehabilitation program will include the following services:  Physical Therapy (PT), Occupational Therapy (OT), 24 hour per day rehabilitation nursing, Therapeutic Recreaction (TR), Case Management (Social Worker), Rehabilitation Medicine, Nutrition Services and Pharmacy Services  Weekly team conferences will be held on Wedensday to discuss your progress.  Your Social Worker will talk with you frequently to get your input and to update you on team discussions.  Team conferences with you and your family in attendance may also be held.  Expected length of stay: 5-7 days  Overall anticipated outcome: mod/i-min with bathing and dressing  Depending on your progress and recovery, your program may change. Your Social Worker will coordinate services and will keep you informed of any changes. Your Social Worker's name and contact numbers are listed  below.  The following services may also be recommended but are not provided by the Inpatient Rehabilitation Center:   Driving Evaluations  Home Health Rehabiltiation Services  Outpatient Rehabilitation Services    Arrangements will be made to provide these services after discharge if needed.  Arrangements include referral to agencies that provide these services.  Your insurance has been verified to be:  BCBS & Medicare Part A Your primary doctor is:  [name]  Pertinent information will be shared with your doctor and your insurance  company.  Social Worker:  [name] Jestina Stephani, SW [phone] or [phone]) [phone]  Information discussed with and copy given to patient by: [name], [name] G, 02/25/2016, 8:57 AM

## 2016-02-25 NOTE — Progress Notes (Signed)
Occupational Therapy Session Note  Patient Details  Name: Adrian Rivas MRN: [id number] Date of Birth: Sep 05, 1960  Today's Date: 02/25/2016  Session 1 OT Individual Time: 1130-1200 OT Individual Time Calculation (min): 30 min   Session 2 OT Individual Time: 1300-1330 OT Individual Time Calculation (min): 30 min    Short Term Goals: Week 1:  OT Short Term Goal 1 (Week 1): STG=LTG due to short anticipated LOS  Skilled Therapeutic Interventions/Progress Updates:    Session 1 1:1 OT session focused on transfer training, standing endurance, and dynamic standing balance. Pt completed stand-step turn transfers w/ RW and min A while maintaining NWB LLE. Pt brought to therapy gym and participated in standing activity. Min A for sit<>stands, and overall CGA to reach outside base of support. Pt required 2 seated rest breaks after 4 minutes of standing activity. Pt returned to room at end of session and left with needs met.   Session 2 1:1 OT session focused on simple meal prep and kitchen mobility using RW  . Pt given walker bag and discussed use for simple meal prep. Pt/therapist collaboration on kitchen modifications and safe access using RW . Pt able to ambulate 3 feet from w/c to counter w/ min guard A, reach in top cabinet and place items in walker bag. Pt took standing rest break, then ambulated 3 feet to table for seated meal prep. Pt ambulated 5 feet to return items and reached max fatigue requiring w/c to be brought behind pt for seated rest break. Pt returned to bed at end of session with Min A for LLE.    Therapy Documentation Precautions:  Precautions Precautions: Fall, Knee Precaution Booklet Issued: No Required Braces or Orthoses: Other Brace/Splint Other Brace/Splint: hinged knee brace LLE Restrictions Weight Bearing Restrictions: Yes LLE Weight Bearing: Non weight bearing Pain: Pain Assessment Pain Assessment: 0-10 Pain Score: 8  Pain Type: Acute pain Pain Location:  Knee Pain Orientation: Left Pain Descriptors / Indicators: Aching Pain Onset: On-going Pain Intervention(s):Rest ADL: ADL ADL Comments: see Functional Assessment Tool  See Function Navigator for Current Functional Status.   Therapy/Group: Individual Therapy  Valma Cava 02/25/2016, 4:03 PM

## 2016-02-25 NOTE — Progress Notes (Signed)
Patient information reviewed and entered into eRehab system by Ariella Voit, RN, CRRN, PPS Coordinator.  Information including medical coding and functional independence measure will be reviewed and updated through discharge.     Per nursing patient was given "Data Collection Information Summary for Patients in Inpatient Rehabilitation Facilities with attached "Privacy Act Statement-Health Care Records" upon admission.  

## 2016-02-26 ENCOUNTER — Inpatient Hospital Stay (HOSPITAL_COMMUNITY): Payer: Medicare Other | Admitting: Occupational Therapy

## 2016-02-26 ENCOUNTER — Inpatient Hospital Stay (HOSPITAL_COMMUNITY): Payer: Medicare Other

## 2016-02-26 ENCOUNTER — Inpatient Hospital Stay (HOSPITAL_COMMUNITY): Payer: Medicare Other | Admitting: Physical Therapy

## 2016-02-26 ENCOUNTER — Inpatient Hospital Stay (HOSPITAL_COMMUNITY): Payer: BLUE CROSS/BLUE SHIELD | Admitting: Physical Therapy

## 2016-02-26 LAB — PROTIME-INR
INR: 2.82
PROTHROMBIN TIME: 30.2 s — AB (ref 11.4–15.2)

## 2016-02-26 MED ORDER — WARFARIN SODIUM 5 MG PO TABS
5.0000 mg | ORAL_TABLET | Freq: Once | ORAL | Status: AC
  Administered 2016-02-26: 5 mg via ORAL
  Filled 2016-02-26: qty 1

## 2016-02-26 NOTE — Progress Notes (Signed)
Occupational Therapy Session Note  Patient Details  Name: Adrian Rivas MRN: [id number] Date of Birth: 09/03/1960  Today's Date: 02/26/2016 OT Individual Time: 1300-1430 OT Individual Time Calculation (min): 90 min     Short Term Goals: Week 1:  OT Short Term Goal 1 (Week 1): STG=LTG due to short anticipated LOS  Skilled Therapeutic Interventions/Progress Updates:    1:1 OT session focused on modified bathing/dressing, sit<>stands, ADL AE , dynamic standing balance, activity tolerance, and home safety modifications. Pt/therapist collaboration re: ADL participation at home. Pt reports difficulty with toileting PTA 2/2 multiple back surgeries. Pt has spouse assist or gets in the shower for hygiene. Discussed that this will not be an option at pt's current status 2/2 limited to sponge baths. OT educated pt on toilet aids and strategies. Pt completed bathing/dressing at the sink. He required CGA sit<>stand and CGA fo standing balance when washing peri-area and when pulling pants over hips. Pt able to thread B LE 's through pant leg, and don R shoe with OT demonstration on use of long-handled equipment. Pt content with having spouse assist with tying R shoe despite OT encouragement for independence. Pt then brought to therapy gym and participated in dynamic standing activities and functional mobility. Incorporated weight shifting, core stability, reaching outside base of support, and use of reacher. Pt required CGA for dynamic balance when reaching outside base of support w/ unilateral Ue. Pt returned to room and OT educated pt on proper fit and don/doff of hinged knee brace in supported position to don/dofff TED hose. Pt left in care of nurse tech.    Therapy Documentation Precautions:  Precautions Precautions: Fall, Knee Precaution Booklet Issued: No Required Braces or Orthoses: Other Brace/Splint Other Brace/Splint: hinged knee brace LLE Restrictions Weight Bearing Restrictions: (P) Yes LLE  Weight Bearing: (P) Non weight bearing Pain: Pain Assessment Pain Assessment: 0-10 Pain Score: 8  Pain Type: Acute pain Pain Location: Knee Pain Orientation: Left Pain Descriptors / Indicators: Aching Pain Onset: On-going Pain Intervention(s): RN made aware;Repositioned ADL: ADL ADL Comments: see Functional Assessment Tool    See Function Navigator for Current Functional Status.   Therapy/Group: Individual Therapy  Mal Amabilelisabeth S Tyriek Hofman 02/26/2016, 2:31 PM

## 2016-02-26 NOTE — Progress Notes (Signed)
Social Work Patient ID: Adrian Rivas, male   DOB: [birth year], 54 y.o.   MRN: [id number]  Met with pt to give him the three portable ramp companies for a resource for his wife to pursue for home. Pt reports the [employer] Ramp would cost $550.00 for a month. He will need assistance with the three steps he has to get into the home. PT working on different options also. Will continue to work on discharge needs.

## 2016-02-26 NOTE — Progress Notes (Signed)
ANTICOAGULATION CONSULT NOTE - Follow Up Consult  Pharmacy Consult for coumadin Indication: VTE prophylaxis  Allergies  Allergen Reactions  . No Known Allergies     Patient Measurements: Height: 5\' 6"  (167.6 cm) Weight: 248 lb 1.6 oz (112.5 kg) IBW/kg (Calculated) : 63.8 Heparin Dosing Weight:   Vital Signs: Temp: 97.1 F (36.2 C) (10/24 0521) Temp Source: Oral (10/24 0521) BP: 143/82 (10/24 0521) Pulse Rate: 87 (10/24 0521)  Labs:  Recent Labs  02/24/16 0431 02/25/16 0337 02/26/16 0315  HGB  --  13.2  --   HCT  --  39.2  --   PLT  --  240  --   LABPROT 24.4* 26.7* 30.2*  INR 2.16 2.42 2.82  CREATININE  --  0.81  --     Estimated Creatinine Clearance: 120 mL/min (by C-G formula based on SCr of 0.81 mg/dL).   Medications:  Scheduled:  . Brexpiprazole  1 tablet Oral Daily  . desvenlafaxine  100 mg Oral q morning - 10a  . lamoTRIgine  200 mg Oral q morning - 10a  . lisinopril  40 mg Oral Daily  . metoprolol succinate  37.5 mg Oral Daily  . potassium chloride  20 mEq Oral BID  . rosuvastatin  20 mg Oral QHS  . tamsulosin  0.4 mg Oral Daily  . Warfarin - Pharmacist Dosing Inpatient   Does not apply q1800   Infusions:    Assessment: 55 yo male s/p ORIF is currently on therapeutic coumadin; INR up to 2.82 from 2.42 (probably due to the 10 mg of coumadin he got few days ago).  Goal of Therapy:  INR 2-3 Monitor platelets by anticoagulation protocol: Yes   Plan:  - coumadin 5 mg po x1 - INR in am  Marveen Donlon, Tsz-Yin 02/26/2016,8:16 AM

## 2016-02-26 NOTE — Progress Notes (Signed)
Physical Therapy Session Note  Patient Details  Name: Adrian Rivas MRN: [id number] Date of Birth: [birth year]  Today's Date: 02/26/2016 PT Individual Time: 1455-1525 PT Individual Time Calculation (min): 30 min   Short Term Goals: Week 1:  PT Short Term Goal 1 (Week 1): = LTGs due to anticipated LOS  Skilled Therapeutic Interventions/Progress Updates:  Pt received in bed reporting increased pain in L knee, premedicated.  Pt willing to participate.  Performed supine > sit with min A and performed stand pivot to w/c with RW and min A.  Pt reports that he will more than likely have a ramp at D/C.  Pt performed up/down ramp with w/c with mod A to prevent rolling posterior and min A to descend to control speed.  Also performed gait up/down ramp with RW and min A.  Returned to room and pt performed transfer back to bed stand pivot with min A and performed sit > supine with supervision.  Elevated LLE and placed ice pack on knee for pain and edema management.  Pt left with all items within reach.  Therapy Documentation Precautions:  Precautions Precautions: Fall, Knee Precaution Booklet Issued: No Required Braces or Orthoses: Other Brace/Splint Other Brace/Splint: hinged knee brace LLE Restrictions Weight Bearing Restrictions: (P) Yes LLE Weight Bearing: (P) Non weight bearing Vital Signs: Therapy Vitals Temp: 99.1 F (37.3 C) Temp Source: Oral Pulse Rate: 94 Resp: 18 BP: 121/77 Patient Position (if appropriate): Lying Oxygen Therapy SpO2: 94 % O2 Device: Not Delivered Pain: Pain Assessment Pain Assessment: 0-10 Pain Score: 8  Pain Type: Acute pain Pain Location: Knee Pain Orientation: Left Pain Descriptors / Indicators: Aching Pain Onset: On-going Pain Intervention(s): RN made aware;Repositioned   See Function Navigator for Current Functional Status.   Therapy/Group: Individual Therapy  Edman CircleHall, Zaccheaus Storlie [facility]ette 02/26/2016, 4:42 PM

## 2016-02-26 NOTE — Progress Notes (Signed)
Occupational Therapy Note  Patient Details  Name: Adrian Rivas MRN: [id number] Date of Birth: [birth year]  Today's Date: 02/26/2016 OT Individual Time: 1000-1030 OT Individual Time Calculation (min): 30 min    Pt denied pain Individual therapy  Pt resting in w/c upon arrival.  Pt transitioned to ADL apartment and educated on appropriate bathing equipment (shower seat and tub transfer bench). Pt practiced sit<>stand from w/c at close supervision level and w/c<>sofa transfers with min A for sit>stand.  Pt returned to room and remained in w/c with all needs within reach.  Focus on activity tolerance, functional transfers, sit<>stand, DME education, and safety awareness to increase independence with BADLs.   [name], [name] [facility]ell 02/26/2016, 12:11 PM

## 2016-02-26 NOTE — Progress Notes (Signed)
[place] PHYSICAL MEDICINE & REHABILITATION     PROGRESS NOTE  Subjective/Complaints:  Pt laying in bed this AM.  Family at bedside.  He slept well overnight.  He has questions about his pain medications.   ROS: Denies CP, SOB, N/V/D.  Objective: Vital Signs: Blood pressure (!) 143/82, pulse 87, temperature 97.1 F (36.2 C), temperature source Oral, resp. rate 18, height 5\' 6"  (1.676 m), weight 112.5 kg (248 lb 1.6 oz), SpO2 100 %. No results found.  Recent Labs  02/25/16 0337  WBC 9.1  HGB 13.2  HCT 39.2  PLT 240    Recent Labs  02/25/16 0337  NA 141  K 3.3*  CL 105  GLUCOSE 107*  BUN 8  CREATININE 0.81  CALCIUM 8.9   CBG (last 3)  No results for input(s): GLUCAP in the last 72 hours.  Wt Readings from Last 3 Encounters:  02/23/16 112.5 kg (248 lb 1.6 oz)  02/15/16 113.4 kg (250 lb)  10/20/12 105.2 kg (232 lb)    Physical Exam:  BP (!) 143/82 (BP Location: Left Arm)   Pulse 87   Temp 97.1 F (36.2 C) (Oral)   Resp 18   Ht 5\' 6"  (1.676 m)   Wt 112.5 kg (248 lb 1.6 oz)   SpO2 100%   BMI 40.04 kg/m  Constitutional: Well-developed. Obese. NAD. HENT: Normocephalic. Atraumatic. Eyes: EOMare normal. No discharge.  Cardiovascular: Normal rateand regular rhythm.  Respiratory: Effort normaland breath sounds normal. No respiratory distress.  GI: Soft. Bowel sounds are normal. He exhibits no distension.  Musc: Hinged knee brace LLE Neurological: He is alertand oriented.  Motor: B/l UE, RLE: 5/5 LLE: Hip flexion 3/5, ankle dorsi/plantar flexion 4/5 Skin: Incision clean and dry.   Assessment/Plan: 1. Functional deficits secondary to left tibial plateau fracture status post ORIF which require 3+ hours per day of interdisciplinary therapy in a comprehensive inpatient rehab setting. Physiatrist is providing close team supervision and 24 hour management of active medical problems listed below. Physiatrist and rehab team continue to assess barriers to  discharge/monitor patient progress toward functional and medical goals.  Function:  Bathing Bathing position   Position: Wheelchair/chair at sink  Bathing parts Body parts bathed by patient: Right arm, Left arm, Chest, Abdomen, Front perineal area, Buttocks, Right upper leg, Right lower leg Body parts bathed by helper: Back  Bathing assist Assist Level: Touching or steadying assistance(Pt > 75%)      Upper Body Dressing/Undressing Upper body dressing   What is the patient wearing?: Hospital gown                Upper body assist Assist Level: Touching or steadying assistance(Pt > 75%)      Lower Body Dressing/Undressing Lower body dressing   What is the patient wearing?: Non-skid slipper socks, Ted Hose, Hospital Gown         Non-skid slipper socks- Performed by patient: Don/doff right sock Non-skid slipper socks- Performed by helper: Don/doff right sock               TED Hose - Performed by helper: Don/doff right TED hose  Lower body assist Assist for lower body dressing: Touching or steadying assistance (Pt > 75%)      Toileting Toileting     Toileting steps completed by helper: Adjust clothing prior to toileting, Performs perineal hygiene, Adjust clothing after toileting    Toileting assist     Transfers Chair/bed transfer   Chair/bed transfer method: Stand pivot Chair/bed  transfer assist level: Touching or steadying assistance (Pt > 75%) Chair/bed transfer assistive device: Armrests, Patent attorneyWalker     Locomotion Ambulation     Max distance: 22 ft Assist level: Supervision or verbal cues   Wheelchair   Type: Manual Max wheelchair distance: 150 ft Assist Level: Supervision or verbal cues  Cognition Comprehension Comprehension assist level: Follows complex conversation/direction with extra time/assistive device  Expression Expression assist level: Expresses complex ideas: With extra time/assistive device  Social Interaction Social Interaction assist  level: Interacts appropriately with others with medication or extra time (anti-anxiety, antidepressant).  Problem Solving Problem solving assist level: Solves complex 90% of the time/cues < 10% of the time  Memory Memory assist level: Complete Independence: No helper    Medical Problem List and Plan: 1.  Decreased functional mobility secondary to left tibial plateau fracture status post ORIF 02/19/2016.   Non-weightbearing  Cont CIR 2.  DVT Prophylaxis/Anticoagulation:   Coumadin per pharmacy protocol, INR therapeutic  Monitor for any bleeding episodes.   Vascular study negative 10/22 3. Pain Management: Oxycodone and Robaxin as needed. Monitor with increased mobility 4. Mood. Lamictal 200 mg daily, Pristiq 100 mg daily,Brexipiprazole 1 mg daily. 5. Neuropsych: This patient is capable of making decisions on his own behalf. 6. Skin/Wound Care: Routine skin checks 7. Fluids/Electrolytes/Nutrition: Routine I&Os 8. Hypertension.   Lisinopril 40 mg daily  Toprol 25 mg daily, increased to 37.5 on 10/23.    Overall improving 9. BPH. Flomax 0.4 mg daily. PVRs 3 remain pending 10. Hyperlipidemia. Crestor 11. Hypokalemia:   K 3.3 on 10/23  Supplemented  Will cont to monitor 12. Insomnia  Home Ambien 10mg  started on 10/22  LOS (Days) 3 A FACE TO FACE EVALUATION WAS PERFORMED  Adrian Rivas 02/26/2016 9:34 AM

## 2016-02-26 NOTE — Progress Notes (Signed)
Physical Therapy Session Note  Patient Details  Name: Adrian Rivas MRN: [id number] Date of Birth: [birth year]  Today's Date: 02/26/2016 PT Individual Time: 0902-1000 PT Individual Time Calculation (min): 58 min    Short Term Goals: Week 1:  PT Short Term Goal 1 (Week 1): = LTGs due to anticipated LOS  Skilled Therapeutic Interventions/Progress Updates:    Session focused on functional mobility, maintaining NWB LLE precautions, standing tolerance/balance, strengthening/ROM and activity tolerance. Patient in bed with wife present upon arrival, discussed temporary ramp and DC planning before wife departed. Patient propelled wheelchair using BUE x 150 ft and managed leg rests, performed stand pivot and ambulatory transfers using RW, gait training using RW x 25 ft including 2 turns in controlled environment, and bed mobility with and without rails with supervision and verbal cues to maintain NWB LLE during mobility tasks. TUG using RW = 57 sec. Patient instructed in negotiating up/down 3" step using RW ascending backwards and descending forwards with min A overall and increased effort. Patient reports having 3 7" steps to enter home and discussed continued need for ramp due to decreased activity tolerance and LLE pain. Supine assisted LLE SLR 2 x 5 with cues for breathing for L hip strengthening to maintain NWB precautions. Patient required rest breaks throughout session due to pain and shortness of breath. Patient left sitting in wheelchair with all needs within reach.   Therapy Documentation Precautions:  Precautions Precautions: Fall, Knee Precaution Booklet Issued: No Required Braces or Orthoses: Other Brace/Splint Other Brace/Splint: hinged knee brace LLE Restrictions Weight Bearing Restrictions: Yes LLE Weight Bearing: Non weight bearing Pain: Pain Assessment Pain Assessment: 0-10 Pain Score: 8  Pain Type: Acute pain Pain Location: Knee Pain Orientation: Left Pain Descriptors /  Indicators: Aching Pain Onset: On-going Pain Intervention(s): RN made aware;Rest   See Function Navigator for Current Functional Status.   Therapy/Group: Individual Therapy  Kerney ElbeVarner, Allisha Harter A 02/26/2016, 9:59 AM

## 2016-02-27 ENCOUNTER — Inpatient Hospital Stay (HOSPITAL_COMMUNITY): Payer: Medicare Other | Admitting: Occupational Therapy

## 2016-02-27 ENCOUNTER — Inpatient Hospital Stay (HOSPITAL_COMMUNITY): Payer: BLUE CROSS/BLUE SHIELD | Admitting: Physical Therapy

## 2016-02-27 ENCOUNTER — Inpatient Hospital Stay (HOSPITAL_COMMUNITY): Payer: BLUE CROSS/BLUE SHIELD

## 2016-02-27 ENCOUNTER — Inpatient Hospital Stay (HOSPITAL_COMMUNITY): Payer: Medicare Other

## 2016-02-27 DIAGNOSIS — G8918 Other acute postprocedural pain: Secondary | ICD-10-CM

## 2016-02-27 LAB — PROTIME-INR
INR: 2.61
Prothrombin Time: 28.4 seconds — ABNORMAL HIGH (ref 11.4–15.2)

## 2016-02-27 MED ORDER — OXYCODONE HCL 5 MG PO TABS
5.0000 mg | ORAL_TABLET | ORAL | Status: DC | PRN
  Administered 2016-02-27 – 2016-02-28 (×7): 10 mg via ORAL
  Administered 2016-02-28: 5 mg via ORAL
  Administered 2016-02-29 (×2): 10 mg via ORAL
  Filled 2016-02-27 (×10): qty 2

## 2016-02-27 MED ORDER — WARFARIN SODIUM 5 MG PO TABS
5.0000 mg | ORAL_TABLET | Freq: Once | ORAL | Status: DC
  Administered 2016-02-27 – 2016-02-28 (×2): 5 mg via ORAL
  Filled 2016-02-27: qty 1

## 2016-02-27 NOTE — Progress Notes (Signed)
Social Work Patient ID: Adrian Rivas, male   DOB: 21-Sep-1960, 55 y.o.   MRN: [id number]   Met with pt to discuss team conference goals mod/i level and discharge 10/27. He is doing better and not having as much pain. Portable ramp is being installed today so steps are a non-issue. Discussed follow up needs, he wants to think about and will talk with wife tonight. Will work on discharge needs.

## 2016-02-27 NOTE — Progress Notes (Signed)
Physical Therapy Note  Patient Details  Name: Adrian Rivas MRN: [id number] Date of Birth: April 13, 1961 Today's Date: 02/27/2016  1330-1415, 45 min individual tx Pain: 8/10 L knee' premedicated NWBing LLE; knee brace locked in full extension at all times  W/c propulsion over level tile, carpet with supervision, cues for efficient propulsion.  W/c propulsion outdoors on ramped sidewalk with supervision for safety and technique.  communuty w/;c on /off crowded elevator with supervision, cues for steering to back in, and drive defensively around ambulatory people.  Pt limited by fatigue.  Gait on level tile with RW, min guard and  max cues to prevent toe touch LLE; PT had to stop pt as he continued to attempt TT; with mod cues for hip flexion L to observe NWbing status LLe.  Therapeutic exercise performed with LLE to increase strength for functional mobility: active assistive seated L hip abduction/adducton; active ankle pumps focusing on eversion when DFing. .Instructed pt in diaprhagmatic breathing to assist to anxiety and pain mgt. Pt left resting in w/c with all needs within reach.  Detrell Umscheid 02/27/2016, 12:53 PM

## 2016-02-27 NOTE — Progress Notes (Signed)
Occupational Therapy Session Note  Patient Details  Name: Adrian Rivas MRN: [id number] Date of Birth: 11/30/1960  Today's Date: 02/27/2016 OT Individual Time: 1300-1330 OT Individual Time Calculation (min): 30 min    Short Term Goals: Week 1:  OT Short Term Goal 1 (Week 1): STG=LTG due to short anticipated LOS  Skilled Therapeutic Interventions/Progress Updates:    1:1 OT session focused on sit<>stands, functional mobility, dynamic standing balance, and ADL participation at home. Pt completed stand-step turn transfers with overall CGA.  Sit<>stands w/ supervision. Pt tolerated 5 minutes of standing activity w/ unilateral UE support. Pt/therapist collaboration of home access and safe ADL participation. Pt's spouse to make sure w/c can fit into bathroom to inform OT tomorrow. Pt left seated in w/c with needs met.   Therapy Documentation Precautions:  Precautions Precautions: Fall, Knee Precaution Booklet Issued: No Required Braces or Orthoses: Other Brace/Splint Other Brace/Splint: hinged knee brace LLE Restrictions Weight Bearing Restrictions: Yes LLE Weight Bearing: Non weight bearing Pain: Pain Assessment Pain Assessment: 0-10 Pain Score: 7  Pain Location: Knee Pain Orientation: Left Pain Descriptors / Indicators: Aching Pain Onset: On-going Pain Intervention(s): Repositioned ADL: ADL ADL Comments: see Functional Assessment Tool  See Function Navigator for Current Functional Status.   Therapy/Group: Individual Therapy  Valma Cava 02/27/2016, 4:27 PM

## 2016-02-27 NOTE — Progress Notes (Signed)
ANTICOAGULATION CONSULT NOTE - Follow Up Consult  Pharmacy Consult for coumadin Indication: VTE prophylaxis  Allergies  Allergen Reactions  . No Known Allergies     Patient Measurements: Height: 5\' 6"  (167.6 cm) Weight: 250 lb 7.1 oz (113.6 kg) IBW/kg (Calculated) : 63.8 Heparin Dosing Weight:   Vital Signs: Temp: 98.9 F (37.2 C) (10/25 0530) Temp Source: Oral (10/25 0530) BP: 158/82 (10/25 0530) Pulse Rate: 88 (10/25 0530)  Labs:  Recent Labs  02/25/16 0337 02/26/16 0315 02/27/16 0344  HGB 13.2  --   --   HCT 39.2  --   --   PLT 240  --   --   LABPROT 26.7* 30.2* 28.4*  INR 2.42 2.82 2.61  CREATININE 0.81  --   --     Estimated Creatinine Clearance: 120.6 mL/min (by C-G formula based on SCr of 0.81 mg/dL).   Medications:  Scheduled:  . Brexpiprazole  1 tablet Oral Daily  . desvenlafaxine  100 mg Oral q morning - 10a  . lamoTRIgine  200 mg Oral q morning - 10a  . lisinopril  40 mg Oral Daily  . metoprolol succinate  37.5 mg Oral Daily  . rosuvastatin  20 mg Oral QHS  . tamsulosin  0.4 mg Oral Daily  . Warfarin - Pharmacist Dosing Inpatient   Does not apply q1800   Infusions:    Assessment: 55 yo male s/p ORIF is currently on therapeutic coumadin; INR 2.61 today.  Goal of Therapy:  INR 2-3 Monitor platelets by anticoagulation protocol: Yes   Plan:  - coumadin 5 mg po daily - INR in am  Bayard HuggerMei Yaiza Palazzola, PharmD, BCPS  Clinical Pharmacist  Pager: [phone]   02/27/2016,1:35 PM

## 2016-02-27 NOTE — Progress Notes (Addendum)
[facility] MEDICINE & REHABILITATION     PROGRESS NOTE  Subjective/Complaints:  Pt laying in bed this AM.  He slept well overnight.  He states his pain is controlled.    ROS: Denies CP, SOB, N/V/D.  Objective: Vital Signs: Blood pressure (!) 158/82, pulse 88, temperature 98.9 F (37.2 C), temperature source Oral, resp. rate 18, height 5\' 6"  (1.676 m), weight 112.5 kg (248 lb 1.6 oz), SpO2 96 %. No results found.  Recent Labs  02/25/16 0337  WBC 9.1  HGB 13.2  HCT 39.2  PLT 240    Recent Labs  02/25/16 0337  NA 141  K 3.3*  CL 105  GLUCOSE 107*  BUN 8  CREATININE 0.81  CALCIUM 8.9   CBG (last 3)  No results for input(s): GLUCAP in the last 72 hours.  Wt Readings from Last 3 Encounters:  02/23/16 112.5 kg (248 lb 1.6 oz)  02/15/16 113.4 kg (250 lb)  10/20/12 105.2 kg (232 lb)    Physical Exam:  BP (!) 158/82 (BP Location: Left Arm)   Pulse 88   Temp 98.9 F (37.2 C) (Oral)   Resp 18   Ht 5\' 6"  (1.676 m)   Wt 112.5 kg (248 lb 1.6 oz)   SpO2 96%   BMI 40.04 kg/m  Constitutional: Well-developed. Obese. NAD. HENT: Normocephalic. Atraumatic. Eyes: EOMare normal. No discharge.  Cardiovascular: Normal rateand regular rhythm.  Respiratory: Effort normaland breath sounds normal. No respiratory distress.  GI: Soft. Bowel sounds are normal. He exhibits no distension.  Musc: Hinged knee brace LLE Neurological: He is alertand oriented.  Motor: B/l UE, RLE: 5/5 LLE: Hip flexion 3/5, ankle dorsi/plantar flexion 4+/5 Skin: Incision clean and dry.   Assessment/Plan: 1. Functional deficits secondary to left tibial plateau fracture status post ORIF which require 3+ hours per day of interdisciplinary therapy in a comprehensive inpatient rehab setting. Physiatrist is providing close team supervision and 24 hour management of active medical problems listed below. Physiatrist and rehab team continue to assess barriers to discharge/monitor patient progress  toward functional and medical goals.  Function:  Bathing Bathing position   Position: Wheelchair/chair at sink  Bathing parts Body parts bathed by patient: Right arm, Left arm, Chest, Abdomen, Front perineal area, Buttocks, Right upper leg, Right lower leg Body parts bathed by helper: Back, Buttocks  Bathing assist Assist Level: Touching or steadying assistance(Pt > 75%)      Upper Body Dressing/Undressing Upper body dressing   What is the patient wearing?: Pull over shirt/dress     Pull over shirt/dress - Perfomed by patient: Thread/unthread right sleeve, Thread/unthread left sleeve, Put head through opening Pull over shirt/dress - Perfomed by helper: Pull shirt over trunk        Upper body assist Assist Level: Set up   Set up : To obtain clothing/put away  Lower Body Dressing/Undressing Lower body dressing   What is the patient wearing?: Non-skid slipper socks, Socks, Shoes, Pants     Pants- Performed by patient: Pull pants up/down Pants- Performed by helper: Thread/unthread left pants leg, Thread/unthread right pants leg Non-skid slipper socks- Performed by patient: Don/doff right sock Non-skid slipper socks- Performed by helper: Don/doff left sock Socks - Performed by patient: Don/doff right sock   Shoes - Performed by patient: Don/doff right shoe Shoes - Performed by helper: Fasten right       TED Hose - Performed by helper: Don/doff right TED hose  Lower body assist Assist for lower body dressing: Touching  or steadying assistance (Pt > 75%)      Toileting Toileting   Toileting steps completed by patient: Adjust clothing prior to toileting, Performs perineal hygiene, Adjust clothing after toileting Toileting steps completed by helper: Adjust clothing prior to toileting, Performs perineal hygiene, Adjust clothing after toileting    Toileting assist Assist level: Touching or steadying assistance (Pt.75%) (per Lelon Mast, NT)   Transfers Chair/bed transfer    Chair/bed transfer method: Stand pivot, Ambulatory Chair/bed transfer assist level: Supervision or verbal cues Chair/bed transfer assistive device: Armrests, Patent attorney     Max distance: 25 ft Assist level: Supervision or verbal cues   Wheelchair   Type: Manual Max wheelchair distance: 150 ft Assist Level: Supervision or verbal cues  Cognition Comprehension Comprehension assist level: Follows basic conversation/direction with no assist  Expression Expression assist level: Expresses basic needs/ideas: With no assist  Social Interaction Social Interaction assist level: Interacts appropriately with others - No medications needed.  Problem Solving Problem solving assist level: Solves basic problems with no assist  Memory Memory assist level: Recognizes or recalls 90% of the time/requires cueing < 10% of the time    Medical Problem List and Plan: 1.  Decreased functional mobility secondary to left tibial plateau fracture status post ORIF 02/19/2016.   Non-weightbearing  Cont CIR 2.  DVT Prophylaxis/Anticoagulation:   Coumadin per pharmacy protocol, INR therapeutic  Monitor for any bleeding episodes.   Vascular study negative 10/22 3. Pain Management: Oxycodone and Robaxin as needed. Monitor with increased mobility  Weaning meds as tolerated 4. Mood. Lamictal 200 mg daily, Pristiq 100 mg daily,Brexipiprazole 1 mg daily. 5. Neuropsych: This patient is capable of making decisions on his own behalf. 6. Skin/Wound Care: Routine skin checks 7. Fluids/Electrolytes/Nutrition: Routine I&Os 8. Hypertension.   Lisinopril 40 mg daily  Toprol 25 mg daily, increased to 37.5 on 10/23.    Slightly labile, will cont to monitor, will consider further increase in meds if necessary 9. BPH. Flomax 0.4 mg daily. PVRs 3 remain pending 10. Hyperlipidemia. Crestor 11. Hypokalemia:   K 3.3 on 10/23  Supplemented  Will cont to monitor 12. Insomnia  Home Ambien 10mg  started on  10/22  LOS (Days) 4 A FACE TO FACE EVALUATION WAS PERFORMED  Aynsley Fleet Karis Juba 02/27/2016 8:17 AM

## 2016-02-27 NOTE — Op Note (Signed)
NAMRonne Rivas:  Nick, Lyell                ACCOUNT NO.:  [id number]  MEDICAL RECORD NO.:  [id number]  LOCATION:  5N08C                        FACILITY:  [facility]  PHYSICIAN:  Doralee AlbinoMichael H. Carola FrostHandy, M.D. DATE OF BIRTH:  [birth year]  DATE OF PROCEDURE:  02/19/2016 DATE OF DISCHARGE:  02/23/2016                              OPERATIVE REPORT   PREOPERATIVE DIAGNOSIS:  Left bicondylar tibial plateau fracture.  POSTOPERATIVE DIAGNOSIS:  Left bicondylar tibial plateau fracture.  PROCEDURE: 1. Open reduction and internal fixation of left bicondylar tibial     plateau. 2. Anterior compartment fasciotomy.  SURGEON:  Doralee AlbinoMichael H. Carola FrostHandy, M.D.  ASSISTANT:  Hart CarwinJustin Queen, RNFA.  ANESTHESIA:  General.  COMPLICATIONS:  None.  TOURNIQUET:  None.  DISPOSITION:  To PACU.  CONDITION:  Stable.  BRIEF SUMMARY OF INDICATIONS FOR PROCEDURE:  Adrian Binningimothy Adrian Rivas is a 55 year old who sustained a ground level fall resulting in a displaced bicondylar plateau with extension down to the metadiaphysis.  There is some medial and slight posterior displacement.  The patient was concerning initially for compartment syndrome, but has been watched visually to ensure that this did not occur with ice, elevation and frequent movement of the toes.  He has been on DVT prophylaxis awaiting surgery.  I did discuss with him the risks and benefits of surgical repair including the potential for compartment syndrome, nerve injury, vessel injury, DVT, PE, heart attack, stroke, loss of motion, need for further surgery among others.  The patient acknowledged these risks and strongly wished to proceed.  BRIEF SUMMARY OF PROCEDURE:  The patient was taken to the operating room after administration of preop antibiotics.  Anesthesia was induced.  His left lower extremity was prepped and draped in usual sterile fashion.  I began with a medial 5-cm incision centered about the fracture displacement.  I went down to the bone and identified the  fracture site. At this point, I had my assistant pull traction, extension and slight valgus, and I was able to mobilize the fracture up into a reduced position with the assistance of a ball-spike pusher and sequential tightening of a screw on a 3.5 limited Dynamic Compression plate buttressing this fracture.  Three additional screws were placed in this manner to firmly secure this in a reduced position with excellent compression to resist displacement.  Next, attention was turned to the plateau.  Here a curvilinear lateral incision was made.  Dissection was taken down through the soft tissues to the edge and the origin of the extensors released just enough to allow for placement of the plate. Here my assistant pulled straight traction, I was able to elevate the lateral surface up into a reduced position placing multiple K-wires provisionally.  These were also used as joysticks.  I then used a [employer] clamp introduced through a small stab incision medially and compressed the articular surface, placing standard screws in a far anterior and posterior holes of the plateau and then additional lock screw fixation.  Additional screws were placed into the metaphysis resulting in excellent reduction and stability of the fracture.  Final C- arm images on AP and lateral should confirm this.  Wound was irrigated. I then turned my attention  distally.  Long Metzenbaum scissors were spread both superficial and deep to the anterior compartment fascia with my assistant pulling up on the skin and subcutaneous tissue.  With the scissors, I was able to release the anterior compartment for 10 cm distally.  This was performed to reduce the risk of postoperative compartment syndrome.  A standard layered closure was performed with 0 Vicryl, 2-0 Vicryl and 3-0 nylon.  Sterile gently compressive dressing was applied.  The patient was taken to PACU in stable condition.  PROGNOSIS:  The patient will have  unrestricted range of motion of the left knee and ankle.  We plan to see him back in the office for re- evaluation 10 days after discharge.  Short stay in rehab center or skilled nursing is not anticipated.     Doralee Albino. Carola Frost, M.D.     [facility]/MEDQ  D:  02/26/2016  T:  02/27/2016  Job:  [id number]

## 2016-02-27 NOTE — Progress Notes (Deleted)
Please see previous note: Pt found to have left distal vein thrombosis.  Will treat and avoid PROM of LLE today.

## 2016-02-27 NOTE — Patient Care Conference (Signed)
Inpatient RehabilitationTeam Conference and Plan of Care Update Date: 02/27/2016   Time: 2:10 PM    Patient Name: Adrian Rivas      Medical Record Number: [id number]  Date of Birth: 1960/05/20 Sex: Male         Room/Bed: 4M04C/4M04C-01 Payor Info: Payor: BLUE CROSS BLUE SHIELD / Plan: BCBS OTHER / Product Type: *No Product type* /    Admitting Diagnosis: TIB Plateau  Admit Date/Time:  02/23/2016  3:50 PM Admission Comments: No comment available   Primary Diagnosis:  <principal problem not specified> Principal Problem: <principal problem not specified>  Patient Active Problem List   Diagnosis Date Noted  . Closed fracture of left tibial plateau   . Subtherapeutic international normalized ratio (INR)   . Benign essential HTN   . Benign prostatic hyperplasia   . Primary insomnia   . Hip fracture (HCC) 02/23/2016  . Tibial plateau fracture, left   . Post-operative pain   . Hypokalemia   . Benign prostatic hyperplasia without lower urinary tract symptoms   . Hyperlipidemia   . Abnormality of gait   . COPD exacerbation (HCC)   . Closed tibia fracture 02/15/2016  . Closed bicondylar fracture of left tibial plateau 02/15/2016  . HTN (hypertension)   . Spinal stenosis of lumbar region 10/20/2012  . PULMONARY NODULE 11/10/2007  . CHEST PAIN 11/10/2007    Expected Discharge Date: Expected Discharge Date: 02/29/16  Team Members Present: Physician leading conference: Dr. Maryla MorrowAnkit Patel Social Worker Present: [name] Denzil Mceachron, LCSW Nurse Present: Other (comment) Arlean Hopping(jennifer Bailey-RN) PT Present: Bayard Huggerebecca Varner, PT OT Present: Other (comment) Gentry Fitz(Elisabeth Doe-OT) SLP Present: Jackalyn LombardNicole Page, SLP PPS Coordinator present : Tora DuckMarie Noel, RN, CRRN     Current Status/Progress Goal Weekly Team Focus  Medical    Decreased functional mobility secondary to left tibial plateau fracture status post ORIF 02/19/2016.  Improve mobility, transfers, pain  See above   Bowel/Bladder   continent of bowel  and bladder; LBM 10/23  pt to remain continent of bowel and bladder  continue to monitor   Swallow/Nutrition/ Hydration             ADL's   Mod A  Mod I  ADL AE, bathing/dressing adaptations, sit<>stand, functional transfers   Mobility   supervision-min A  mod I household ambulator  functional mobility, strengthening, standing balance, maintaining precautions, activity tolerance, pt/fam education, DC planning   Communication             Safety/Cognition/ Behavioral Observations            Pain   oxycodone 10mg  Q4HR PRN  3  monitor for nonverbal cues   Skin   L hip incision with drsg CDI; right antecubital abrasion  pt wil be free of skin breakdown and infection  asses skin Qshift      *See Care Plan and progress notes for long and short-term goals.  Barriers to Discharge: Mobility, safety, transfers, pain, HTN, hypokalemia, BPH    Possible Resolutions to Barriers:  Therapies, wean narcotics, optimize BP meds, supplementing K+, PVRs    Discharge Planning/Teaching Needs:  Home with wife and step-son, wife works during the day. Gave wheelchair ramp rented information to pt, to pursue regarding stair issue      Team Discussion:  Goals of mod/i level. Ramp beng installed today so steps are a non-issue. PVR ordered per MD-RN to follow up with this. Pt asking if going home on coumadin-MD to check with surgeon. Will be medically ready Friday  Revisions to Treatment Plan:  DC Friday   Continued Need for Acute Rehabilitation Level of Care: The patient requires daily medical management by a physician with specialized training in physical medicine and rehabilitation for the following conditions: Daily direction of a multidisciplinary physical rehabilitation program to ensure safe treatment while eliciting the highest outcome that is of practical value to the patient.: Yes Daily medical management of patient stability for increased activity during participation in an intensive  rehabilitation regime.: Yes Daily analysis of laboratory values and/or radiology reports with any subsequent need for medication adjustment of medical intervention for : Post surgical problems;Blood pressure problems;Urological problems;Other  Adrian Rivas, Adrian Rivas 02/27/2016, 4:02 PM

## 2016-02-27 NOTE — Progress Notes (Signed)
Occupational Therapy Session Note  Patient Details  Name: Adrian Rivas MRN: [id number] Date of Birth: 24-Mar-1961  Today's Date: 02/27/2016 OT Individual Time: 1100-1155 OT Individual Time Calculation (min): 55 min     Short Term Goals: Week 1:  OT Short Term Goal 1 (Week 1): STG=LTG due to short anticipated LOS  Skilled Therapeutic Interventions/Progress Updates:    Pt resting in bed upon arrival with wife present.  Pt engaged in BADL retraining including bathing/dressing with sit<>stand from w/c at sink.  Pt elected to not change pants but did stand at sink to complete pericare while standing.  Pt completed bathing tasks with steady A while standing.  Pt donned shirt without assistance.  Discussed DME needs (none identified) and AE use.  Pt's wife that they have AE from previous back surgeries.  Educated pt/wife on home safety.  Pt requested to return to bed and rest before next therapy session after lunch.  Encouraged pt to remain in w/c but he stated that w/c was uncomfortable secondary to back pain.  Pt performed all transfers at supervision level.  Focus on BADL retaining, sit<>stand, functional transfers, education, and safety awareness to increase independence with BADLs.  Therapy Documentation Precautions:  Precautions Precautions: Fall, Knee Precaution Booklet Issued: No Required Braces or Orthoses: Other Brace/Splint Other Brace/Splint: hinged knee brace LLE Restrictions Weight Bearing Restrictions: Yes LLE Weight Bearing: Non weight bearing General:   Vital Signs:  Pain: Pain Assessment Pain Assessment: 0-10 Pain Score: 7  Pain Type: Acute pain Pain Location: Knee Pain Orientation: Left Pain Descriptors / Indicators: Aching Pain Onset: On-going Pain Intervention(s): Repositioned;Rest ADL: ADL ADL Comments: see Functional Assessment Tool Exercises:   Other Treatments:    See Function Navigator for Current Functional Status.   Therapy/Group: Individual  Therapy  Rich BraveLanier, Magin Balbi [name] 02/27/2016, 12:25 PM

## 2016-02-27 NOTE — Progress Notes (Signed)
Physical Therapy Session Note  Patient Details  Name: Adrian Rivas MRN: [id number] Date of Birth: [birth year]  Today's Date: 02/27/2016 PT Individual Time: 0905-1000 PT Individual Time Calculation (min): 55 min    Short Term Goals: Week 1:  PT Short Term Goal 1 (Week 1): = LTGs due to anticipated LOS  Skilled Therapeutic Interventions/Progress Updates:    Patient in bed upon arrival with wife present for family education with focus on bed mobility, transfers using RW, gait up to 25 ft using RW in controlled environment, wheelchair propulsion in controlled environment with leg rest management, car transfer to simulate patient's car seat heights of 20" and 28" using RW with initial demonstration and increased safety from 28" height, and propelling wheelchair up/down ramp using BUE with initial min A to clear threshold with supervision overall. In ADL apartment, patient performed ambulatory transfer using RW in home environment and bed mobility on regular bed raised to height of 29"  (close to home environment of 32-33") with supervision. Discussed plan for temporary ramp to be installed this afternoon, DME needs (pt owns RW and manual wheelchair with ELR), and f/u HHPT. Patient required rest breaks due to pain and shortness of breath throughout session. Patient left semi reclined in bed due to increased back pain with wife in room and needs in reach.   Therapy Documentation Precautions:  Precautions Precautions: Fall, Knee Precaution Booklet Issued: No Required Braces or Orthoses: Other Brace/Splint Other Brace/Splint: hinged knee brace LLE Restrictions Weight Bearing Restrictions: Yes LLE Weight Bearing: Non weight bearing  Pain: Pain Assessment Pain Assessment: 0-10 Pain Score: 7  Pain Type: Acute pain Pain Location: Knee Pain Orientation: Left Pain Descriptors / Indicators: Aching Pain Onset: On-going Pain Intervention(s): Repositioned;Rest   See Function Navigator for  Current Functional Status.   Therapy/Group: Individual Therapy  Kerney ElbeVarner, Tyarra Nolton A 02/27/2016, 10:29 AM

## 2016-02-28 ENCOUNTER — Inpatient Hospital Stay (HOSPITAL_COMMUNITY): Payer: Medicare Other | Admitting: Occupational Therapy

## 2016-02-28 ENCOUNTER — Inpatient Hospital Stay (HOSPITAL_COMMUNITY): Payer: BLUE CROSS/BLUE SHIELD | Admitting: Physical Therapy

## 2016-02-28 ENCOUNTER — Inpatient Hospital Stay (HOSPITAL_COMMUNITY): Payer: Medicare Other | Admitting: Physical Therapy

## 2016-02-28 DIAGNOSIS — S8290XS Unspecified fracture of unspecified lower leg, sequela: Secondary | ICD-10-CM

## 2016-02-28 LAB — PROTIME-INR
INR: 2.9
Prothrombin Time: 31 seconds — ABNORMAL HIGH (ref 11.4–15.2)

## 2016-02-28 NOTE — Progress Notes (Signed)
ANTICOAGULATION CONSULT NOTE - Follow Up Consult  Pharmacy Consult for coumadin Indication: VTE prophylaxis  Allergies  Allergen Reactions  . No Known Allergies     Patient Measurements: Height: 5\' 6"  (167.6 cm) Weight: 250 lb 7.1 oz (113.6 kg) IBW/kg (Calculated) : 63.8 Heparin Dosing Weight:   Vital Signs: Temp: 98.5 F (36.9 C) (10/26 0552) Temp Source: Oral (10/26 0552) BP: 138/74 (10/26 0552) Pulse Rate: 94 (10/26 0552)  Labs:  Recent Labs  02/26/16 0315 02/27/16 0344 02/28/16 0639  LABPROT 30.2* 28.4* 31.0*  INR 2.82 2.61 2.90    Estimated Creatinine Clearance: 120.6 mL/min (by C-G formula based on SCr of 0.81 mg/dL).   Medications:  Scheduled:  . Brexpiprazole  1 tablet Oral Daily  . desvenlafaxine  100 mg Oral q morning - 10a  . lamoTRIgine  200 mg Oral q morning - 10a  . lisinopril  40 mg Oral Daily  . metoprolol succinate  37.5 mg Oral Daily  . rosuvastatin  20 mg Oral QHS  . tamsulosin  0.4 mg Oral Daily  . warfarin  5 mg Oral ONCE-1800  . Warfarin - Pharmacist Dosing Inpatient   Does not apply q1800   Infusions:    Assessment: 55 yo male s/p ORIF is currently on therapeutic coumadin; INR 2.9 today.  Goal of Therapy:  INR 2-3 Monitor platelets by anticoagulation protocol: Yes   Plan:  - coumadin 5 mg po daily - INR in am  Adrian Rivas, PharmD, BCPS  Clinical Pharmacist  Pager: [phone]   02/28/2016,11:48 AM

## 2016-02-28 NOTE — Plan of Care (Signed)
Problem: RH Wheelchair Mobility Goal: LTG Patient will propel w/c in community environment (PT) LTG: Patient will propel wheelchair in community environment, # of feet with assist (PT)  Outcome: Adequate for Discharge Patient requires supervision for community wheelchair mobility.

## 2016-02-28 NOTE — Progress Notes (Addendum)
[name] PHYSICAL MEDICINE & REHABILITATION     PROGRESS NOTE  Subjective/Complaints:  Pt seen laying in bed this AM.  He slept well overnight. HE has questions about INR on discharge.      ROS: Denies CP, SOB, N/V/D.  Objective: Vital Signs: Blood pressure 138/74, pulse 94, temperature 98.5 F (36.9 C), temperature source Oral, resp. rate 20, height 5\' 6"  (1.676 m), weight 113.6 kg (250 lb 7.1 oz), SpO2 93 %. No results found. No results for input(s): WBC, HGB, HCT, PLT in the last 72 hours. No results for input(s): NA, K, CL, GLUCOSE, BUN, CREATININE, CALCIUM in the last 72 hours.  Invalid input(s): CO CBG (last 3)  No results for input(s): GLUCAP in the last 72 hours.  Wt Readings from Last 3 Encounters:  02/27/16 113.6 kg (250 lb 7.1 oz)  02/15/16 113.4 kg (250 lb)  10/20/12 105.2 kg (232 lb)    Physical Exam:  BP 138/74 (BP Location: Right Arm)   Pulse 94   Temp 98.5 F (36.9 C) (Oral)   Resp 20   Ht 5\' 6"  (1.676 m)   Wt 113.6 kg (250 lb 7.1 oz)   SpO2 93%   BMI 40.42 kg/m  Constitutional: Well-developed. Obese. NAD. HENT: Normocephalic. Atraumatic. Eyes: EOMare normal. No discharge.  Cardiovascular: RRR. No JVD. Respiratory: Effort normaland breath sounds normal. No respiratory distress.  GI: Soft. Bowel sounds are normal. He exhibits no distension.  Musc: Hinged knee brace LLE Neurological: He is alertand oriented.  Motor: B/l UE, RLE: 5/5 LLE: Hip flexion 3/5, ankle dorsi/plantar flexion 4+/5 Skin: Incision with dressing clean and dry.   Assessment/Plan: 1. Functional deficits secondary to left tibial plateau fracture status post ORIF which require 3+ hours per day of interdisciplinary therapy in a comprehensive inpatient rehab setting. Physiatrist is providing close team supervision and 24 hour management of active medical problems listed below. Physiatrist and rehab team continue to assess barriers to discharge/monitor patient progress toward  functional and medical goals.  Function:  Bathing Bathing position   Position: Wheelchair/chair at sink  Bathing parts Body parts bathed by patient: Right arm, Left arm, Chest, Abdomen, Front perineal area, Buttocks Body parts bathed by helper: Back, Buttocks  Bathing assist Assist Level: Touching or steadying assistance(Pt > 75%)      Upper Body Dressing/Undressing Upper body dressing   What is the patient wearing?: Pull over shirt/dress     Pull over shirt/dress - Perfomed by patient: Thread/unthread right sleeve, Thread/unthread left sleeve, Put head through opening, Pull shirt over trunk Pull over shirt/dress - Perfomed by helper: Pull shirt over trunk        Upper body assist Assist Level: Set up   Set up : To obtain clothing/put away  Lower Body Dressing/Undressing Lower body dressing Lower body dressing/undressing activity did not occur: Refused What is the patient wearing?: Non-skid slipper socks, Socks, Shoes, Pants     Pants- Performed by patient: Pull pants up/down Pants- Performed by helper: Thread/unthread left pants leg, Thread/unthread right pants leg Non-skid slipper socks- Performed by patient: Don/doff right sock Non-skid slipper socks- Performed by helper: Don/doff left sock Socks - Performed by patient: Don/doff right sock   Shoes - Performed by patient: Don/doff right shoe Shoes - Performed by helper: Fasten right       TED Hose - Performed by helper: Don/doff right TED hose  Lower body assist Assist for lower body dressing: Touching or steadying assistance (Pt > 75%)  Toileting Toileting   Toileting steps completed by patient: Adjust clothing prior to toileting, Performs perineal hygiene, Adjust clothing after toileting Toileting steps completed by helper: Adjust clothing prior to toileting, Performs perineal hygiene, Adjust clothing after toileting    Toileting assist Assist level: Touching or steadying assistance (Pt.75%) (per Lelon Mast, NT)   Transfers Chair/bed transfer   Chair/bed transfer method: Stand pivot, Ambulatory Chair/bed transfer assist level: Supervision or verbal cues Chair/bed transfer assistive device: Armrests, Patent attorney     Max distance: 25 Assist level: Touching or steadying assistance (Pt > 75%)   Wheelchair   Type: Manual Max wheelchair distance: 200 Assist Level: Supervision or verbal cues  Cognition Comprehension Comprehension assist level: Follows complex conversation/direction with extra time/assistive device  Expression Expression assist level: Expresses complex ideas: With extra time/assistive device  Social Interaction Social Interaction assist level: Interacts appropriately with others with medication or extra time (anti-anxiety, antidepressant).  Problem Solving Problem solving assist level: Solves complex 90% of the time/cues < 10% of the time  Memory Memory assist level: Recognizes or recalls 75 - 89% of the time/requires cueing 10 - 24% of the time    Medical Problem List and Plan: 1.  Decreased functional mobility secondary to left tibial plateau fracture status post ORIF 02/19/2016.   Non-weightbearing  Cont CIR 2.  DVT Prophylaxis/Anticoagulation:   Coumadin per pharmacy protocol, INR therapeutic  Monitor for any bleeding episodes.   Vascular study negative 10/22 3. Pain Management: Oxycodone and Robaxin as needed. Monitor with increased mobility  Weaning meds as tolerated 4. Mood. Lamictal 200 mg daily, Pristiq 100 mg daily,Brexipiprazole 1 mg daily. 5. Neuropsych: This patient is capable of making decisions on his own behalf. 6. Skin/Wound Care: Routine skin checks 7. Fluids/Electrolytes/Nutrition: Routine I&Os 8. Hypertension.   Lisinopril 40 mg daily  Toprol 25 mg daily, increased to 37.5 on 10/23.    Controlled at present  9. BPH. Flomax 0.4 mg daily. PVRs 3 checked and discussed with nursing WNL 10. Hyperlipidemia. Crestor 11.  Hypokalemia:   K 3.3 on 10/23  Supplemented  Will cont to monitor  Labs ordered for tomorrow 12. Insomnia  Home Ambien 10mg  started on 10/22 13. Morbid obesity  Body mass index is 40.42 kg/m.  Diet and exercise education  Will cont to encourage weight loss to increase endurance and promote overall health  LOS (Days) 5 A FACE TO FACE EVALUATION WAS PERFORMED  Adrian Rivas 02/28/2016 8:22 AM

## 2016-02-28 NOTE — Discharge Summary (Signed)
NAMEAQEEL, NORGAARD                ACCOUNT NO.:  [id number]  MEDICAL RECORD NO.:  [id number]  LOCATION:  4M04C                        FACILITY:  [facility]  PHYSICIAN:  Maryla Morrow, MD        DATE OF BIRTH:  1960/08/24  DATE OF ADMISSION:  02/23/2016 DATE OF DISCHARGE:  02/29/2016                              DISCHARGE SUMMARY   DISCHARGE DIAGNOSES: 1. Left tibial plateau fracture status post ORIF on February 19, 2016. 2. Coumadin for DVT prophylaxis. 3. Pain management. 4. Depression with anxiety. 5. Hypertension. 6. Benign prostatic hypertrophy. 7. Hypokalemia, resolved. 8. Insomnia.  HISTORY OF PRESENT ILLNESS:  This is a 55 year old right-handed male, history of COPD, chronic neck pain with history of anterior fusion as well as multiple low back surgeries.  Lives with spouse, independent prior to admission, on disability for multiple back surgeries.  Wife works during the day.  One level home, 3 steps to entry.  Presented to [facility] on February 15, 2016, after a fall while going to the bathroom.  Denied loss of consciousness.  X-rays and imaging revealed left tibial plateau fracture.  He was transferred to [facility] and underwent ORIF with anterior compartment fasciotomy on February 19, 2016, per Dr. Carola Frost.  Nonweightbearing left lower extremity with hinged knee brace.  Hospital course, pain management.  Subcutaneous Lovenox for DVT prophylaxis, transitioned to Coumadin.  Physical and occupational therapy ongoing.  The patient was admitted for comprehensive rehab program.  PAST MEDICAL HISTORY:  See discharge diagnoses.  SOCIAL HISTORY:  Lives with wife.  Independent prior to admission. Functional status upon admission to rehab services:  Minimal assist, +2 physical assist, ambulate 4 feet with rolling walker; +2 physical assist, sit to stand; min to mod assist, activities of daily living.  PHYSICAL EXAMINATION:  VITAL SIGNS:  Blood pressure 159/82, pulse  89, temperature 98, respirations 17. GENERAL:  This was an alert male, in no acute distress, oriented x3. LUNGS:  Clear to auscultation without wheeze. CARDIAC:  Regular rate and rhythm without murmur. ABDOMEN:  Soft, nontender.  Good bowel sounds. Left hip incision clean and dry.  REHABILITATION/HOSPITAL COURSE:  The patient was admitted to inpatient rehab services with therapies initiated on a 3-hour daily basis, consisting of physical therapy, occupational therapy, and rehabilitation nursing.  The following issues were addressed during the patient's rehabilitation stay.  Pertaining to Mr. Fluegel's left tibial plateau fracture, he had undergone ORIF on February 19, 2016, neurovascular sensation intact, nonweightbearing with hinged knee brace, he would follow up with Dr. Carola Frost of [facility].  The patient remained on Coumadin for DVT prophylaxis through March 21, 2016, latest INR of 2.61, a home health nurse had been arranged for blood thinning times to be called to his primary care provider, venous Doppler studies negative. Pain management with the use of oxycodone and Robaxin as needed with good results.  He did have a history of depression and anxiety, he continued on Lamictal, Pristiq as well as Brexpiprazole.  He was attending full therapies.  Blood pressures controlled and monitored with Toprol and lisinopril.  Toprol was increased to 37.5 mg on February 25, 2016, due to some hypertension, voiding  without difficulty, he remained on Flomax.  Bouts of hypokalemia resolved with potassium supplement.  He was using Ambien as needed at nighttime for insomnia as prior to admission.  The patient received weekly collaborative interdisciplinary team conferences to discuss estimated length of stay, family teaching, any barriers to discharge.  Wheelchair propulsion over level tile carpet supervision, cues for efficient propulsion.  Wheelchair propulsion outdoors on ramp sidewalk with  supervision for safety and technique. Ambulating on level tile with rolling walker, minimal guard, working with energy conservation techniques, maintaining nonweightbearing status.  Gathering belongings for activities of daily living and homemaking, working with dynamic standing balance, completed stand step turn transfers with overall contact guard.  Full family teaching was completed and plan discharge to home.  DISCHARGE MEDICATIONS: 1. Brexpiprazole 1 mg p.o. daily. 2. Pristiq 100 mg p.o. daily. 3. Lamictal 200 mg daily. 4. Lisinopril 40 mg p.o. daily. 5. Toprol-XL 37.5 mg p.o. daily. 6. Crestor 20 mg p.o. at bedtime. 7. Flomax 0.4 mg daily. 8. Coumadin 5 mg was latest dose, adjusted accordingly for INR of 2.0-     3.0.  The patient would remain on Coumadin through March 21, 2016, for DVT prophylaxis. 9. Robaxin 500 mg p.o. every 6 hours as needed muscle spasms. 10.Oxycodone immediate release 10 mg p.o. every 4 hours as needed     pain.  DIET:  Regular.  PT 31 INR 2.90 on 02/27/2016  The patient would follow up Dr. Maryla MorrowAnkit Patel at the outpatient rehab service office one month; Dr. Doreen BeamMichel Handy, 2 weeks call for appointment; Dr. Lacie ScottsNiemeyer, medical management.  Nonweightbearing on left lower extremity with hinged knee brace.  A home health nurse was ordered to check INR on March 05, 2016, results to PCP Dr Lacie ScottsNiemeyer [phone] fax number [phone].  The patient remained on Coumadin through March 21, 2016.     Mariam Dollaraniel Angiulli, P.A.   ______________________________ Maryla MorrowAnkit Patel, MD    DA/MEDQ  D:  02/28/2016  T:  02/28/2016  Job:  [id number]  cc:   Evelene CroonMeindert Niemeyer, MD

## 2016-02-28 NOTE — Progress Notes (Signed)
Occupational Therapy Discharge Summary  Patient Details  Name: Adrian Rivas MRN: [id number] Date of Birth: 02/10/1961  Today's Date: 02/28/2016  Session 1 OT Individual Time: 0800-0900 OT Individual Time Calculation (min): 60 min   Session 2 OT Individual Time: 1300-1345 OT Individual Time Calculation (min): 45 min   Patient has met 8 of 8 long term goals due to improved activity tolerance, improved balance and ability to compensate for deficits.  Patient to discharge at overall Modified Independent level.  Patient's care partner is independent to provide the necessary physical assistance at discharge.    Reasons goals not met: n/a  Recommendation:  Patient will benefit from ongoing skilled OT services in home health setting to continue to advance functional skills in the area of BADL.  Equipment: Pt has all needed equipment   Reasons for discharge: treatment goals met and discharge from hospital  Patient/family agrees with progress made and goals achieved: Yes  Skilled therapeutic Interventions Session 1 1:1 OT session focused on increased independence with bathing/dressing using ADL AE. Pt completed bathing tasks and the sink with overall Mod I using long-handled sponge. Pt utilized ADL AE appropriately to complete LB dressing at Mod I level. Pt left seated in w/c at end of session with needs met.   Session 2 OT session focused on toilet and tub/shower transfer. OT discussed technique in simulated home environment. Pt able to ambulate short distance and transfer on/off toilet w/ raised BSC overtop w/ mod I. Tub/shower transfer completed with OT demonstration for technique. Pt returned to room at end of session and left with needs met.    OT Discharge Precautions/Restrictions Precautions Precautions: Knee Required Braces or Orthoses: Other Brace/Splint Other Brace/Splint: hinged knee brace locked in extension LLE Restrictions Weight Bearing Restrictions: Yes LLE Weight  Bearing: Non weight bearing  Pain Pain Assessment Pain Assessment: 0-10 Pain Score: 7  Pain Type: Acute pain Pain Location: Knee Pain Orientation: Left Pain Descriptors / Indicators: Aching Pain Onset: On-going Pain Intervention(s): Repositioned  ADL ADL Lower Body Bathing: Minimal assistance Upper Body Dressing: Independent Lower Body Dressing: Modified independent Toilet Transfer: Modified independent Tub/Shower Transfer: Close supervison Tub/Shower Equipment: Transfer tub bench ADL Comments: See functional Navigator  Cognition Overall Cognitive Status: Within Functional Limits for tasks assessed Arousal/Alertness: Awake/alert Orientation Level: Oriented X4 Sensation Sensation Light Touch: Appears Intact Stereognosis: Appears Intact Hot/Cold: Appears Intact Proprioception: Appears Intact Coordination Gross Motor Movements are Fluid and Coordinated: No Motor  Motor Motor: Within Functional Limits Balance Dynamic Standing Balance Dynamic Standing - Balance Support: During functional activity Dynamic Standing - Level of Assistance: 6: Modified independent (Device/Increase time) Extremity/Trunk Assessment RUE Assessment RUE Assessment: Within Functional Limits LUE Assessment LUE Assessment: Within Functional Limits  See Function Navigator for Current Functional Status.   S  02/28/2016, 4:13 PM   

## 2016-02-28 NOTE — Discharge Summary (Signed)
Discharge summary job [id number]

## 2016-02-28 NOTE — Progress Notes (Signed)
Physical Therapy Session Note  Patient Details  Name: Adrian Rivas MRN: [id number] Date of Birth: 15-Apr-1961  Today's Date: 02/28/2016 PT Individual Time: 1500-1530 PT Individual Time Calculation (min): 30 min    Short Term Goals: Week 1:  PT Short Term Goal 1 (Week 1): = LTGs due to anticipated LOS  Skilled Therapeutic Interventions/Progress Updates:    Pt resting in bed on arrival, c/o 8/10 pain but reports being premedicated, agreeable to therapy session.  Pt propelled w/c to and from ortho gym and up/down ramp mod I.  Pt ambulated up/down ramp with RW and close supervision with min verbal cues for NWB on LLE.  PT returned to room at end of session and positioned self back in bed mod I.  Call bell in reach and needs met.   Therapy Documentation Precautions:  Precautions Precautions: Knee Precaution Booklet Issued: No Required Braces or Orthoses: Other Brace/Splint Other Brace/Splint: hinged knee brace locked in extension LLE Restrictions Weight Bearing Restrictions: Yes LLE Weight Bearing: Non weight bearing   See Function Navigator for Current Functional Status.   Therapy/Group: Individual Therapy  Earnest Conroy Penven-Crew 02/28/2016, 3:31 PM

## 2016-02-28 NOTE — Discharge Instructions (Signed)
Inpatient Rehab Discharge Instructions  Adrian Rivas Discharge date and time: No discharge date for patient encounter.   Activities/Precautions/ Functional Status: Activity: Nonweightbearing left lower extremity with hinged knee brace Diet: regular diet Wound Care: keep wound clean and dry Functional status:  ___ No restrictions     ___ Walk up steps independently ___ 24/7 supervision/assistance   ___ Walk up steps with assistance ___ Intermittent supervision/assistance  ___ Bathe/dress independently ___ Walk with walker     _x__ Bathe/dress with assistance ___ Walk Independently    ___ Shower independently ___ Walk with assistance    ___ Shower with assistance ___ No alcohol     ___ Return to work/school ________  Special Instructions: Home health nurse to check INR on 03/05/2016 with results to DR Lacie ScottsNIEMEYER [phone] fax number [phone]. Patient to remain on Coumadin for DVT prophylaxis to 03/21/2016 and stop   COMMUNITY REFERRALS UPON DISCHARGE:    Home Health:   PT & RN     Agency:[facility] Phone:[phone]  Date of last service:02/29/2016  Medical Equipment/Items Ordered:HAS FROM ACUTE STAY  Agency/Supplier:[facility]   [phone] OTHER: PORTABLE RAMP INFORMATION  My questions have been answered and I understand these instructions. I will adhere to these goals and the provided educational materials after my discharge from the hospital.  Patient/Caregiver Signature _______________________________ Date __________  Clinician Signature _______________________________________ Date __________  Please bring this form and your medication list with you to all your follow-up doctor's appointments.

## 2016-02-28 NOTE — Progress Notes (Signed)
Physical Therapy Discharge Summary  Patient Details  Name: Adrian Rivas MRN: [id number] Date of Birth: Sep 25, 1960  Today's Date: 02/28/2016 PT Individual Time: 0950-1100 PT Individual Time Calculation (min): 70 min   Patient has met 10 of 11 long term goals due to improved activity tolerance, improved balance, improved postural control, increased strength, increased range of motion, decreased pain and ability to compensate for deficits.  Patient to discharge at a wheelchair level Modified Independent.   Patient's care partner is independent to provide the necessary physical assistance at discharge.  Reasons goals not met: Patient requires supervision for community wheelchair propulsion.  Recommendation:  Patient will benefit from ongoing skilled PT services in home health setting to continue to advance safe functional mobility, address ongoing impairments in strength, ROM, activity tolerance, standing balance, pain management, and minimize fall risk.  Equipment: No equipment provided-patient owns RW and manual wheelchair with elevating leg rests  Reasons for discharge: treatment goals met and discharge from hospital  Patient/family agrees with progress made and goals achieved: Yes  Skilled Therapeutic Intervention Patient at mod I-supervision level using RW for simulated car transfer to small SUV height, gait up to 50 ft, transfers, bed mobility, and wheelchair propulsion and parts management. Patient requires min A to negotiate 3" step x 3 using RW ascending backward and descending forward with cues to maintain NWB LLE. Instructed in supine HEP for strengthening and endurance: ankle pumps, quad sets, glute sets, assisted L SLR, assisted L hip abduction/adduction and provided handout. Patient required cues for breathing technique and multiple rest breaks throughout session due to anxiety and shortness of breath. Patient reports ramp installed for home entry with no further  questions/concerns regarding discharge.   PT Discharge Precautions/RestrictionsPrecautions Precautions: Knee Required Braces or Orthoses: Other Brace/Splint Other Brace/Splint: hinged knee brace locked in extension LLE Restrictions Weight Bearing Restrictions: Yes LLE Weight Bearing: Non weight bearing Pain Pain Assessment Pain Assessment: 0-10 Pain Score: 8  Pain Type: Acute pain Pain Location: Knee Pain Orientation: Left Pain Descriptors / Indicators: Aching Pain Frequency: Constant Pain Onset: On-going Pain Intervention(s): RN made aware;Rest Vision/Perception   No change from baseline  Cognition Overall Cognitive Status: Within Functional Limits for tasks assessed Orientation Level: Oriented X4 Sensation Sensation Light Touch: Appears Intact Stereognosis: Appears Intact Hot/Cold: Appears Intact Proprioception: Appears Intact Coordination Gross Motor Movements are Fluid and Coordinated: No Fine Motor Movements are Fluid and Coordinated: Yes Coordination and Movement Description: limited by post op pain and weakness Motor  Motor Motor: Within Functional Limits  Mobility Bed Mobility Bed Mobility: Supine to Sit;Sit to Supine Supine to Sit: 6: Modified independent (Device/Increase time) Sit to Supine: 6: Modified independent (Device/Increase time) Transfers Transfers: Yes Sit to Stand: 6: Modified independent (Device/Increase time);With upper extremity assist Stand to Sit: 6: Modified independent (Device/Increase time);With upper extremity assist Locomotion  Ambulation Ambulation: Yes Ambulation/Gait Assistance: 6: Modified independent (Device/Increase time) Ambulation Distance (Feet): 50 Feet Assistive device: Rolling walker Gait Gait: Yes Gait Pattern: Impaired Gait Pattern: Trunk flexed Gait velocity: decreased Stairs / Additional Locomotion Stairs: Yes Stairs Assistance: 4: Min assist Stair Management Technique: Backwards;Forwards;With walker (ascend  backward, descend forward) Product manager Mobility: Yes Wheelchair Assistance: 6: Modified independent (Device/Increase time) Environmental health practitioner: Both upper extremities Wheelchair Parts Management: Independent Distance: 150 ft  Trunk/Postural Assessment  Cervical Assessment Cervical Assessment: Within Functional Limits Thoracic Assessment Thoracic Assessment: Within Functional Limits Lumbar Assessment Lumbar Assessment: Within Functional Limits Postural Control Postural Control: Within Functional Limits  Balance Balance Balance Assessed:  Yes Static Standing Balance Static Standing - Balance Support: During functional activity;Bilateral upper extremity supported Static Standing - Level of Assistance: 6: Modified independent (Device/Increase time) Dynamic Standing Balance Dynamic Standing - Balance Support: During functional activity Dynamic Standing - Level of Assistance: 6: Modified independent (Device/Increase time) Extremity Assessment  RUE Assessment RUE Assessment: Within Functional Limits LUE Assessment LUE Assessment: Within Functional Limits RLE Assessment RLE Assessment: Within Functional Limits (5/5 throughout except 4-/5 ankle DF (reports hx of foot drop)) LLE Assessment LLE Assessment: Exceptions to [facility] LLE Strength LLE Overall Strength: Deficits;Due to pain;Due to precautions LLE Overall Strength Comments: hip flexion 2-/5, knee NT due to precautions, ankle DF/PF [facility]   See Function Navigator for Current Functional Status.  Carney Living A 02/28/2016, 10:25 AM

## 2016-02-29 LAB — BASIC METABOLIC PANEL
Anion gap: 10 (ref 5–15)
BUN: 6 mg/dL (ref 6–20)
CHLORIDE: 107 mmol/L (ref 101–111)
CO2: 23 mmol/L (ref 22–32)
CREATININE: 0.91 mg/dL (ref 0.61–1.24)
Calcium: 8.7 mg/dL — ABNORMAL LOW (ref 8.9–10.3)
GFR calc Af Amer: >60 mL/min (ref >60)
GFR calc non Af Amer: >60 mL/min (ref >60)
GLUCOSE: 109 mg/dL — AB (ref 65–99)
Potassium: 3.7 mmol/L (ref 3.5–5.1)
Sodium: 140 mmol/L (ref 135–145)

## 2016-02-29 LAB — PROTIME-INR
INR: 2.86
PROTHROMBIN TIME: 30.6 s — AB (ref 11.4–15.2)

## 2016-02-29 MED ORDER — WARFARIN SODIUM 5 MG PO TABS: 5.0000 mg | ORAL_TABLET | Freq: Every day | ORAL | 0 refills | Status: DC

## 2016-02-29 MED ORDER — ROSUVASTATIN CALCIUM 20 MG PO TABS: 20.0000 mg | ORAL_TABLET | Freq: Every day | ORAL | 0 refills | Status: DC

## 2016-02-29 MED ORDER — METHOCARBAMOL 500 MG PO TABS: 500.0000 mg | ORAL_TABLET | Freq: Four times a day (QID) | ORAL | 0 refills | Status: AC | PRN

## 2016-02-29 MED ORDER — LISINOPRIL 40 MG PO TABS: 40.0000 mg | ORAL_TABLET | Freq: Every day | ORAL | 4 refills | Status: DC

## 2016-02-29 MED ORDER — OXYCODONE HCL 5 MG PO TABS: 5.0000 mg | ORAL_TABLET | ORAL | 0 refills | Status: DC | PRN

## 2016-02-29 MED ORDER — METOPROLOL SUCCINATE ER 25 MG PO TB24: 37.5000 mg | ORAL_TABLET | Freq: Every day | ORAL | 1 refills | Status: DC

## 2016-02-29 NOTE — Progress Notes (Signed)
ANTICOAGULATION CONSULT NOTE - Follow Up Consult  Pharmacy Consult for coumadin Indication: VTE prophylaxis  Allergies  Allergen Reactions  . No Known Allergies     Patient Measurements: Height: 5\' 6"  (167.6 cm) Weight: 250 lb 7.1 oz (113.6 kg) IBW/kg (Calculated) : 63.8  Vital Signs: Temp: 98.6 F (37 C) (10/27 0539) Temp Source: Oral (10/27 0539) BP: 131/81 (10/27 0539) Pulse Rate: 82 (10/27 0539)  Labs:  Recent Labs  02/27/16 0344 02/28/16 0639 02/29/16 0718  LABPROT 28.4* 31.0* 30.6*  INR 2.61 2.90 2.86  CREATININE  --   --  0.91    Estimated Creatinine Clearance: 107.3 mL/min (by C-G formula based on SCr of 0.91 mg/dL).   Medications:  Scheduled:  . Brexpiprazole  1 tablet Oral Daily  . desvenlafaxine  100 mg Oral q morning - 10a  . lamoTRIgine  200 mg Oral q morning - 10a  . lisinopril  40 mg Oral Daily  . metoprolol succinate  37.5 mg Oral Daily  . rosuvastatin  20 mg Oral QHS  . tamsulosin  0.4 mg Oral Daily  . warfarin  5 mg Oral ONCE-1800  . Warfarin - Pharmacist Dosing Inpatient   Does not apply q1800   Infusions:    Assessment: 55 yo male s/p ORIF is currently on therapeutic coumadin; INR 2.86 today. Plan for discharge.  Goal of Therapy:  INR 2-3 Monitor platelets by anticoagulation protocol: Yes   Plan:  - Coumadin 5 mg po daily - INR MWF  Bayard HuggerMei Sidrah Harden, PharmD, BCPS  Clinical Pharmacist  Pager: [phone]   02/29/2016,11:59 AM

## 2016-02-29 NOTE — Progress Notes (Signed)
Patient and family discussed all the discharge instructions with Dan A, PA before they left the hospital.   

## 2016-02-29 NOTE — Progress Notes (Signed)
Order was given to remove sutures today from left lower extremity however patient requested to hold until follow-up next week with Dr. Carola FrostHandy and sutures to be removed at that time.

## 2016-02-29 NOTE — Progress Notes (Signed)
[facility]     PROGRESS NOTE  Subjective/Complaints:  Pt seen laying in bed this AM.  He slept well overnight and is looking forward to going home today.  He has questions about removing sutures.    ROS:  Denies CP, SOB, N/V/D.  Objective: Vital Signs: Blood pressure 131/81, pulse 82, temperature 98.6 F (37 C), temperature source Oral, resp. rate 16, height 5\' 6"  (1.676 m), weight 113.6 kg (250 lb 7.1 oz), SpO2 99 %. No results found. No results for input(s): WBC, HGB, HCT, PLT in the last 72 hours. No results for input(s): NA, K, CL, GLUCOSE, BUN, CREATININE, CALCIUM in the last 72 hours.  Invalid input(s): CO CBG (last 3)  No results for input(s): GLUCAP in the last 72 hours.  Wt Readings from Last 3 Encounters:  02/27/16 113.6 kg (250 lb 7.1 oz)  02/15/16 113.4 kg (250 lb)  10/20/12 105.2 kg (232 lb)    Physical Exam:  BP 131/81 (BP Location: Right Arm)   Pulse 82   Temp 98.6 F (37 C) (Oral)   Resp 16   Ht 5\' 6"  (1.676 m)   Wt 113.6 kg (250 lb 7.1 oz)   SpO2 99%   BMI 40.42 kg/m  Constitutional: Well-developed. Obese. NAD. HENT: Normocephalic. Atraumatic. Eyes: EOMare normal. No discharge.  Cardiovascular: RRR. No JVD. Respiratory: Effort normaland breath sounds normal. No respiratory distress.  GI: Soft. Bowel sounds are normal. He exhibits no distension.  Musc: Hinged knee brace LLE Neurological: He is alertand oriented.  Motor: B/l UE, RLE: 5/5 LLE: Hip flexion 3/5, ankle dorsi/plantar flexion 4+/5 Skin: Incision with dressing clean and dry.   Assessment/Plan: 1. Functional deficits secondary to left tibial plateau fracture status post ORIF which require 3+ hours per day of interdisciplinary therapy in a comprehensive inpatient rehab setting. Physiatrist is providing close team supervision and 24 hour management of active medical problems listed below. Physiatrist and rehab team continue to assess barriers to  discharge/monitor patient progress toward functional and medical goals.  Function:  Bathing Bathing position   Position: Wheelchair/chair at sink  Bathing parts Body parts bathed by patient: Right arm, Left arm, Chest, Abdomen, Front perineal area, Right upper leg, Right lower leg Body parts bathed by helper: Back, Buttocks  Bathing assist Assist Level: Touching or steadying assistance(Pt > 75%)      Upper Body Dressing/Undressing Upper body dressing   What is the patient wearing?: Button up shirt     Pull over shirt/dress - Perfomed by patient: Thread/unthread right sleeve, Thread/unthread left sleeve, Put head through opening, Pull shirt over trunk Pull over shirt/dress - Perfomed by helper: Pull shirt over trunk Button up shirt - Perfomed by patient: Thread/unthread right sleeve, Thread/unthread left sleeve, Pull shirt around back, Button/unbutton shirt      Upper body assist Assist Level: More than reasonable time   Set up : To obtain clothing/put away  Lower Body Dressing/Undressing Lower body dressing Lower body dressing/undressing activity did not occur: Refused What is the patient wearing?: Non-skid slipper socks, Shoes     Pants- Performed by patient: Thread/unthread right pants leg, Thread/unthread left pants leg, Pull pants up/down Pants- Performed by helper: Thread/unthread left pants leg, Thread/unthread right pants leg Non-skid slipper socks- Performed by patient: Don/doff right sock Non-skid slipper socks- Performed by helper: Don/doff right sock Socks - Performed by patient: Don/doff right sock   Shoes - Performed by patient: Don/doff right shoe Shoes - Performed by helper: Fasten right  TED Hose - Performed by helper: Don/doff right TED hose  Lower body assist Assist for lower body dressing: More than reasonable time, Assistive device Assistive Device Comment: Reacher, shoe horn, sock-aid    Toileting Toileting   Toileting steps completed by  patient: Adjust clothing prior to toileting, Performs perineal hygiene, Adjust clothing after toileting Toileting steps completed by helper: Adjust clothing prior to toileting, Performs perineal hygiene, Adjust clothing after toileting Toileting Assistive Devices: Toilet aid, Grab bar or rail  Toileting assist Assist level: More than reasonable time   Transfers Chair/bed transfer   Chair/bed transfer method: Stand pivot Chair/bed transfer assist level: No Help, no cues, assistive device, takes more than a reasonable amount of time Chair/bed transfer assistive device: Armrests, Patent attorneyWalker     Locomotion Ambulation     Max distance: 50 ft Assist level: No help, No cues, assistive device, takes more than a reasonable amount of time   Wheelchair   Type: Manual Max wheelchair distance: 150 Assist Level: No help, No cues, assistive device, takes more than reasonable amount of time  Cognition Comprehension Comprehension assist level: Follows complex conversation/direction with extra time/assistive device  Expression Expression assist level: Expresses complex ideas: With extra time/assistive device  Social Interaction Social Interaction assist level: Interacts appropriately with others with medication or extra time (anti-anxiety, antidepressant).  Problem Solving Problem solving assist level: Solves complex 90% of the time/cues < 10% of the time  Memory Memory assist level: Complete Independence: No helper    Medical Problem List and Plan: 1.  Decreased functional mobility secondary to left tibial plateau fracture status post ORIF 02/19/2016.   Non-weightbearing  Cont CIR 2.  DVT Prophylaxis/Anticoagulation:   Coumadin per pharmacy protocol, INR therapeutic  Monitor for any bleeding episodes.   Vascular study negative 10/22 3. Pain Management: Oxycodone and Robaxin as needed. Monitor with increased mobility  Weaning meds as tolerated 4. Mood. Lamictal 200 mg daily, Pristiq 100 mg  daily,Brexipiprazole 1 mg daily. 5. Neuropsych: This patient is capable of making decisions on his own behalf. 6. Skin/Wound Care: Routine skin checks 7. Fluids/Electrolytes/Nutrition: Routine I&Os 8. Hypertension.   Lisinopril 40 mg daily  Toprol 25 mg daily, increased to 37.5 on 10/23.    Controlled at present  9. BPH. Flomax 0.4 mg daily. PVRs 3 checked and discussed with nursing, WNL 10. Hyperlipidemia. Crestor 11. Hypokalemia: Resolved  K 3.3 on 10/27  Supplemented  Will cont to monitor 12. Insomnia  Home Ambien 10mg  started on 10/22 13. Morbid obesity  Body mass index is 40.42 kg/m.  Diet and exercise education  Will cont to encourage weight loss to increase endurance and promote overall health  LOS (Days) 6 A FACE TO FACE EVALUATION WAS PERFORMED  Ankit Karis Jubanil Patel 02/29/2016 8:03 AM

## 2016-02-29 NOTE — Progress Notes (Signed)
Social Work  Discharge Note  The overall goal for the admission was met for:   Discharge location: Yes-HOME WITH WIFE AND [facility]  Length of Stay: Yes-7 DAYS  Discharge activity level: Yes-MOD/I LEVEL  Home/community participation: Yes  Services provided included: MD, RD, PT, OT, RN, CM, TR, Pharmacy and SW  Financial Services: Medicare and Private Insurance: [place]  Follow-up services arranged: Home Health: [facility] and Patient/Family has no preference for HH/DME agencies  Comments (or additional information):PT DID WELL AND HAD PORTABLE RENTAL RAMP INSTALLED UNTIL HE CAN GO UP AND DOWN STAIRS WHEN WB INCREASED. WIFE AND PT COMFORTABLE WITH HIM GOING HOME TODAY. [facility] TO BE THERE WHILE WIFE WORKS. HAS EQUIPMENT FROM ACUTE STAY  Patient/Family verbalized understanding of follow-up arrangements: Yes  Individual responsible for coordination of the follow-up plan: PATIENT & [name]-WIFE  Confirmed correct DME delivered: Elease Hashimoto 02/29/2016    Elease Hashimoto

## 2016-03-13 ENCOUNTER — Other Ambulatory Visit (HOSPITAL_COMMUNITY)
Admission: RE | Admit: 2016-03-13 | Discharge: 2016-03-13 | Disposition: A | Discharge status: Home / Self Care | Payer: BLUE CROSS/BLUE SHIELD | Source: Other Acute Inpatient Hospital | Attending: Family Medicine | Admitting: Family Medicine

## 2016-03-13 DIAGNOSIS — Z5181 Encounter for therapeutic drug level monitoring: Secondary | ICD-10-CM | POA: Insufficient documentation

## 2016-03-13 LAB — PROTIME-INR
INR: 3.56
PROTHROMBIN TIME: 36.4 s — AB (ref 11.4–15.2)

## 2016-03-19 NOTE — Discharge Summary (Signed)
[facility] (OTS)  Patient ID: Adrian Rivas MRN: [id number] DOB/AGE: 1960-07-14 55 y.o.  Admit date: 02/15/2016 Discharge date: 02/23/2016   Admission Diagnoses: Ground level fall  Left bicondylar tibial plateau fracture  HTN COPD   Discharge Diagnoses:  Principal Problem:   Closed bicondylar fracture of left tibial plateau Active Problems:   HTN (hypertension)   COPD exacerbation ([place])  ground level fall    Procedures Performed: 02/19/2016- Dr. Marcelino Scot   1. Open reduction and internal fixation of left bicondylar tibial     plateau. 2. Anterior compartment fasciotomy  Discharged Condition: good  Hospital Course:  Adrian Rivas is an 55 y.o. white male who sustained a fall on 02/15/2016. patient believes he fell  Coming back from the bathroom. He landed on ceramic, floor. He went to [facility] for evaluation and was found to have a left tibial plateau fracture. Patient is established at [facility]. They were contacted regarding his tibial plateau fracture but due to the severe injury patient was transferred to [facility] to the orthopedic trauma service for admission as they felt his injury was beyond scope from their practice. Patient arrived to [facility] around 1730. The patient was seen and evaluated by the orthopedic trauma service. He was only transferred in a knee immobilizer. No compressive dressings had been applied to the patient nor had any ice been placed on the patient as well. Patient complains of pain only in his left knee. Denies any additional injuries also. He is unable to clearly recall the exact mechanism that resulted in his injury.  Pt was admitted for close observation for compartment syndrome which, fortunately, did not develop. Pt had aggressive soft tissue management to address his swelling. Ultimately his swelling resolved enough to allow for surgery during his admission. This was completed on 02/19/2016. Pt  worked with therapies post op and was felt to be an appropriate candidate for CIR. Pt was covered with lovenox and coumadin for DVT/PE prophylaxis. Pt ultimately admitted to CIR on 02/23/2016 in stable condition   Please ortho progress notes and H&P for greater detail of admission and hospitalization     Consults: rehabilitation medicine  Significant Diagnostic Studies: labs:   Results for TORIEN, RAMROOP (MRN [id number]) as of 03/19/2016 16:00  Ref. Range 02/22/2016 06:40  Sodium Latest Ref Range: 135 - 145 mmol/L 141  Potassium Latest Ref Range: 3.5 - 5.1 mmol/L 3.1 (L)  Chloride Latest Ref Range: 101 - 111 mmol/L 105  CO2 Latest Ref Range: 22 - 32 mmol/L 27  BUN Latest Ref Range: 6 - 20 mg/dL 8  Creatinine Latest Ref Range: 0.61 - 1.24 mg/dL 0.75  Calcium Latest Ref Range: 8.9 - 10.3 mg/dL 8.8 (L)  EGFR (Non-African Amer.) Latest Ref Range: >60 mL/min >60  EGFR (African American) Latest Ref Range: >60 mL/min >60  Glucose Latest Ref Range: 65 - 99 mg/dL 118 (H)  Anion gap Latest Ref Range: 5 - 15  9   Results for ARACELI, COUFAL (MRN [id number]) as of 03/19/2016 16:00  Ref. Range 02/22/2016 06:40  Prothrombin Time Latest Ref Range: 11.4 - 15.2 seconds 21.9 (H)  INR Unknown 1.89  Glucose Latest Ref Range: 65 - 99 mg/dL 118 (H)  Results for ELONZO, SOPP (MRN [id number]) as of 03/19/2016 16:00  Ref. Range 02/16/2016 05:50  Vit D, 1,25-Dihydroxy Latest Ref Range: 19.9 - 79.3 pg/mL 63.7  Vitamin D, 25-Hydroxy Latest Ref Range: 30.0 - 100.0 ng/mL 85.1   Results  for JUSTUS, DROKE (MRN [id number]) as of 03/19/2016 16:00  Ref. Range 02/18/2016 02:32  Sex Horm Binding Glob, Serum Latest Ref Range: 19.3 - 76.4 nmol/L 35.7  Testosterone Latest Ref Range: 264 - 916 ng/dL 311  Testosterone Free Latest Ref Range: 7.2 - 24.0 pg/mL 4.1 (L)  Testosterone-% Free Latest Ref Range: 0.2 - 0.7 % 1.7 (H)    Treatments: IV hydration, antibiotics: Ancef, analgesia: dilaudid, percocet, oxy IR,  anticoagulation: LMW heparin and warfarin, therapies: PT, OT, SW and RN and surgery: as above   Discharge Exam:  Orthopaedic Trauma Service (OTS)     Subjective: Patient reports pain as moderate.  Full assist x two for mobilization.    Objective: Temp:  [98.7 F (37.1 C)-100.8 F (38.2 C)] 98.7 F (37.1 C) (10/19 0430) Pulse Rate:  [95-105] 97 (10/19 0430) Resp:  [16-18] 16 (10/19 0430) BP: (127-150)/(57-81) 150/81 (10/19 0430) SpO2:  [86 %-97 %] 91 % (10/19 0900) Physical Exam LLE      Incision intact, clean, dry             Edema/ swelling controlled             Sens: DPN, SPN, TN intact             Motor: EHL, FHL, and lessor toe ext and flex all intact grossly             Brisk cap refill, warm to touch   Assessment/Plan: 2 Days Post-Op Procedure(s) (LRB): OPEN REDUCTION INTERNAL FIXATION (ORIF) TIBIAL PLATEAU (Left) ANTERIOR COMPARTMENT FASCIOTOMY (Left) 1. NWB LLE, full motion of knee and ankle 2. Pain control 3. D/c planning WITH REHAB CONSULTATION 4. Lovenox/ coumadin     Altamese Lillie, MD [facility], [facility] [phone] (p)       Disposition: inpatient rehabilitation  Medications   See admit H&P for inpatient rehab service for medications    Discharge Instructions and Plan: 55 y/o male s/p ground level fall with Left tibial plateau fracture    - Fall   - L bicondylar tibial plateau fracture s/p ORIF              NWB L leg              unrestricted ROM L knee  Dressing changes as needed  Continue to ice and elevate for swelling control   - Pain management:             adjust per CIR team               Pt on chronic opioids and ambien PTA    - ABL anemia/Hemodynamics             Stable              - Medical issues              HTN                         Stable                Depression                          Home meds               Chronic back pain  Pt on chronic opioids and  ambien PTA                ?COPD                         ? If pts underlying issue is sleep apnea                         Pt on ambien daily to help sleep                          Pt has truncal obesity with short, wide neck                            o2 sats 90-91% on 2L    - DVT/PE prophylaxis:             lovenox/coumadin       - Dispo:             dc to CIR    Signed:  Jari Pigg, PA-C [facility] [phone] (P) 03/19/2016, 3:59 PM

## 2016-04-03 ENCOUNTER — Other Ambulatory Visit (HOSPITAL_COMMUNITY)
Admission: RE | Admit: 2016-04-03 | Discharge: 2016-04-03 | Disposition: A | Discharge status: Home / Self Care | Payer: BLUE CROSS/BLUE SHIELD | Source: Other Acute Inpatient Hospital | Attending: Family Medicine | Admitting: Family Medicine

## 2016-04-03 DIAGNOSIS — Z79899 Other long term (current) drug therapy: Secondary | ICD-10-CM | POA: Diagnosis not present

## 2016-04-03 LAB — PROTIME-INR
INR: 4.43
Prothrombin Time: 43.3 seconds — ABNORMAL HIGH (ref 11.4–15.2)

## 2016-04-04 ENCOUNTER — Encounter: Payer: Self-pay | Admitting: Physical Medicine & Rehabilitation

## 2016-04-04 ENCOUNTER — Encounter
Payer: BLUE CROSS/BLUE SHIELD | Attending: Physical Medicine & Rehabilitation | Admitting: Physical Medicine & Rehabilitation

## 2016-04-04 VITALS — BP 133/91 | HR 89 | Resp 14

## 2016-04-04 DIAGNOSIS — Z981 Arthrodesis status: Secondary | ICD-10-CM | POA: Insufficient documentation

## 2016-04-04 DIAGNOSIS — F329 Major depressive disorder, single episode, unspecified: Secondary | ICD-10-CM | POA: Diagnosis not present

## 2016-04-04 DIAGNOSIS — G8929 Other chronic pain: Secondary | ICD-10-CM | POA: Insufficient documentation

## 2016-04-04 DIAGNOSIS — M4800 Spinal stenosis, site unspecified: Secondary | ICD-10-CM | POA: Insufficient documentation

## 2016-04-04 DIAGNOSIS — F5101 Primary insomnia: Secondary | ICD-10-CM

## 2016-04-04 DIAGNOSIS — J449 Chronic obstructive pulmonary disease, unspecified: Secondary | ICD-10-CM | POA: Diagnosis not present

## 2016-04-04 DIAGNOSIS — F419 Anxiety disorder, unspecified: Secondary | ICD-10-CM | POA: Diagnosis not present

## 2016-04-04 DIAGNOSIS — S82142S Displaced bicondylar fracture of left tibia, sequela: Secondary | ICD-10-CM

## 2016-04-04 DIAGNOSIS — Z87891 Personal history of nicotine dependence: Secondary | ICD-10-CM | POA: Diagnosis not present

## 2016-04-04 DIAGNOSIS — R269 Unspecified abnormalities of gait and mobility: Secondary | ICD-10-CM | POA: Insufficient documentation

## 2016-04-04 DIAGNOSIS — F339 Major depressive disorder, recurrent, unspecified: Secondary | ICD-10-CM

## 2016-04-04 DIAGNOSIS — I10 Essential (primary) hypertension: Secondary | ICD-10-CM

## 2016-04-04 DIAGNOSIS — Z9889 Other specified postprocedural states: Secondary | ICD-10-CM | POA: Insufficient documentation

## 2016-04-04 DIAGNOSIS — G894 Chronic pain syndrome: Secondary | ICD-10-CM

## 2016-04-04 DIAGNOSIS — E785 Hyperlipidemia, unspecified: Secondary | ICD-10-CM | POA: Diagnosis not present

## 2016-04-04 DIAGNOSIS — G4700 Insomnia, unspecified: Secondary | ICD-10-CM | POA: Insufficient documentation

## 2016-04-04 NOTE — Progress Notes (Signed)
Subjective:    Patient ID: Adrian Rivas, male    DOB: 12/29/1960, 55 y.o.   MRN: [id number]  HPI 55 year old right-handed male, history of COPD, chronic neck pain with history of anterior fusion as well as multiple low back surgeries presents for hospital follow up for left tibial plateau fracture status post ORIF 02/19/2016.  Since discharge, pt saw Ortho.  He continues to be NWB.  She continues to take coumadin.  His pain is controlled with Robaxin and Hydrocodone, prescribed by Dr. Ethelene Halamos for chronic pain.  Blood pressure is under control.  Overall, pt states he is doing well.  He believes PT is helping.   Pain Inventory Average Pain 7 Pain Right Now 8 My pain is sharp and aching  In the last 24 hours, has pain interfered with the following? General activity 7 Relation with others 7 Enjoyment of life 7 What TIME of day is your pain at its worst? night Sleep (in general) Fair  Pain is worse with: some activites Pain improves with: heat/ice and medication Relief from Meds: 8  Mobility walk with assistance use a walker ability to climb steps?  no do you drive?  no use a wheelchair needs help with transfers  Function retired I need assistance with the following:  dressing, bathing, toileting, meal prep and shopping  Neuro/Psych No problems in this area  Prior Studies hospital follow/up  Physicians involved in your care hospital f/u   History reviewed. No pertinent family history. Social History   Social History  . Marital status: Married    Spouse name: N/A  . Number of children: N/A  . Years of education: N/A   Social History Main Topics  . Smoking status: Former Smoker    Packs/day: 1.00    Years: 20.00    Types: Cigarettes  . Smokeless tobacco: Never Used     Comment: QUIT SMOKING ABOUT 15 YRS AGO  . Alcohol use No  . Drug use: No  . Sexual activity: Not Asked   Other Topics Concern  . None   Social History Narrative  . None   Past  Surgical History:  Procedure Laterality Date  . "SMALL BODIES REMOVED" FROM BOTH SHOULDERS    . BACK SURGERY     LUMBAR LAMINECTOMY  . BILATERAL CARPAL TUNNEL RELEASE    . CERVICAL FUSION    . FASCIOTOMY Left 02/19/2016   Procedure: ANTERIOR COMPARTMENT FASCIOTOMY;  Surgeon: [name], MD;  [facility];  Service: Orthopedics;  Laterality: Left;  . LUMBAR LAMINECTOMY/DECOMPRESSION MICRODISCECTOMY N/A 10/20/2012   Procedure: DECOMPRESSION L2-L3, L1-L2;  Surgeon: [name], MD;  Location: [facility] ORS;  Service: Orthopedics;  Laterality: N/A;  . ORIF TIBIA PLATEAU Left 02/19/2016  . ORIF TIBIA PLATEAU Left 02/19/2016   Procedure: OPEN REDUCTION INTERNAL FIXATION (ORIF) TIBIAL PLATEAU;  Surgeon: [name], MD;  [facility];  Service: Orthopedics;  Laterality: Left;  . RIGHT ACL RECONSTRUCTION     Past Medical History:  Diagnosis Date  . Anxiety   . Arthritis   . COPD (chronic obstructive pulmonary disease) (HCC)    TOLD HE HAS COPD - QUIT SMOKING 15 YRS AGO-- NO C/O OF SOB  . Depression   . Dyslipidemia   . HTN (hypertension)   . Pain    CHRONIC NECK PAIN- HX OF ANTERIOR FUSION  . Pain    LOWER BACK WITH PAIN DOWN BOTH LEGS - BUT WORSE LEFT LEG WITH NUMBNESS OUTER SIDE OF LEFT LEG - HAS  SPINAL STENOSIS   BP (!) 133/91 (BP Location: Left Arm, Patient Position: Sitting, Cuff Size: Large)   Pulse 89   Resp 14   SpO2 96%   Opioid Risk Score:   Fall Risk Score:  `1  Depression screen PHQ 2/9  No flowsheet data found.  Review of Systems  Constitutional: Negative.   HENT: Negative.   Eyes: Negative.   Respiratory: Negative.   Cardiovascular: Negative.   Gastrointestinal: Negative.   Endocrine: Negative.   Genitourinary: Negative.   Musculoskeletal: Positive for gait problem.  Skin: Negative.   Allergic/Immunologic: Negative.   Hematological: Negative.   Psychiatric/Behavioral: Negative.   All other systems reviewed and are negative.     Objective:    Physical Exam Constitutional: Well-developed. Obese. NAD. HENT: Normocephalic. Atraumatic. Eyes: EOM are normal. No discharge.  Cardiovascular: RRR. No JVD.  Respiratory: Effort normal and breath sounds normal. No respiratory distress.  GI: Soft. Bowel sounds are normal. He exhibits no distension.  Musc: No edema, no tenderness. Neurological: He is alert and oriented.  Motor: B/l UE, RLE: 5/5 LLE: Hip flexion 4/5, KE 4-/5 (pain inhibition), ankle dorsi/plantar flexion 4/5 Skin: Incision healing    Assessment & Plan:  55 year old right-handed male, history of COPD, chronic neck pain with history of anterior fusion as well as multiple low back surgeries presents for hospital follow up for left tibial plateau fracture status post ORIF 02/19/2016.   1. Left tibial plateau fracture status post ORIF 02/19/2016.   Cont therapies  Cont to follow up with Ortho   2. DVT Prophylaxis/Anticoagulation:    Cont Coumadin per Ortho  3. Pain Management:   Cont meds per pain doctor  4. Hypertension.   Cont meds  Under control  5. Insomnia  Cont Ambien per Psych  6. Depression  Cont meds per Psychiatry  7. Morbid Obesity  Pt not interested in seeing a dietician  Pt states he is trying  8. Gait abnormality  Cont therapies  Cont NWB per Ortho

## 2016-07-03 ENCOUNTER — Encounter
Payer: BLUE CROSS/BLUE SHIELD | Attending: Physical Medicine & Rehabilitation | Admitting: Physical Medicine & Rehabilitation

## 2016-07-03 DIAGNOSIS — Z9889 Other specified postprocedural states: Secondary | ICD-10-CM | POA: Insufficient documentation

## 2016-07-03 DIAGNOSIS — M4800 Spinal stenosis, site unspecified: Secondary | ICD-10-CM | POA: Insufficient documentation

## 2016-07-03 DIAGNOSIS — I10 Essential (primary) hypertension: Secondary | ICD-10-CM | POA: Insufficient documentation

## 2016-07-03 DIAGNOSIS — E785 Hyperlipidemia, unspecified: Secondary | ICD-10-CM | POA: Insufficient documentation

## 2016-07-03 DIAGNOSIS — F419 Anxiety disorder, unspecified: Secondary | ICD-10-CM | POA: Insufficient documentation

## 2016-07-03 DIAGNOSIS — Z981 Arthrodesis status: Secondary | ICD-10-CM | POA: Insufficient documentation

## 2016-07-03 DIAGNOSIS — F329 Major depressive disorder, single episode, unspecified: Secondary | ICD-10-CM | POA: Insufficient documentation

## 2016-07-03 DIAGNOSIS — Z87891 Personal history of nicotine dependence: Secondary | ICD-10-CM | POA: Insufficient documentation

## 2016-07-03 DIAGNOSIS — R269 Unspecified abnormalities of gait and mobility: Secondary | ICD-10-CM | POA: Insufficient documentation

## 2016-07-03 DIAGNOSIS — G4700 Insomnia, unspecified: Secondary | ICD-10-CM | POA: Insufficient documentation

## 2016-07-03 DIAGNOSIS — J449 Chronic obstructive pulmonary disease, unspecified: Secondary | ICD-10-CM | POA: Insufficient documentation

## 2016-07-03 DIAGNOSIS — G8929 Other chronic pain: Secondary | ICD-10-CM | POA: Insufficient documentation

## 2016-08-28 ENCOUNTER — Ambulatory Visit (INDEPENDENT_AMBULATORY_CARE_PROVIDER_SITE_OTHER): Payer: BLUE CROSS/BLUE SHIELD | Admitting: Family Medicine

## 2016-08-28 ENCOUNTER — Encounter: Payer: Self-pay | Admitting: Family Medicine

## 2016-08-28 VITALS — BP 184/110 | HR 88 | Temp 97.5°F | Resp 20 | Ht 66.0 in | Wt 258.0 lb

## 2016-08-28 DIAGNOSIS — Z7689 Persons encountering health services in other specified circumstances: Secondary | ICD-10-CM

## 2016-08-28 DIAGNOSIS — N400 Enlarged prostate without lower urinary tract symptoms: Secondary | ICD-10-CM

## 2016-08-28 DIAGNOSIS — I10 Essential (primary) hypertension: Secondary | ICD-10-CM

## 2016-08-28 DIAGNOSIS — J439 Emphysema, unspecified: Secondary | ICD-10-CM

## 2016-08-28 DIAGNOSIS — E785 Hyperlipidemia, unspecified: Secondary | ICD-10-CM | POA: Diagnosis not present

## 2016-08-28 DIAGNOSIS — J449 Chronic obstructive pulmonary disease, unspecified: Secondary | ICD-10-CM

## 2016-08-28 MED ORDER — HYDROCHLOROTHIAZIDE 25 MG PO TABS: 25.0000 mg | ORAL_TABLET | Freq: Every day | ORAL | 11 refills | Status: DC

## 2016-08-28 MED ORDER — MOMETASONE FUROATE 50 MCG/ACT NA SUSP: 2.0000 | Freq: Every day | NASAL | 11 refills | Status: DC | PRN

## 2016-08-28 NOTE — Patient Instructions (Addendum)
Need old records Take the metoprolol 50 mg a day Take the lisinopril 40 mg ad ay Add HCTZ once A day in the morning Eat a heart healthy diet Walk everyday that you are able  See nurse in one week for BP check  See me in 2-3 weeks for a PE   DASH Eating Plan DASH stands for "Dietary Approaches to Stop Hypertension." The DASH eating plan is a healthy eating plan that has been shown to reduce high blood pressure (hypertension). It may also reduce your risk for type 2 diabetes, heart disease, and stroke. The DASH eating plan may also help with weight loss. What are tips for following this plan? General guidelines   Avoid eating more than 2,300 mg (milligrams) of salt (sodium) a day. If you have hypertension, you may need to reduce your sodium intake to 1,500 mg a day.  Limit alcohol intake to no more than 1 drink a day for nonpregnant women and 2 drinks a day for men. One drink equals 12 oz of beer, 5 oz of wine, or 1 oz of hard liquor.  Work with your health care provider to maintain a healthy body weight or to lose weight. Ask what an ideal weight is for you.  Get at least 30 minutes of exercise that causes your heart to beat faster (aerobic exercise) most days of the week. Activities may include walking, swimming, or biking.  Work with your health care provider or diet and nutrition specialist (dietitian) to adjust your eating plan to your individual calorie needs. Reading food labels   Check food labels for the amount of sodium per serving. Choose foods with less than 5 percent of the Daily Value of sodium. Generally, foods with less than 300 mg of sodium per serving fit into this eating plan.  To find whole grains, look for the word "whole" as the first word in the ingredient list. Shopping   Buy products labeled as "low-sodium" or "no salt added."  Buy fresh foods. Avoid canned foods and premade or frozen meals. Cooking   Avoid adding salt when cooking. Use salt-free  seasonings or herbs instead of table salt or sea salt. Check with your health care provider or pharmacist before using salt substitutes.  Do not fry foods. Cook foods using healthy methods such as baking, boiling, grilling, and broiling instead.  Cook with heart-healthy oils, such as olive, canola, soybean, or sunflower oil. Meal planning    Eat a balanced diet that includes:  5 or more servings of fruits and vegetables each day. At each meal, try to fill half of your plate with fruits and vegetables.  Up to 6-8 servings of whole grains each day.  Less than 6 oz of lean meat, poultry, or fish each day. A 3-oz serving of meat is about the same size as a deck of cards. One egg equals 1 oz.  2 servings of low-fat dairy each day.  A serving of nuts, seeds, or beans 5 times each week.  Heart-healthy fats. Healthy fats called Omega-3 fatty acids are found in foods such as flaxseeds and coldwater fish, like sardines, salmon, and mackerel.  Limit how much you eat of the following:  Canned or prepackaged foods.  Food that is high in trans fat, such as fried foods.  Food that is high in saturated fat, such as fatty meat.  Sweets, desserts, sugary drinks, and other foods with added sugar.  Full-fat dairy products.  Do not salt foods before eating.  Try to eat at least 2 vegetarian meals each week.  Eat more home-cooked food and less restaurant, buffet, and fast food.  When eating at a restaurant, ask that your food be prepared with less salt or no salt, if possible. What foods are recommended? The items listed may not be a complete list. Talk with your dietitian about what dietary choices are best for you. Grains  Whole-grain or whole-wheat bread. Whole-grain or whole-wheat pasta. Brown rice. Orpah Cobb. Bulgur. Whole-grain and low-sodium cereals. Pita bread. Low-fat, low-sodium crackers. Whole-wheat flour tortillas. Vegetables  Fresh or frozen vegetables (raw, steamed,  roasted, or grilled). Low-sodium or reduced-sodium tomato and vegetable juice. Low-sodium or reduced-sodium tomato sauce and tomato paste. Low-sodium or reduced-sodium canned vegetables. Fruits  All fresh, dried, or frozen fruit. Canned fruit in natural juice (without added sugar). Meat and other protein foods  Skinless chicken or [place]. Ground chicken or [place]. Pork with fat trimmed off. Fish and seafood. Egg whites. Dried beans, peas, or lentils. Unsalted nuts, nut butters, and seeds. Unsalted canned beans. Lean cuts of beef with fat trimmed off. Low-sodium, lean deli meat. Dairy  Low-fat (1%) or fat-free (skim) milk. Fat-free, low-fat, or reduced-fat cheeses. Nonfat, low-sodium ricotta or cottage cheese. Low-fat or nonfat yogurt. Low-fat, low-sodium cheese. Fats and oils  Soft margarine without trans fats. Vegetable oil. Low-fat, reduced-fat, or light mayonnaise and salad dressings (reduced-sodium). Canola, safflower, olive, soybean, and sunflower oils. Avocado. Seasoning and other foods  Herbs. Spices. Seasoning mixes without salt. Unsalted popcorn and pretzels. Fat-free sweets. What foods are not recommended? The items listed may not be a complete list. Talk with your dietitian about what dietary choices are best for you. Grains  Baked goods made with fat, such as croissants, muffins, or some breads. Dry pasta or rice meal packs. Vegetables  Creamed or fried vegetables. Vegetables in a cheese sauce. Regular canned vegetables (not low-sodium or reduced-sodium). Regular canned tomato sauce and paste (not low-sodium or reduced-sodium). Regular tomato and vegetable juice (not low-sodium or reduced-sodium). Rosita Fire. Olives. Fruits  Canned fruit in a light or heavy syrup. Fried fruit. Fruit in cream or butter sauce. Meat and other protein foods  Fatty cuts of meat. Ribs. Fried meat. Tomasa Blase. Sausage. Bologna and other processed lunch meats. Salami. Fatback. Hotdogs. Bratwurst. Salted nuts and  seeds. Canned beans with added salt. Canned or smoked fish. Whole eggs or egg yolks. Chicken or [place] with skin. Dairy  Whole or 2% milk, cream, and half-and-half. Whole or full-fat cream cheese. Whole-fat or sweetened yogurt. Full-fat cheese. Nondairy creamers. Whipped toppings. Processed cheese and cheese spreads. Fats and oils  Butter. Stick margarine. Lard. Shortening. Ghee. Bacon fat. Tropical oils, such as coconut, palm kernel, or palm oil. Seasoning and other foods  Salted popcorn and pretzels. Onion salt, garlic salt, seasoned salt, table salt, and sea salt. Worcestershire sauce. Tartar sauce. Barbecue sauce. Teriyaki sauce. Soy sauce, including reduced-sodium. Steak sauce. Canned and packaged gravies. Fish sauce. Oyster sauce. Cocktail sauce. Horseradish that you find on the shelf. Ketchup. Mustard. Meat flavorings and tenderizers. Bouillon cubes. Hot sauce and Tabasco sauce. Premade or packaged marinades. Premade or packaged taco seasonings. Relishes. Regular salad dressings. Where to find more information:  National Heart, Lung, and Blood Institute: PopSteam.is  American Heart Association: www.heart.org Summary  The DASH eating plan is a healthy eating plan that has been shown to reduce high blood pressure (hypertension). It may also reduce your risk for type 2 diabetes, heart disease, and stroke.  With the DASH  eating plan, you should limit salt (sodium) intake to 2,300 mg a day. If you have hypertension, you may need to reduce your sodium intake to 1,500 mg a day.  When on the DASH eating plan, aim to eat more fresh fruits and vegetables, whole grains, lean proteins, low-fat dairy, and heart-healthy fats.  Work with your health care provider or diet and nutrition specialist (dietitian) to adjust your eating plan to your individual calorie needs. This information is not intended to replace advice given to you by your health care provider. Make sure you discuss any questions  you have with your health care provider. Document Released: 04/10/2011 Document Revised: 04/14/2016 Document Reviewed: 04/14/2016 Elsevier Interactive Patient Education  2017 [employer].

## 2016-08-28 NOTE — Progress Notes (Signed)
Chief Complaint  Patient presents with  . Establish Care  This patient is new to the practice. Old records are incomplete. Primary care records are requested. He states his last primary care doctor stop taking his insurance. He has not had medical care for several months. He is compliant with his medications. The only refill he needs today is Nasonex. He sees a pain specialist for chronic pain from multiple back and neck surgeries. He is taking hydrocodone 10 mg on a regular basis. He does not have any dizziness drowsiness or constipation from the narcotic pain medication. He sees a psychiatrist on a regular basis. This is for depression. He is on multiple medications. He states he is stable at this time. He is morbidly obese. He doesn't exercise because of his pain. We discussed that his obesity is affecting his blood pressure, his overall health, and exacerbating his pain from arthritis. Diet and exercise is strongly recommended to him.  He has essential hypertension. He takes metoprolol 50 mg a day. He takes lisinopril 40 mg a day. In spite of his blood pressure is uncontrolled. I'm going to add hydrochlorothiazide 25 mg a day. He'll come back in a week or 2 for blood pressure check. His blood pressure still elevated and when to double his Toprol. He may need calcium channel blocker. He may need referral blood pressure is recalcitrant. He has BPH. He had mild urinary symptoms. He was placed on Flomax. His symptoms improved. He doesn't recall ever having had PSA test. He states he is up-to-date with colon cancer screening. He gets a flu shot yearly. He has never had a pneumonia shot. Doesn't recall his last tetanus.     Patient Active Problem List   Diagnosis Date Noted  . Morbid obesity (HCC)   . Primary insomnia   . Benign prostatic hyperplasia without lower urinary tract symptoms   . Hyperlipidemia   . Abnormality of gait   . COPD exacerbation (HCC)   . HTN (hypertension)   . Spinal  stenosis of lumbar region 10/20/2012    Outpatient Encounter Prescriptions as of 08/28/2016  Medication Sig  . cholecalciferol (VITAMIN D) 400 UNITS TABS Take 400 Units by mouth daily.  Marland Kitchen desvenlafaxine (PRISTIQ) 100 MG 24 hr tablet Take 100 mg by mouth every morning.  Marland Kitchen HYDROcodone-acetaminophen (NORCO) 10-325 MG tablet Take 1 tablet by mouth every 8 (eight) hours.   Marland Kitchen lamoTRIgine (LAMICTAL) 100 MG tablet Take 200 mg by mouth every morning. TAKES 200 mg in the morning and  in the afternoon  . lisinopril (PRINIVIL,ZESTRIL) 40 MG tablet Take 1 tablet (40 mg total) by mouth daily.  . methocarbamol (ROBAXIN) 500 MG tablet Take 1 tablet (500 mg total) by mouth every 6 (six) hours as needed for muscle spasms.  . metoprolol succinate (TOPROL-XL) 50 MG 24 hr tablet Take 50 mg by mouth daily.   . mometasone (NASONEX) 50 MCG/ACT nasal spray Place 2 sprays into the nose daily as needed.  . Multiple Vitamin (MULTIVITAMIN WITH MINERALS) TABS Take 1 tablet by mouth daily.  Marland Kitchen REXULTI 1 MG TABS 1 tablet daily.  . rosuvastatin (CRESTOR) 20 MG tablet Take 1 tablet (20 mg total) by mouth at bedtime.  . tamsulosin (FLOMAX) 0.4 MG CAPS capsule 1 capsule daily.  Marland Kitchen zolpidem (AMBIEN) 10 MG tablet Take 10 mg by mouth at bedtime as needed for sleep.  . hydrochlorothiazide (HYDRODIURIL) 25 MG tablet Take 1 tablet (25 mg total) by mouth daily.   No facility-administered encounter  medications on file as of 08/28/2016.     Past Medical History:  Diagnosis Date  . Anxiety   . Arthritis    spine  . COPD (chronic obstructive pulmonary disease) (HCC)    TOLD HE HAS COPD - QUIT SMOKING 15 YRS AGO-- NO C/O OF SOB  . Depression   . Dyslipidemia   . HTN (hypertension)   . Hyperlipidemia   . Pain    CHRONIC NECK PAIN- HX OF ANTERIOR FUSION  . Pain    LOWER BACK WITH PAIN DOWN BOTH LEGS - BUT WORSE LEFT LEG WITH NUMBNESS OUTER SIDE OF LEFT LEG - HAS SPINAL STENOSIS  . Substance abuse    quit drinking 15 + years  ago    Past Surgical History:  Procedure Laterality Date  . "SMALL BODIES REMOVED" FROM BOTH SHOULDERS    . BACK SURGERY     LUMBAR LAMINECTOMY  . BILATERAL CARPAL TUNNEL RELEASE    . CERVICAL FUSION    . FASCIOTOMY Left 02/19/2016   Procedure: ANTERIOR COMPARTMENT FASCIOTOMY;  Surgeon: Myrene Galas, MD;  Location: [facility] OR;  Service: Orthopedics;  Laterality: Left;  . FRACTURE SURGERY    . LUMBAR LAMINECTOMY/DECOMPRESSION MICRODISCECTOMY N/A 10/20/2012   Procedure: DECOMPRESSION L2-L3, L1-L2;  Surgeon: Javier Docker, MD;  Location: [facility] ORS;  Service: Orthopedics;  Laterality: N/A;  . ORIF TIBIA PLATEAU Left 02/19/2016  . ORIF TIBIA PLATEAU Left 02/19/2016   Procedure: OPEN REDUCTION INTERNAL FIXATION (ORIF) TIBIAL PLATEAU;  Surgeon: Myrene Galas, MD;  Location: [facility] OR;  Service: Orthopedics;  Laterality: Left;  . RIGHT ACL RECONSTRUCTION    . SPINE SURGERY      Social History   Social History  . Marital status: Married    Spouse name: Marylu Lund  . Number of children: 1  . Years of education: 76   Occupational History  . disabled     back and neck  .      Film/video editor   Social History Main Topics  . Smoking status: Former Smoker    Packs/day: 1.00    Years: 20.00    Types: Cigarettes    Quit date: 05/05/1996  . Smokeless tobacco: Never Used     Comment: QUIT SMOKING ABOUT 15 YRS AGO  . Alcohol use No  . Drug use: No  . Sexual activity: Not on file   Other Topics Concern  . Not on file   Social History Narrative   Lives with second wife Mardi Mainland is only bio child, she is pregnant   Barbara Cower , stepson still at home      collects coins    Family History  Problem Relation Age of Onset  . Clotting disorder Mother   . COPD Mother   . Hypertension Mother   . Early death Mother 71    fracture - blood clot  . Kidney disease Father   . COPD Father   . Stroke Father     Review of Systems  Constitutional: Negative for chills, fever and weight loss.    HENT: Negative for congestion and hearing loss.   Eyes: Negative for blurred vision and pain.  Respiratory: Positive for shortness of breath. Negative for cough.   Cardiovascular: Negative for chest pain and leg swelling.  Gastrointestinal: Negative for abdominal pain, constipation, diarrhea and heartburn.  Genitourinary: Negative for dysuria and frequency.  Musculoskeletal: Positive for back pain, joint pain and neck pain. Negative for falls and myalgias.  Neurological: Positive for headaches. Negative  for dizziness and seizures.       When blood pressure high  Psychiatric/Behavioral: Positive for depression. The patient is nervous/anxious. The patient does not have insomnia.        Controlled with medication    BP (!) 184/110 (BP Location: Right Arm, Patient Position: Sitting, Cuff Size: Large)   Pulse 88   Temp 97.5 F (36.4 C) (Temporal)   Resp 20   Ht 5\' 6"  (1.676 m)   Wt 258 lb (117 kg)   SpO2 92%   BMI 41.64 kg/m   Physical Exam  Constitutional: He is oriented to person, place, and time. He appears well-developed and well-nourished.  Morbidly obese  HENT:  Head: Normocephalic and atraumatic.  Mouth/Throat: Oropharynx is clear and moist.  Eyes: Conjunctivae are normal. Pupils are equal, round, and reactive to light.  Neck: Normal range of motion. Neck supple. No thyromegaly present.  Cardiovascular: Normal rate, regular rhythm and normal heart sounds.   Pulmonary/Chest: Effort normal and breath sounds normal. No respiratory distress.  Abdominal: Soft. Bowel sounds are normal.  Musculoskeletal: Normal range of motion. He exhibits no edema.  Scar anterior right knee from old anterior cruciate ligament, new scars left knee from recent fracture  Lymphadenopathy:    He has no cervical adenopathy.  Neurological: He is alert and oriented to person, place, and time.  Gait normal  Skin: Skin is warm and dry.  Psychiatric: He has a normal mood and affect. His behavior is  normal. Thought content normal.  Depressed mood. Hesitant speech pattern  Nursing note and vitals reviewed.   1. Essential hypertension Uncontrolled. Patient is on an ACE inhibitor and beta blocker. Would add a thiazide. We'll push the beta blockers and the next step, and think about adding a calcium channel blocker. He may need referral. I discussed with him that walking, exercise, salt restriction might help   2. COPD    3. Benign prostatic hyperplasia without lower urinary tract symptoms   4. Morbid obesity (HCC) - CBC - COMPLETE METABOLIC PANEL WITH GFR - Lipid panel - VITAMIN D 25 Hydroxy (Vit-D Deficiency, Fractures) - Urinalysis, Routine w reflex microscopic  5. Hyperlipidemia, unspecified hyperlipidemia type   6. Encounter to establish care with new doctor Will obtain old records  Patient Instructions  Need old records Take the metoprolol 50 mg a day Take the lisinopril 40 mg ad ay Add HCTZ once A day in the morning Eat a heart healthy diet Walk everyday that you are able  See nurse in one week for BP check  See me in 2-3 weeks for a PE   DASH Eating Plan DASH stands for "Dietary Approaches to Stop Hypertension." The DASH eating plan is a healthy eating plan that has been shown to reduce high blood pressure (hypertension). It may also reduce your risk for type 2 diabetes, heart disease, and stroke. The DASH eating plan may also help with weight loss. What are tips for following this plan? General guidelines   Avoid eating more than 2,300 mg (milligrams) of salt (sodium) a day. If you have hypertension, you may need to reduce your sodium intake to 1,500 mg a day.  Limit alcohol intake to no more than 1 drink a day for nonpregnant women and 2 drinks a day for men. One drink equals 12 oz of beer, 5 oz of wine, or 1 oz of hard liquor.  Work with your health care provider to maintain a healthy body weight or to lose weight. Ask  what an ideal weight is for  you.  Get at least 30 minutes of exercise that causes your heart to beat faster (aerobic exercise) most days of the week. Activities may include walking, swimming, or biking.  Work with your health care provider or diet and nutrition specialist (dietitian) to adjust your eating plan to your individual calorie needs. Reading food labels   Check food labels for the amount of sodium per serving. Choose foods with less than 5 percent of the Daily Value of sodium. Generally, foods with less than 300 mg of sodium per serving fit into this eating plan.  To find whole grains, look for the word "whole" as the first word in the ingredient list. Shopping   Buy products labeled as "low-sodium" or "no salt added."  Buy fresh foods. Avoid canned foods and premade or frozen meals. Cooking   Avoid adding salt when cooking. Use salt-free seasonings or herbs instead of table salt or sea salt. Check with your health care provider or pharmacist before using salt substitutes.  Do not fry foods. Cook foods using healthy methods such as baking, boiling, grilling, and broiling instead.  Cook with heart-healthy oils, such as olive, canola, soybean, or sunflower oil. Meal planning    Eat a balanced diet that includes:  5 or more servings of fruits and vegetables each day. At each meal, try to fill half of your plate with fruits and vegetables.  Up to 6-8 servings of whole grains each day.  Less than 6 oz of lean meat, poultry, or fish each day. A 3-oz serving of meat is about the same size as a deck of cards. One egg equals 1 oz.  2 servings of low-fat dairy each day.  A serving of nuts, seeds, or beans 5 times each week.  Heart-healthy fats. Healthy fats called Omega-3 fatty acids are found in foods such as flaxseeds and coldwater fish, like sardines, salmon, and mackerel.  Limit how much you eat of the following:  Canned or prepackaged foods.  Food that is high in trans fat, such as fried  foods.  Food that is high in saturated fat, such as fatty meat.  Sweets, desserts, sugary drinks, and other foods with added sugar.  Full-fat dairy products.  Do not salt foods before eating.  Try to eat at least 2 vegetarian meals each week.  Eat more home-cooked food and less restaurant, buffet, and fast food.  When eating at a restaurant, ask that your food be prepared with less salt or no salt, if possible. What foods are recommended? The items listed may not be a complete list. Talk with your dietitian about what dietary choices are best for you. Grains  Whole-grain or whole-wheat bread. Whole-grain or whole-wheat pasta. Brown rice. Orpah Cobb. Bulgur. Whole-grain and low-sodium cereals. Pita bread. Low-fat, low-sodium crackers. Whole-wheat flour tortillas. Vegetables  Fresh or frozen vegetables (raw, steamed, roasted, or grilled). Low-sodium or reduced-sodium tomato and vegetable juice. Low-sodium or reduced-sodium tomato sauce and tomato paste. Low-sodium or reduced-sodium canned vegetables. Fruits  All fresh, dried, or frozen fruit. Canned fruit in natural juice (without added sugar). Meat and other protein foods  Skinless chicken or [place]. Ground chicken or [place]. Pork with fat trimmed off. Fish and seafood. Egg whites. Dried beans, peas, or lentils. Unsalted nuts, nut butters, and seeds. Unsalted canned beans. Lean cuts of beef with fat trimmed off. Low-sodium, lean deli meat. Dairy  Low-fat (1%) or fat-free (skim) milk. Fat-free, low-fat, or reduced-fat cheeses. Nonfat, low-sodium  ricotta or cottage cheese. Low-fat or nonfat yogurt. Low-fat, low-sodium cheese. Fats and oils  Soft margarine without trans fats. Vegetable oil. Low-fat, reduced-fat, or light mayonnaise and salad dressings (reduced-sodium). Canola, safflower, olive, soybean, and sunflower oils. Avocado. Seasoning and other foods  Herbs. Spices. Seasoning mixes without salt. Unsalted popcorn and pretzels.  Fat-free sweets. What foods are not recommended? The items listed may not be a complete list. Talk with your dietitian about what dietary choices are best for you. Grains  Baked goods made with fat, such as croissants, muffins, or some breads. Dry pasta or rice meal packs. Vegetables  Creamed or fried vegetables. Vegetables in a cheese sauce. Regular canned vegetables (not low-sodium or reduced-sodium). Regular canned tomato sauce and paste (not low-sodium or reduced-sodium). Regular tomato and vegetable juice (not low-sodium or reduced-sodium). Rosita Fire. Olives. Fruits  Canned fruit in a light or heavy syrup. Fried fruit. Fruit in cream or butter sauce. Meat and other protein foods  Fatty cuts of meat. Ribs. Fried meat. Tomasa Blase. Sausage. Bologna and other processed lunch meats. Salami. Fatback. Hotdogs. Bratwurst. Salted nuts and seeds. Canned beans with added salt. Canned or smoked fish. Whole eggs or egg yolks. Chicken or [place] with skin. Dairy  Whole or 2% milk, cream, and half-and-half. Whole or full-fat cream cheese. Whole-fat or sweetened yogurt. Full-fat cheese. Nondairy creamers. Whipped toppings. Processed cheese and cheese spreads. Fats and oils  Butter. Stick margarine. Lard. Shortening. Ghee. Bacon fat. Tropical oils, such as coconut, palm kernel, or palm oil. Seasoning and other foods  Salted popcorn and pretzels. Onion salt, garlic salt, seasoned salt, table salt, and sea salt. Worcestershire sauce. Tartar sauce. Barbecue sauce. Teriyaki sauce. Soy sauce, including reduced-sodium. Steak sauce. Canned and packaged gravies. Fish sauce. Oyster sauce. Cocktail sauce. Horseradish that you find on the shelf. Ketchup. Mustard. Meat flavorings and tenderizers. Bouillon cubes. Hot sauce and Tabasco sauce. Premade or packaged marinades. Premade or packaged taco seasonings. Relishes. Regular salad dressings. Where to find more information:  National Heart, Lung, and Blood Institute:  PopSteam.is  American Heart Association: www.heart.org Summary  The DASH eating plan is a healthy eating plan that has been shown to reduce high blood pressure (hypertension). It may also reduce your risk for type 2 diabetes, heart disease, and stroke.  With the DASH eating plan, you should limit salt (sodium) intake to 2,300 mg a day. If you have hypertension, you may need to reduce your sodium intake to 1,500 mg a day.  When on the DASH eating plan, aim to eat more fresh fruits and vegetables, whole grains, lean proteins, low-fat dairy, and heart-healthy fats.  Work with your health care provider or diet and nutrition specialist (dietitian) to adjust your eating plan to your individual calorie needs. This information is not intended to replace advice given to you by your health care provider. Make sure you discuss any questions you have with your health care provider. Document Released: 04/10/2011 Document Revised: 04/14/2016 Document Reviewed: 04/14/2016 Elsevier Interactive Patient Education  2017 Elsevier Inc.    Eustace Moore, MD

## 2016-09-04 ENCOUNTER — Ambulatory Visit: Payer: BLUE CROSS/BLUE SHIELD

## 2016-09-04 VITALS — BP 142/88 | HR 88

## 2016-09-04 DIAGNOSIS — I10 Essential (primary) hypertension: Secondary | ICD-10-CM

## 2016-09-12 ENCOUNTER — Encounter: Payer: Self-pay | Admitting: Family Medicine

## 2016-09-12 DIAGNOSIS — F1911 Other psychoactive substance abuse, in remission: Secondary | ICD-10-CM | POA: Insufficient documentation

## 2016-09-15 ENCOUNTER — Ambulatory Visit: Payer: BLUE CROSS/BLUE SHIELD

## 2016-09-15 VITALS — BP 118/74 | HR 84

## 2016-09-15 DIAGNOSIS — I10 Essential (primary) hypertension: Secondary | ICD-10-CM

## 2016-09-22 ENCOUNTER — Other Ambulatory Visit: Payer: Self-pay

## 2016-09-22 MED ORDER — ROSUVASTATIN CALCIUM 20 MG PO TABS: 20.0000 mg | ORAL_TABLET | Freq: Every day | ORAL | 1 refills | Status: DC

## 2016-09-22 MED ORDER — METOPROLOL SUCCINATE ER 50 MG PO TB24: 50.0000 mg | ORAL_TABLET | Freq: Every day | ORAL | 1 refills | Status: DC

## 2016-09-23 ENCOUNTER — Encounter: Payer: Self-pay | Admitting: Family Medicine

## 2016-09-23 ENCOUNTER — Ambulatory Visit (INDEPENDENT_AMBULATORY_CARE_PROVIDER_SITE_OTHER): Payer: BLUE CROSS/BLUE SHIELD | Admitting: Family Medicine

## 2016-09-23 VITALS — BP 120/76 | HR 116 | Temp 97.3°F | Resp 20 | Ht 66.0 in | Wt 250.1 lb

## 2016-09-23 DIAGNOSIS — Z23 Encounter for immunization: Secondary | ICD-10-CM

## 2016-09-23 DIAGNOSIS — N400 Enlarged prostate without lower urinary tract symptoms: Secondary | ICD-10-CM

## 2016-09-23 DIAGNOSIS — J439 Emphysema, unspecified: Secondary | ICD-10-CM | POA: Diagnosis not present

## 2016-09-23 DIAGNOSIS — Z0000 Encounter for general adult medical examination without abnormal findings: Secondary | ICD-10-CM

## 2016-09-23 DIAGNOSIS — E785 Hyperlipidemia, unspecified: Secondary | ICD-10-CM | POA: Diagnosis not present

## 2016-09-23 DIAGNOSIS — I10 Essential (primary) hypertension: Secondary | ICD-10-CM | POA: Diagnosis not present

## 2016-09-23 MED ORDER — METOPROLOL SUCCINATE ER 50 MG PO TB24: 50.0000 mg | ORAL_TABLET | Freq: Every day | ORAL | 3 refills | Status: AC

## 2016-09-23 MED ORDER — TAMSULOSIN HCL 0.4 MG PO CAPS: 0.4000 mg | ORAL_CAPSULE | Freq: Every day | ORAL | 3 refills | Status: DC

## 2016-09-23 MED ORDER — LISINOPRIL 40 MG PO TABS: 40.0000 mg | ORAL_TABLET | Freq: Every day | ORAL | 3 refills | Status: DC

## 2016-09-23 NOTE — Patient Instructions (Addendum)
Stay as active as you can manage Try to reduce your portions and high fat/calorie foods to lose weight  No change in medicines  Need shots today Need labs today  See me in 3 months Call sooner for problems   Preventive Care 40-64 Years, Male Preventive care refers to lifestyle choices and visits with your health care provider that can promote health and wellness. What does preventive care include?  A yearly physical exam. This is also called an annual well check.  Dental exams once or twice a year.  Routine eye exams. Ask your health care provider how often you should have your eyes checked.  Personal lifestyle choices, including:  Daily care of your teeth and gums.  Regular physical activity.  Eating a healthy diet.  Avoiding tobacco and drug use.  Limiting alcohol use.  Taking low-dose aspirin every day starting at age 58. What happens during an annual well check? The services and screenings done by your health care provider during your annual well check will depend on your age, overall health, lifestyle risk factors, and family history of disease. Counseling  Your health care provider may ask you questions about your:  Alcohol use.  Tobacco use.  Drug use.  Emotional well-being.  Home and relationship well-being.  Sexual activity.  Eating habits.  Work and work Astronomerenvironment. Screening  You may have the following tests or measurements:  Height, weight, and BMI.  Blood pressure.  Lipid and cholesterol levels. These may be checked every 5 years, or more frequently if you are over 58 years old.  Skin check.  Lung cancer screening. You may have this screening every year starting at age 58 if you have a 30-pack-year history of smoking and currently smoke or have quit within the past 15 years.  Flexible sigmoidoscopy or colonoscopy. You may have a sigmoidoscopy every 5 years or a colonoscopy every 10 years starting at age 58.  Prostate cancer  screening. Recommendations will vary depending on your family history and other risks.  Hepatitis C blood test.  Diabetes screening. This is done by checking your blood sugar (glucose) after you have not eaten for a while (fasting). You may have this done every 1-3 years. Discuss your test results, treatment options, and if necessary, the need for more tests with your health care provider. Vaccines  Your health care provider may recommend certain vaccines, such as:  Influenza vaccine. This is recommended every year.  Tetanus, diphtheria, and acellular pertussis (Tdap, Td) vaccine. You may need a Td booster every 10 years.  Zoster vaccine. You may need this after age 58.  also need a second dose.  Pneumococcal 13-valent conjugate (PCV13) vaccine. You may need this if you have certain conditions and have not been vaccinated.  Pneumococcal polysaccharide (PPSV23) vaccine. You may need one or two doses if you smoke cigarettes or if you have certain conditions.

## 2016-09-23 NOTE — Progress Notes (Signed)
Chief Complaint  Patient presents with  . Annual Exam   His health maintenance is up to date Getting the Tdap today Weight is stable.  Weight loss is again discussed and recommended.  Discussed diet/portions and daily exercise. Pain management currently adequate through pain clinic Sees psychiatrist for depression/anxiety/insomnia.  Has been on current medicines for "years" flomax works well for his LUTS.  He has had regular PSA testing at prior office. [facility] PCP notes reviewed.   Patient Active Problem List   Diagnosis Date Noted  . 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 Morbid obesity (HCC)   . Primary insomnia   . Benign prostatic hyperplasia without lower urinary tract symptoms   . Hyperlipidemia   . Abnormality of gait   . COPD (chronic obstructive pulmonary disease) (HCC)   . HTN (hypertension)   . Spinal stenosis of lumbar region 10/20/2012    Outpatient Encounter Prescriptions as of 09/23/2016  Medication Sig  . cholecalciferol (VITAMIN D) 400 UNITS TABS Take 400 Units by mouth daily.  Marland Kitchen desvenlafaxine (PRISTIQ) 100 MG 24 hr tablet Take 100 mg by mouth every morning.  . hydrochlorothiazide (HYDRODIURIL) 25 MG tablet Take 1 tablet (25 mg total) by mouth daily.  Marland Kitchen HYDROcodone-acetaminophen (NORCO) 10-325 MG tablet Take 1 tablet by mouth every 8 (eight) hours.   Marland Kitchen lamoTRIgine (LAMICTAL) 100 MG tablet Take 200 mg by mouth every morning. TAKES 200 mg in the morning and 100mg  in the afternoon  . lisinopril (PRINIVIL,ZESTRIL) 40 MG tablet Take 1 tablet (40 mg total) by mouth daily.  . methocarbamol (ROBAXIN) 500 MG tablet Take 1 tablet (500 mg total) by mouth every 6 (six) hours as needed for muscle spasms.  . metoprolol succinate (TOPROL-XL) 50 MG 24 hr tablet Take 1 tablet (50 mg total) by mouth daily. Take with or immediately following a meal.  . mometasone (NASONEX) 50 MCG/ACT nasal spray Place 2 sprays into the nose daily as needed.  . Multiple Vitamin (MULTIVITAMIN  WITH MINERALS) TABS Take 1 tablet by mouth daily.  Marland Kitchen REXULTI 1 MG TABS 1 tablet daily.  . rosuvastatin (CRESTOR) 20 MG tablet Take 1 tablet (20 mg total) by mouth at bedtime.  . tamsulosin (FLOMAX) 0.4 MG CAPS capsule Take 1 capsule (0.4 mg total) by mouth daily.  Marland Kitchen zolpidem (AMBIEN) 10 MG tablet Take 10 mg by mouth at bedtime as needed for sleep.   No facility-administered encounter medications on file as of 09/23/2016.     Allergies  Allergen Reactions  . No Known Allergies     Review of Systems  Constitutional: Negative for activity change, appetite change and unexpected weight change.  HENT: Negative for congestion and dental problem.        Regular dental care  Eyes: Negative for photophobia and visual disturbance.       Sees Eye doctor yearly  Respiratory: Positive for shortness of breath. Negative for cough.        Poor exercise tolerance  Cardiovascular: Negative for chest pain, palpitations and leg swelling.  Gastrointestinal: Negative for blood in stool, constipation and diarrhea.  Genitourinary: Negative for difficulty urinating and frequency.  Musculoskeletal: Positive for arthralgias, back pain and gait problem.  Skin: Negative for color change.       "barnacles" - indicates age related changes  Neurological: Negative for dizziness and facial asymmetry. Speech difficulty: .normalma.  Psychiatric/Behavioral: Negative for behavioral problems, decreased concentration and sleep disturbance. The patient is not nervous/anxious.  Controlled    BP 120/76 (BP Location: Right Arm, Patient Position: Sitting, Cuff Size: Large)   Pulse (!) 116   Temp 97.3 F (36.3 C) (Temporal)   Resp 20   Ht 5\' 6"  (1.676 m)   Wt 250 lb 1.3 oz (113.4 kg)   SpO2 93%   BMI 40.36 kg/m   Physical Exam   BP 120/76 (BP Location: Right Arm, Patient Position: Sitting, Cuff Size: Large)   Pulse (!) 116   Temp 97.3 F (36.3 C) (Temporal)   Resp 20   Ht 5\' 6"  (1.676 m)   Wt 250 lb 1.3 oz  (113.4 kg)   SpO2 93%   BMI 40.36 kg/m   General Appearance:    Alert, cooperative, no distress, appears stated age.  Obese.  Diaphoretic, perspires heavily  Head:    Normocephalic, without obvious abnormality, atraumatic  Eyes:    PERRL, conjunctiva/corneas clear, EOM's intact, fundi    benign, both eyes       Ears:    Normal TM's and external ear canals, both ears. Partial occlusion cerumen  Nose:   Nares normal, septum midline, mucosa normal, no drainage   or sinus tenderness  Throat:   Lips, mucosa, and tongue normal; teeth and gums normal  Neck:   Supple, symmetrical, trachea midline, no adenopathy;       thyroid:  No enlargement/tenderness/nodules; no carotid   bruit  Back:     Symmetric, no curvature, ROM decreased.  Well healed lumbar scar, no CVA tenderness  Lungs:     Clear to auscultation bilaterally, respirations unlabored  Chest wall:    No tenderness or deformity  Heart:    Regular rate and rhythm, S1 and S2 normal, no murmur, rub   or gallop  Abdomen:     Soft, non-tender, bowel sounds active all four quadrants,    no masses, no organomegaly.  Obese.  Genitalia:    Normal male without lesion, discharge or tenderness  Extremities:   Extremities normal, atraumatic, no cyanosis or edema  Pulses:   2+ and symmetric all extremities  Skin:   Skin color, texture, turgor normal, no rashes or lesions  Lymph nodes:   Cervical, supraclavicular, and axillary nodes normal  Neurologic:   Normal strength, sensation and reflexes      throughout     ASSESSMENT/PLAN:  1. Essential hypertension controlled  2. Pulmonary emphysema, unspecified emphysema type (HCC) asymptomatic  3. Benign prostatic hyperplasia without lower urinary tract symptoms On flomax  4. Hyperlipidemia, unspecified hyperlipidemia type Crestor.  Will update labs  5. Annual physical exam Discussed health screening.  Immunizations.  Obesity and a plan for reducing.   Patient Instructions  Stay as  active as you can manage Try to reduce your portions and high fat/calorie foods to lose weight  No change in medicines  Need shots today Need labs today  See me in 3 months Call sooner for problems   Preventive Care 40-64 Years, Male Preventive care refers to lifestyle choices and visits with your health care provider that can promote health and wellness. What does preventive care include?  A yearly physical exam. This is also called an annual well check.  Dental exams once or twice a year.  Routine eye exams. Ask your health care provider how often you should have your eyes checked.  Personal lifestyle choices, including:  Daily care of your teeth and gums.  Regular physical activity.  Eating a healthy diet.  Avoiding tobacco and drug use.  Limiting alcohol use.  Taking low-dose aspirin every day starting at age 58. What happens during an annual well check? The services and screenings done by your health care provider during your annual well check will depend on your age, overall health, lifestyle risk factors, and family history of disease. Counseling  Your health care provider may ask you questions about your:  Alcohol use.  Tobacco use.  Drug use.  Emotional well-being.  Home and relationship well-being.  Sexual activity.  Eating habits.  Work and work Astronomerenvironment. Screening  You may have the following tests or measurements:  Height, weight, and BMI.  Blood pressure.  Lipid and cholesterol levels. These may be checked every 5 years, or more frequently if you are over 58 years old.  Skin check.  Lung cancer screening. You may have this screening every year starting at age 58 if you have a 30-pack-year history of smoking and currently smoke or have quit within the past 15 years.  Flexible sigmoidoscopy or colonoscopy. You may have a sigmoidoscopy every 5 years or a colonoscopy every 10 years starting at age 58.  Prostate cancer screening.  Recommendations will vary depending on your family history and other risks.  Hepatitis C blood test.  Diabetes screening. This is done by checking your blood sugar (glucose) after you have not eaten for a while (fasting). You may have this done every 1-3 years. Discuss your test results, treatment options, and if necessary, the need for more tests with your health care provider. Vaccines  Your health care provider may recommend certain vaccines, such as:  Influenza vaccine. This is recommended every year.  Tetanus, diphtheria, and acellular pertussis (Tdap, Td) vaccine. You may need a Td booster every 10 years.  Zoster vaccine. You may need this after age 58.  also need a second dose.  Pneumococcal 13-valent conjugate (PCV13) vaccine. You may need this if you have certain conditions and have not been vaccinated.  Pneumococcal polysaccharide (PPSV23) vaccine. You may need one or two doses if you smoke cigarettes or if you have certain conditions.     Eustace MooreYvonne Sue Kail Fraley, MD

## 2016-09-24 ENCOUNTER — Encounter: Payer: Self-pay | Admitting: Family Medicine

## 2016-09-24 DIAGNOSIS — N183 Chronic kidney disease, stage 3 unspecified: Secondary | ICD-10-CM

## 2016-09-24 LAB — LIPID PANEL
CHOLESTEROL: 244 mg/dL — AB (ref <200)
HDL: 60 mg/dL (ref >40)
LDL CALC: 145 mg/dL — AB (ref <100)
TRIGLYCERIDES: 193 mg/dL — AB (ref <150)
Total CHOL/HDL Ratio: 4.1 Ratio (ref <5.0)
VLDL: 39 mg/dL — AB (ref <30)

## 2016-09-24 LAB — CBC
HEMATOCRIT: 49.4 % (ref 38.5–50.0)
HEMOGLOBIN: 17.3 g/dL — AB (ref 13.2–17.1)
MCH: 35.5 pg — ABNORMAL HIGH (ref 27.0–33.0)
MCHC: 35 g/dL (ref 32.0–36.0)
MCV: 101.2 fL — ABNORMAL HIGH (ref 80.0–100.0)
MPV: 9.6 fL (ref 7.5–12.5)
Platelets: 219 10*3/uL (ref 140–400)
RBC: 4.88 MIL/uL (ref 4.20–5.80)
RDW: 13.7 % (ref 11.0–15.0)
WBC: 14.3 10*3/uL — AB (ref 3.8–10.8)

## 2016-09-24 LAB — URINALYSIS, MICROSCOPIC ONLY
Bacteria, UA: NONE SEEN [HPF]
Crystals: NONE SEEN [HPF]
SQUAMOUS EPITHELIAL / LPF: NONE SEEN [HPF] (ref <=5)
Yeast: NONE SEEN [HPF]

## 2016-09-24 LAB — CMP 10231
AG RATIO: 1.7 ratio (ref 1.0–2.5)
ALK PHOS: 115 U/L (ref 40–115)
ALT: 37 U/L (ref 9–46)
AST: 62 U/L — ABNORMAL HIGH (ref 10–35)
Albumin: 4.8 g/dL (ref 3.6–5.1)
BUN / CREAT RATIO: 15 ratio (ref 6–22)
BUN: 31 mg/dL — ABNORMAL HIGH (ref 7–25)
CO2: 24 mmol/L (ref 20–31)
CREATININE: 2.06 mg/dL — AB (ref 0.70–1.33)
Calcium: 10.6 mg/dL — ABNORMAL HIGH (ref 8.6–10.3)
Chloride: 97 mmol/L — ABNORMAL LOW (ref 98–110)
GFR, EST AFRICAN AMERICAN: 40 mL/min — AB (ref >=60)
GFR, EST NON AFRICAN AMERICAN: 35 mL/min — AB (ref >=60)
Globulin: 2.8 g/dL (ref 1.9–3.7)
Glucose, Bld: 105 mg/dL — ABNORMAL HIGH (ref 65–99)
Potassium: 4.4 mmol/L (ref 3.5–5.3)
Sodium: 140 mmol/L (ref 135–146)
Total Bilirubin: 1 mg/dL (ref 0.2–1.2)
Total Protein: 7.6 g/dL (ref 6.1–8.1)

## 2016-09-24 LAB — URINALYSIS, ROUTINE W REFLEX MICROSCOPIC
Bilirubin Urine: NEGATIVE
Glucose, UA: NEGATIVE
HGB URINE DIPSTICK: NEGATIVE
NITRITE: NEGATIVE
Specific Gravity, Urine: 1.031 (ref 1.001–1.035)
pH: 5 (ref 5.0–8.0)

## 2016-09-24 LAB — VITAMIN D 25 HYDROXY (VIT D DEFICIENCY, FRACTURES): Vit D, 25-Hydroxy: 64 ng/mL (ref 30–100)

## 2016-09-24 NOTE — Progress Notes (Signed)
Left message for tim to call office

## 2016-09-26 ENCOUNTER — Telehealth: Payer: Self-pay

## 2016-09-26 NOTE — Telephone Encounter (Signed)
Returning a call

## 2016-10-01 NOTE — Telephone Encounter (Signed)
Called tim, states he doesn't need anything from us.

## 2016-12-23 ENCOUNTER — Telehealth: Payer: Self-pay | Admitting: *Deleted

## 2016-12-23 NOTE — Telephone Encounter (Signed)
Patient called stating he has been falling and feeling light headed and it started a couple weeks ago. [phone]

## 2016-12-23 NOTE — Telephone Encounter (Signed)
Called and spoke to tim, states he has been getting dizzy at home, no other sx, no other c/o. He feels his b/p may be too low, but is unable to give me any readings. Also states he has fallen x 3 at home, w/o injury. Advised to have wife bring him to office for b/p ck.

## 2016-12-24 ENCOUNTER — Ambulatory Visit: Payer: Medicare Other

## 2016-12-24 VITALS — BP 122/74 | HR 88 | Resp 20

## 2016-12-24 DIAGNOSIS — I10 Essential (primary) hypertension: Secondary | ICD-10-CM

## 2016-12-27 ENCOUNTER — Other Ambulatory Visit: Payer: Self-pay | Admitting: Family Medicine

## 2016-12-29 ENCOUNTER — Ambulatory Visit: Payer: BLUE CROSS/BLUE SHIELD | Admitting: Family Medicine

## 2016-12-29 NOTE — Telephone Encounter (Signed)
Seen 09/23/16

## 2017-01-15 ENCOUNTER — Ambulatory Visit (INDEPENDENT_AMBULATORY_CARE_PROVIDER_SITE_OTHER): Payer: Commercial Managed Care - PPO | Admitting: Family Medicine

## 2017-01-15 ENCOUNTER — Ambulatory Visit: Payer: Commercial Managed Care - PPO | Admitting: Family Medicine

## 2017-01-15 ENCOUNTER — Encounter: Payer: Self-pay | Admitting: Family Medicine

## 2017-01-15 VITALS — BP 112/70 | HR 86 | Temp 97.6°F | Resp 18 | Ht 66.0 in | Wt 250.1 lb

## 2017-01-15 DIAGNOSIS — I10 Essential (primary) hypertension: Secondary | ICD-10-CM

## 2017-01-15 DIAGNOSIS — M48062 Spinal stenosis, lumbar region with neurogenic claudication: Secondary | ICD-10-CM | POA: Diagnosis not present

## 2017-01-15 DIAGNOSIS — J439 Emphysema, unspecified: Secondary | ICD-10-CM

## 2017-01-15 DIAGNOSIS — E785 Hyperlipidemia, unspecified: Secondary | ICD-10-CM | POA: Diagnosis not present

## 2017-01-15 MED ORDER — AEROCHAMBER PLUS FLO-VU MEDIUM MISC: 1.0000 | Freq: Once | 0 refills | Status: AC

## 2017-01-15 MED ORDER — ALBUTEROL SULFATE HFA 108 (90 BASE) MCG/ACT IN AERS: 2.0000 | INHALATION_SPRAY | Freq: Four times a day (QID) | RESPIRATORY_TRACT | 0 refills | Status: AC | PRN

## 2017-01-15 MED ORDER — TIOTROPIUM BROMIDE MONOHYDRATE 18 MCG IN CAPS: 18.0000 ug | ORAL_CAPSULE | Freq: Every day | RESPIRATORY_TRACT | 12 refills | Status: DC

## 2017-01-15 NOTE — Patient Instructions (Signed)
Need lab tests and follow up in November  Use spiriva daily  Use the albuterol as a "rescue inhaler" for acute shortness of breath with wheezing Bring inhalers to the office if you need to learn proper use  Come back next week for flu shot and prenvar

## 2017-01-15 NOTE — Progress Notes (Signed)
Chief Complaint  Patient presents with  . Shortness of Breath  known emphysema Never used an inhaler His dyspnea is getting worse Is very inactive due to orthopedic problems Is also obese - weight is stable Has more shortness of breath with walking Has spells of wheeze that scare him to palpitation and panic denies stress BP is controlled Compliant with meds and his statin No chest pain or cough or sputum Wants handicap sticker due to orthopedic limitations in walking  Patient Active Problem List   Diagnosis Date Noted  . 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 Morbid obesity (HCC)   . Primary insomnia   . Benign prostatic hyperplasia without lower urinary tract symptoms   . Hyperlipidemia   . Abnormality of gait   . COPD (chronic obstructive pulmonary disease) (HCC)   . HTN (hypertension)   . Spinal stenosis of lumbar region 10/20/2012    Outpatient Encounter Prescriptions as of 01/15/2017  Medication Sig  . cholecalciferol (VITAMIN D) 400 UNITS TABS Take 400 Units by mouth daily.  Marland Kitchen. desvenlafaxine (PRISTIQ) 100 MG 24 hr tablet Take 100 mg by mouth every morning.  . hydrochlorothiazide (HYDRODIURIL) 25 MG tablet Take 1 tablet (25 mg total) by mouth daily.  Marland Kitchen. HYDROcodone-acetaminophen (NORCO) 10-325 MG tablet Take 1 tablet by mouth every 8 (eight) hours.   Marland Kitchen. lamoTRIgine (LAMICTAL) 100 MG tablet Take 200 mg by mouth every morning. TAKES 200 mg in the morning and 100mg  in the afternoon  . lisinopril (PRINIVIL,ZESTRIL) 40 MG tablet Take 1 tablet (40 mg total) by mouth daily.  . methocarbamol (ROBAXIN) 500 MG tablet Take 1 tablet (500 mg total) by mouth every 6 (six) hours as needed for muscle spasms.  . metoprolol succinate (TOPROL-XL) 50 MG 24 hr tablet Take 1 tablet (50 mg total) by mouth daily. Take with or immediately following a meal.  . mometasone (NASONEX) 50 MCG/ACT nasal spray Place 2 sprays into the nose daily as needed.  . Multiple Vitamin (MULTIVITAMIN WITH  MINERALS) TABS Take 1 tablet by mouth daily.  Marland Kitchen. REXULTI 1 MG TABS 1 tablet daily.  . rosuvastatin (CRESTOR) 20 MG tablet TAKE ONE TABLET BY MOUTH DAILY.  . tamsulosin (FLOMAX) 0.4 MG CAPS capsule Take 1 capsule (0.4 mg total) by mouth daily.  Marland Kitchen. zolpidem (AMBIEN) 10 MG tablet Take 10 mg by mouth at bedtime as needed for sleep.  Marland Kitchen. albuterol (PROVENTIL HFA;VENTOLIN HFA) 108 (90 Base) MCG/ACT inhaler Inhale 2 puffs into the lungs every 6 (six) hours as needed for wheezing or shortness of breath.  . Spacer/Aero-Holding Chambers (AEROCHAMBER PLUS FLO-VU MEDIUM) MISC 1 each by Other route once. Generic spacer  . tiotropium (SPIRIVA) 18 MCG inhalation capsule Place 1 capsule (18 mcg total) into inhaler and inhale daily.   No facility-administered encounter medications on file as of 01/15/2017.     Allergies  Allergen Reactions  . No Known Allergies     Review of Systems  Constitutional: Negative for activity change, appetite change and unexpected weight change.  HENT: Negative for congestion and dental problem.        Regular dental care  Eyes: Negative for photophobia and visual disturbance.       Sees Eye doctor yearly  Respiratory: Positive for shortness of breath and wheezing. Negative for cough.        Poor exercise tolerance.  Increased SOB  Cardiovascular: Negative for chest pain, palpitations and leg swelling.  Gastrointestinal: Negative for blood in stool, constipation and diarrhea.  Genitourinary: Negative for difficulty urinating and frequency.  Musculoskeletal: Positive for arthralgias, back pain and gait problem.  Neurological: Negative for dizziness, facial asymmetry and speech difficulty.  Psychiatric/Behavioral: Negative for behavioral problems, decreased concentration and sleep disturbance. The patient is not nervous/anxious.        Controlled    BP 112/70 (BP Location: Left Arm, Patient Position: Sitting, Cuff Size: Large)   Pulse 86   Temp 97.6 F (36.4 C) (Temporal)    Resp 18   Ht  (1.676 m)   Wt 250 lb 1.9 oz (113.5 kg)   SpO2 95%   BMI 40.37 kg/m   Physical Exam  Constitutional: He is oriented to person, place, and time. He appears well-developed and well-nourished.  Morbidly obese  HENT:  Head: Normocephalic and atraumatic.  Right Ear: External ear normal.  Left Ear: External ear normal.  Mouth/Throat: Oropharynx is clear and moist.  Teeth well repaired  Eyes: Pupils are equal, round, and reactive to light. Conjunctivae are normal.  Neck: Normal range of motion. Neck supple. No thyromegaly present.  Cardiovascular: Normal rate, regular rhythm and normal heart sounds.   Pulmonary/Chest: Effort normal and breath sounds normal. No respiratory distress.  Abdominal: Soft. Bowel sounds are normal.  Musculoskeletal: Normal range of motion. He exhibits no edema.  Scar anterior right knee from old anterior cruciate ligament, new scars left knee from recent fracture  Lymphadenopathy:    He has no cervical adenopathy.  Neurological: He is alert and oriented to person, place, and time.  Gait normal  Skin: Skin is warm and dry.  Psychiatric: His behavior is normal. Thought content normal.  Depressed mood. Hesitant speech pattern.  Reduced face expression  Nursing note and vitals reviewed.   ASSESSMENT/PLAN:  1. Pulmonary emphysema, unspecified emphysema type (HCC) Will start LAMA with rescue inhaler  2. Hyperlipidemia, unspecified hyperlipidemia type - Lipid panel  3. Essential hypertension controlled - CBC - COMPLETE METABOLIC PANEL WITH GFR - Urinalysis, Routine w reflex microscopic  4. Spinal stenosis of lumbar region with neurogenic claudication Handicap sticker   Patient Instructions  Need lab tests and follow up in November  Use spiriva daily  Use the albuterol as a "rescue inhaler" for acute shortness of breath with wheezing Bring inhalers to the office if you need to learn proper use  Come back next week for flu shot  and prenvar       Eustace Moore, MD

## 2017-03-12 ENCOUNTER — Telehealth: Payer: Self-pay | Admitting: *Deleted

## 2017-03-12 NOTE — Telephone Encounter (Signed)
Patient's wife called stating she is getting ready to leave work and she will need to be called at [phone].

## 2017-03-12 NOTE — Telephone Encounter (Signed)
Patient's wife called stating patient has had difficulty breathing and Dr Delton SeeNelson has prescribed him with inhalers for his COPD but he is still having trouble. Patient is also having problems with his feet swelling. Per wife patient has not had a problem with this before. Please advise [phone]

## 2017-03-12 NOTE — Telephone Encounter (Signed)
Returned wife's call.

## 2017-03-13 NOTE — Telephone Encounter (Signed)
I have tried both numbers again this am, and am unable to reach wife.

## 2017-03-20 ENCOUNTER — Emergency Department (HOSPITAL_COMMUNITY)
Admission: EM | Admit: 2017-03-20 | Discharge: 2017-03-20 | Disposition: A | Discharge status: Home / Self Care | Payer: Commercial Managed Care - PPO | Attending: Emergency Medicine | Admitting: Emergency Medicine

## 2017-03-20 ENCOUNTER — Encounter: Payer: Self-pay | Admitting: Family Medicine

## 2017-03-20 ENCOUNTER — Other Ambulatory Visit: Payer: Self-pay

## 2017-03-20 ENCOUNTER — Ambulatory Visit (INDEPENDENT_AMBULATORY_CARE_PROVIDER_SITE_OTHER): Payer: Commercial Managed Care - PPO | Admitting: Family Medicine

## 2017-03-20 ENCOUNTER — Emergency Department (HOSPITAL_COMMUNITY): Payer: Commercial Managed Care - PPO

## 2017-03-20 ENCOUNTER — Encounter (HOSPITAL_COMMUNITY): Payer: Self-pay | Admitting: Emergency Medicine

## 2017-03-20 VITALS — BP 102/64 | HR 92 | Temp 97.6°F | Resp 20 | Ht 66.0 in | Wt 256.0 lb

## 2017-03-20 DIAGNOSIS — Z23 Encounter for immunization: Secondary | ICD-10-CM

## 2017-03-20 DIAGNOSIS — I951 Orthostatic hypotension: Secondary | ICD-10-CM | POA: Diagnosis not present

## 2017-03-20 DIAGNOSIS — Z7982 Long term (current) use of aspirin: Secondary | ICD-10-CM | POA: Insufficient documentation

## 2017-03-20 DIAGNOSIS — Z87891 Personal history of nicotine dependence: Secondary | ICD-10-CM | POA: Insufficient documentation

## 2017-03-20 DIAGNOSIS — R630 Anorexia: Secondary | ICD-10-CM

## 2017-03-20 DIAGNOSIS — R5383 Other fatigue: Secondary | ICD-10-CM | POA: Diagnosis present

## 2017-03-20 DIAGNOSIS — J449 Chronic obstructive pulmonary disease, unspecified: Secondary | ICD-10-CM

## 2017-03-20 DIAGNOSIS — E876 Hypokalemia: Secondary | ICD-10-CM | POA: Diagnosis not present

## 2017-03-20 DIAGNOSIS — R0602 Shortness of breath: Secondary | ICD-10-CM | POA: Diagnosis not present

## 2017-03-20 DIAGNOSIS — Z79899 Other long term (current) drug therapy: Secondary | ICD-10-CM | POA: Diagnosis not present

## 2017-03-20 DIAGNOSIS — R4182 Altered mental status, unspecified: Secondary | ICD-10-CM | POA: Diagnosis not present

## 2017-03-20 DIAGNOSIS — R0902 Hypoxemia: Secondary | ICD-10-CM

## 2017-03-20 DIAGNOSIS — R296 Repeated falls: Secondary | ICD-10-CM

## 2017-03-20 DIAGNOSIS — R400 Somnolence: Secondary | ICD-10-CM

## 2017-03-20 DIAGNOSIS — R600 Localized edema: Secondary | ICD-10-CM | POA: Diagnosis not present

## 2017-03-20 DIAGNOSIS — I10 Essential (primary) hypertension: Secondary | ICD-10-CM | POA: Diagnosis not present

## 2017-03-20 LAB — CBC WITH DIFFERENTIAL/PLATELET
Basophils Absolute: 0 10*3/uL (ref 0.0–0.1)
Basophils Relative: 1 %
Eosinophils Absolute: 0.1 10*3/uL (ref 0.0–0.7)
Eosinophils Relative: 2 %
HEMATOCRIT: 44.4 % (ref 39.0–52.0)
Hemoglobin: 14.2 g/dL (ref 13.0–17.0)
LYMPHS ABS: 1.1 10*3/uL (ref 0.7–4.0)
LYMPHS PCT: 19 %
MCH: 35.9 pg — AB (ref 26.0–34.0)
MCHC: 32 g/dL (ref 30.0–36.0)
MCV: 112.4 fL — AB (ref 78.0–100.0)
MONO ABS: 0.8 10*3/uL (ref 0.1–1.0)
MONOS PCT: 13 %
NEUTROS ABS: 4 10*3/uL (ref 1.7–7.7)
Neutrophils Relative %: 66 %
Platelets: 159 10*3/uL (ref 150–400)
RBC: 3.95 MIL/uL — ABNORMAL LOW (ref 4.22–5.81)
RDW: 14.8 % (ref 11.5–15.5)
WBC: 6.1 10*3/uL (ref 4.0–10.5)

## 2017-03-20 LAB — COMPREHENSIVE METABOLIC PANEL
ALK PHOS: 93 U/L (ref 38–126)
ALT: 34 U/L (ref 17–63)
ANION GAP: 15 (ref 5–15)
AST: 82 U/L — ABNORMAL HIGH (ref 15–41)
Albumin: 4 g/dL (ref 3.5–5.0)
BILIRUBIN TOTAL: 1 mg/dL (ref 0.3–1.2)
BUN: 12 mg/dL (ref 6–20)
CALCIUM: 9.5 mg/dL (ref 8.9–10.3)
CO2: 28 mmol/L (ref 22–32)
Chloride: 97 mmol/L — ABNORMAL LOW (ref 101–111)
Creatinine, Ser: 1.1 mg/dL (ref 0.61–1.24)
GFR calc Af Amer: >60 mL/min (ref >60)
Glucose, Bld: 96 mg/dL (ref 65–99)
POTASSIUM: 2.9 mmol/L — AB (ref 3.5–5.1)
Sodium: 140 mmol/L (ref 135–145)
TOTAL PROTEIN: 6.9 g/dL (ref 6.5–8.1)

## 2017-03-20 LAB — URINALYSIS, ROUTINE W REFLEX MICROSCOPIC
Bilirubin Urine: NEGATIVE
Glucose, UA: NEGATIVE mg/dL
Ketones, ur: 20 mg/dL — AB
LEUKOCYTES UA: NEGATIVE
NITRITE: NEGATIVE
Protein, ur: 100 mg/dL — AB
SPECIFIC GRAVITY, URINE: 1.02 (ref 1.005–1.030)
pH: 5 (ref 5.0–8.0)

## 2017-03-20 LAB — CK: CK TOTAL: 383 U/L (ref 49–397)

## 2017-03-20 LAB — POCT CBG (FASTING - GLUCOSE)-MANUAL ENTRY: GLUCOSE FASTING, POC: 117 mg/dL — AB (ref 70–99)

## 2017-03-20 LAB — TSH: TSH: 3.056 u[IU]/mL (ref 0.350–4.500)

## 2017-03-20 LAB — TROPONIN I: Troponin I: <0.03 ng/mL (ref <0.03)

## 2017-03-20 LAB — BLOOD GAS, ARTERIAL
ACID-BASE EXCESS: 5.2 mmol/L — AB (ref 0.0–2.0)
BICARBONATE: 28.9 mmol/L — AB (ref 20.0–28.0)
DRAWN BY: 277331
O2 Content: 3 L/min
O2 Saturation: 92.6 %
PCO2 ART: 40.4 mmHg (ref 32.0–48.0)
PH ART: 7.468 — AB (ref 7.350–7.450)
PO2 ART: 70.5 mmHg — AB (ref 83.0–108.0)
Patient temperature: 37

## 2017-03-20 LAB — ETHANOL: ALCOHOL ETHYL (B): 150 mg/dL — AB (ref <10)

## 2017-03-20 LAB — I-STAT CG4 LACTIC ACID, ED
LACTIC ACID, VENOUS: 3.26 mmol/L — AB (ref 0.5–1.9)
LACTIC ACID, VENOUS: 2.55 mmol/L — AB (ref 0.5–1.9)

## 2017-03-20 LAB — BRAIN NATRIURETIC PEPTIDE: B NATRIURETIC PEPTIDE 5: 37 pg/mL (ref 0.0–100.0)

## 2017-03-20 MED ORDER — MAGNESIUM SULFATE 2 GM/50ML IV SOLN
2.0000 g | INTRAVENOUS | Status: AC
  Administered 2017-03-20: 2 g via INTRAVENOUS
  Filled 2017-03-20: qty 50

## 2017-03-20 MED ORDER — POTASSIUM CHLORIDE CRYS ER 20 MEQ PO TBCR
40.0000 meq | EXTENDED_RELEASE_TABLET | Freq: Once | ORAL | Status: AC
  Administered 2017-03-20: 40 meq via ORAL
  Filled 2017-03-20: qty 2

## 2017-03-20 MED ORDER — POTASSIUM CHLORIDE ER 10 MEQ PO TBCR: 10.0000 meq | EXTENDED_RELEASE_TABLET | Freq: Every day | ORAL | 0 refills | Status: DC

## 2017-03-20 MED ORDER — IOPAMIDOL (ISOVUE-370) INJECTION 76%
100.0000 mL | Freq: Once | INTRAVENOUS | Status: AC | PRN
  Administered 2017-03-20: 100 mL via INTRAVENOUS

## 2017-03-20 MED ORDER — LACTATED RINGERS IV BOLUS (SEPSIS): 1000.0000 mL | Freq: Once | INTRAVENOUS | Status: DC

## 2017-03-20 MED ORDER — ALBUTEROL SULFATE (2.5 MG/3ML) 0.083% IN NEBU
5.0000 mg | INHALATION_SOLUTION | Freq: Once | RESPIRATORY_TRACT | Status: AC
  Administered 2017-03-20: 5 mg via RESPIRATORY_TRACT
  Filled 2017-03-20: qty 6

## 2017-03-20 NOTE — ED Provider Notes (Signed)
[facility]Y [facility]PART[facility] Provider Note   CSN: [id number] Arrival date & time: 03/20/17  1559     History   Chief Complaint Chief Complaint  Patient presents with  . Fatigue    HPI Kai Levinsimothy E Helinski is a 56 y.o. male.  HPI  The patient is a 56 year old male, he has a known history of morbid obesity, prostatic hypertrophy, hyperlipidemia, COPD and hypertension.  He has spinal stenosis of the lumbar region, walks with a cane and has diffuse immobility.  He presents to the hospital today from his family doctor's office where he was seen for routine follow-up.  He reports that he has been generally weak, this is been getting much worse over the last week and specifically over the last couple of days.  His significant other reports that he has had some difficulty talking stating that he is very slow to respond but gives the correct answers and is not slurring his speech.  He appears more tired, more fatigued, he has been sleeping most of the day for the last 3 days.  There has been very little to eat by mouth, no diarrhea, no fevers, no coughing.  He does report some fluid retention in his legs.  His family doctor sent him to the hospital for further evaluation.  The patient's wife gives most of the history as the patient appears tired and somnolent though he is easily arousable.  Past Medical History:  Diagnosis Date  . Anxiety   . Arthritis    spine  . COPD (chronic obstructive pulmonary disease) (HCC)    TOLD HE HAS COPD - QUIT SMOKING 15 YRS AGO-- NO C/O OF SOB  . Depression   . Dyslipidemia   . HTN (hypertension)   . Hyperlipidemia   . Pain    CHRONIC NECK PAIN- HX OF ANTERIOR FUSION  . Pain    LOWER BACK WITH PAIN DOWN BOTH LEGS - BUT WORSE LEFT LEG WITH NUMBNESS OUTER SIDE OF LEFT LEG - HAS SPINAL STENOSIS  . Substance abuse (HCC)    quit drinking 15 + years ago    Patient Active Problem List   Diagnosis Date Noted  . Substance abuse in remission (HCC) 09/12/2016    . Morbid obesity (HCC)   . Primary insomnia   . Benign prostatic hyperplasia without lower urinary tract symptoms   . Hyperlipidemia   . Abnormality of gait   . COPD (chronic obstructive pulmonary disease) (HCC)   . HTN (hypertension)   . Spinal stenosis of lumbar region 10/20/2012    Past Surgical History:  Procedure Laterality Date  . "SMALL BODIES REMOVED" FROM BOTH SHOULDERS    . ANTERIOR COMPARTMENT FASCIOTOMY Left 02/19/2016   Performed by Myrene GalasHandy, Michael, MD at [facility]  . BACK SURGERY     LUMBAR LAMINECTOMY  . BILATERAL CARPAL TUNNEL RELEASE    . CERVICAL FUSION    . DECOMPRESSION L2-L3, L1-L2 N/A 10/20/2012   Performed by Javier DockerBeane, Jeffrey C, MD at [facility]  . FRACTURE SURGERY    . OPEN REDUCTION INTERNAL FIXATION (ORIF) TIBIAL PLATEAU Left 02/19/2016   Performed by Myrene GalasHandy, Michael, [facility]  . ORIF TIBIA PLATEAU Left 02/19/2016  . RIGHT ACL RECONSTRUCTION    . SPINE SURGERY         Home Medications    Prior to Admission medications   Medication Sig Start Date End Date Taking? Authorizing Provider  albuterol (PROVENTIL HFA;VENTOLIN HFA) 108 (90 Base) MCG/ACT inhaler Inhale 2 puffs  into the lungs every 6 (six) hours as needed for wheezing or shortness of breath. 01/15/17  Yes Eustace Moore, MD  aspirin EC 81 MG tablet Take 81 mg every evening by mouth.   Yes [provider]  cholecalciferol (VITAMIN D) 400 UNITS TABS Take 400 Units by mouth daily.   Yes [provider]  desvenlafaxine (PRISTIQ) 100 MG 24 hr tablet Take 100 mg by mouth every morning.   Yes [provider]  hydrochlorothiazide (HYDRODIURIL) 25 MG tablet Take 1 tablet (25 mg total) by mouth daily. 08/28/16  Yes Eustace Moore, MD  HYDROcodone-acetaminophen [facility]) 10-325 MG tablet Take 1 tablet by mouth every 8 (eight) hours.  08/08/16  Yes [provider]  lamoTRIgine (LAMICTAL) 100 MG tablet Take 100-200 mg 2 (two) times daily by mouth. TAKES 200 mg in the morning  and 100mg  in the afternoon   Yes [provider]  lisinopril (PRINIVIL,ZESTRIL) 40 MG tablet Take 1 tablet (40 mg total) by mouth daily. 09/23/16  Yes Eustace Moore, MD  methocarbamol (ROBAXIN) 500 MG tablet Take 1 tablet (500 mg total) by mouth every 6 (six) hours as needed for muscle spasms. Patient taking differently: Take 500 mg 3 (three) times daily by mouth.  02/29/16  Yes Angiulli, Mcarthur Rossetti, PA-C  metoprolol succinate (TOPROL-XL) 50 MG 24 hr tablet Take 1 tablet (50 mg total) by mouth daily. Take with or immediately following a meal. 09/23/16  Yes Eustace Moore, MD  mometasone (NASONEX) 50 MCG/ACT nasal spray Place 2 sprays into the nose daily as needed. 08/28/16  Yes Eustace Moore, MD  Multiple Vitamin (MULTIVITAMIN WITH MINERALS) TABS Take 1 tablet by mouth daily.   Yes [provider]  Potassium 99 MG TABS Take 1 tablet daily by mouth.   Yes [provider]  REXULTI 1 MG TABS Take 1 tablet daily by mouth.  01/16/16  Yes [provider]  rosuvastatin (CRESTOR) 40 MG tablet Take 40 mg every evening by mouth.   Yes [provider]  tamsulosin (FLOMAX) 0.4 MG CAPS capsule Take 1 capsule (0.4 mg total) by mouth daily. 09/23/16  Yes Eustace Moore, MD  tiotropium (SPIRIVA) 18 MCG inhalation capsule Place 1 capsule (18 mcg total) into inhaler and inhale daily. 01/15/17 03/20/17 Yes Eustace Moore, MD  zolpidem (AMBIEN) 10 MG tablet Take 10 mg at bedtime by mouth.    Yes [provider]  potassium chloride (K-DUR) 10 MEQ tablet Take 1 tablet (10 mEq total) daily by mouth. 03/20/17   Eber Hong, MD    Family History Family History  Problem Relation Age of Onset  . Clotting disorder Mother   . COPD Mother   . Hypertension Mother   . Early death Mother 47       fracture - blood clot  . Kidney disease Father   . COPD Father   . Stroke Father     Social History Social History   Tobacco Use  . Smoking status:  Former Smoker    Packs/day: 1.00    Years: 20.00    Pack years: 20.00    Types: Cigarettes    Last attempt to quit: 05/05/1996    Years since quitting: 20.8  . Smokeless tobacco: Never Used  . Tobacco comment: QUIT SMOKING ABOUT 15 YRS AGO  Substance Use Topics  . Alcohol use: No  . Drug use: No     Allergies   No known allergies   Review of  Systems Review of Systems  All other systems reviewed and are negative.    Physical Exam Updated Vital Signs BP (!) 144/83   Pulse 97   Temp 98.2 F (36.8 C)   Resp 16   SpO2 96%   Physical Exam  Constitutional: He appears well-developed and well-nourished. No distress.  HENT:  Head: Normocephalic and atraumatic.  Mouth/Throat: Oropharynx is clear and moist. No oropharyngeal exudate.  Eyes: Conjunctivae and EOM are normal. Pupils are equal, round, and reactive to light. Right eye exhibits no discharge. Left eye exhibits no discharge. No scleral icterus.  Neck: Normal range of motion. Neck supple. No JVD present. No thyromegaly present.  Cardiovascular: Normal rate, regular rhythm, normal heart sounds and intact distal pulses. Exam reveals no gallop and no friction rub.  No murmur heard. Pulmonary/Chest: Effort normal and breath sounds normal. No respiratory distress. He has no wheezes. He has no rales.  Lung sounds are clear, no wheezing rhonchi or rales, no increased work of breathing  Abdominal: Soft. Bowel sounds are normal. He exhibits no distension and no mass. There is no tenderness.  Musculoskeletal: Normal range of motion. He exhibits edema ( Scant bilateral symmetrical lower extremity edema). He exhibits no tenderness.  Lymphadenopathy:    He has no cervical adenopathy.  Neurological: He is alert. Coordination normal.  The patient is sleepy but answers all of my questions appropriately.  He does have a significant slowed response as if he is thinking about the answer for 10 or 15 seconds before delivering his response  which is appropriate and without slurred speech.  He follows commands without difficulty including lifting both legs, both arms, no weakness, no lateralizing weakness.  He is able to perform finger-nose-finger without dysmetria bilaterally though he does have a slight tremor in his left upper extremity.  There is no facial droop, cranial nerves III through XII appear normal.  Skin: Skin is warm and dry. No rash noted. No erythema.  Psychiatric: He has a normal mood and affect. His behavior is normal.  Nursing note and vitals reviewed.    ED Treatments / Results  Labs (all labs ordered are listed, but only abnormal results are displayed) Labs Reviewed  CBC WITH DIFFERENTIAL/PLATELET - Abnormal; Notable for the following components:      Result Value   RBC 3.95 (*)    MCV 112.4 (*)    MCH 35.9 (*)    All other components within normal limits  COMPREHENSIVE METABOLIC PANEL - Abnormal; Notable for the following components:   Potassium 2.9 (*)    Chloride 97 (*)    AST 82 (*)    All other components within normal limits  URINALYSIS, ROUTINE W REFLEX MICROSCOPIC - Abnormal; Notable for the following components:   APPearance HAZY (*)    Hgb urine dipstick LARGE (*)    Ketones, ur 20 (*)    Protein, ur 100 (*)    Bacteria, UA RARE (*)    Squamous Epithelial / LPF 0-5 (*)    All other components within normal limits  BLOOD GAS, ARTERIAL - Abnormal; Notable for the following components:   pH, Arterial 7.468 (*)    pO2, Arterial 70.5 (*)    Bicarbonate 28.9 (*)    Acid-Base Excess 5.2 (*)    All other components within normal limits  ETHANOL - Abnormal; Notable for the following components:   Alcohol, Ethyl (B) 150 (*)    All other components within normal limits  I-STAT CG4 LACTIC ACID, ED -  Abnormal; Notable for the following components:   Lactic Acid, Venous 3.26 (*)    All other components within normal limits  I-STAT CG4 LACTIC ACID, ED - Abnormal; Notable for the following  components:   Lactic Acid, Venous 2.55 (*)    All other components within normal limits  URINE CULTURE  CK  TROPONIN I  TSH  BRAIN NATRIURETIC PEPTIDE  CBG MONITORING, ED    EKG  EKG Interpretation None       Radiology Dg Chest 2 View  Result Date: 03/20/2017 CLINICAL DATA:  Shortness of breath and hypoxia EXAM: CHEST  2 VIEW COMPARISON:  02/15/2016 FINDINGS: Mild interstitial coarsening that is similar to prior. Patient has emphysema by CT in 2009. Normal heart size. Negative mediastinal contours. There is no edema, consolidation, effusion, or pneumothorax. Prominent spondylosis. IMPRESSION: Interstitial coarsening likely from patient's COPD. No acute superimposed finding. Electronically Signed   By: Marnee Spring M.D.   On: 03/20/2017 17:24   Ct Head Wo Contrast  Result Date: 03/20/2017 CLINICAL DATA:  Increased weakness for the past 2 months. Altered mental status. Hypotension. EXAM: CT HEAD WITHOUT CONTRAST TECHNIQUE: Contiguous axial images were obtained from the base of the skull through the vertex without intravenous contrast. COMPARISON:  None. FINDINGS: Brain: S Diffusely enlarged ventricles and subarachnoid spaces. Patchy white matter low density in both cerebral hemispheres. No intracranial hemorrhage, mass lesion or CT evidence of acute infarction. Vascular: No hyperdense vessel or unexpected calcification. Skull: Normal. Negative for fracture or focal lesion. Sinuses/Orbits: Unremarkable. Other: None. IMPRESSION: 1. No acute abnormality. 2. Mild diffuse cerebral and cerebellar atrophy. 3. Minimal chronic small vessel white matter ischemic changes in both cerebral hemispheres. Electronically Signed   By: Beckie Salts M.D.   On: 03/20/2017 19:15   Ct Angio Chest Pe W And/or Wo Contrast  Result Date: 03/20/2017 CLINICAL DATA:  Shortness of breath and hypoxia. Increased weakness. Altered mental status for the past 2 months. EXAM: CT ANGIOGRAPHY CHEST WITH CONTRAST  TECHNIQUE: Multidetector CT imaging of the chest was performed using the standard protocol during bolus administration of intravenous contrast. Multiplanar CT image reconstructions and MIPs were obtained to evaluate the vascular anatomy. CONTRAST:  ISOVUE-370 IOPAMIDOL (ISOVUE-370) INJECTION 76% COMPARISON:  Portable chest obtained earlier today. Chest CT dated 04/24/2008, 10/19/2007 and 04/08/2007. FINDINGS: Cardiovascular: Normally opacified pulmonary arteries with no pulmonary arterial filling defects. Atheromatous arterial calcifications, including the thoracic aorta. Mediastinum/Nodes: No enlarged mediastinal, hilar, or axillary lymph nodes. Thyroid gland, trachea, and esophagus demonstrate no significant findings. Lungs/Pleura: Diffuse increase in prominence of the interstitial markings since 04/24/2008. Mild bilateral dependent atelectasis. No pleural fluid. 4 mm left lower lobe nodule on image number 98 series 7 without significant change since 04/08/2007. A previously demonstrated right lower lobe nodule is not currently visualized and a previously demonstrated tiny right middle lobe nodule is also not currently visualized. No new nodules are seen. Mild bilateral bullous changes without significant change. Upper Abdomen: Diffuse low density of the liver relative to the spleen, with progression. Musculoskeletal: Thoracic spine degenerative changes with changes of DISH. Cervical spine fixation hardware. Review of the MIP images confirms the above findings. IMPRESSION: 1. No pulmonary emboli. 2. Stable changes of COPD with the exception of progressive interstitial lung disease. 3. No suspicious lung nodules. 4. Diffuse hepatic steatosis. 5. Mild calcified aortic atherosclerosis. Aortic Atherosclerosis (ICD10-I70.0) and Emphysema (ICD10-J43.9). Electronically Signed   By: Beckie Salts M.D.   On: 03/20/2017 21:17    Procedures Procedures (including critical  care time)  Medications Ordered in  ED Medications  lactated ringers bolus 1,000 mL (not administered)  magnesium sulfate IVPB 2 g 50 mL (0 g Intravenous Stopped 03/20/17 1852)  albuterol (PROVENTIL) (2.5 MG/3ML) 0.083% nebulizer solution 5 mg (5 mg Nebulization Given 03/20/17 1843)  iopamidol (ISOVUE-370) 76 % injection 100 mL (100 mLs Intravenous Contrast Given 03/20/17 2048)  potassium chloride SA (K-DUR,KLOR-CON) CR tablet 40 mEq (40 mEq Oral Given 03/20/17 2147)     Initial Impression / Assessment and Plan / ED Course  I have reviewed the triage vital signs and the nursing notes.  Pertinent labs & imaging results that were available during my care of the patient were reviewed by me and considered in my medical decision making (see chart for details).     At this time the patient will need to undergo further testing including evaluation of his chest for his hypoxia which is at 86-90%.  His lung sounds are clear though I do detect some slight decreased sounds at the right base.  His legs are not significantly swollen or lateralizing and there is edema, his abdomen is soft and nontender, his heart sounds are regular and there is no obvious murmurs.  The patient is agreeable to the workup including labs, x-ray, EKG, urinalysis.  Testing indicates that the patient has been drinking alcohol, this may have been the cause of some of his slowness to respond when he first got here and his mental status has definitely brightened throughout.  His lactic acid was initially over 3, it reduced to around 2.5, he was able to take oral fluids, oral potassium and got some IV fluids as well.  With the patient has no other acute findings on CT scan of the brain or CT angiogram of the chest.  I recommended that the patient be admitted to the hospital because of the elevated lactic acid as well as his unexplained hypoxia but he has refused and wants to go home.  He has been given the indications for return, he was also given precautions and  warnings about leaving AGAINST MEDICAL ADVICE but he was okay with this.  At this time the patient has this medical decision-making capacity, he is able to state to me very clearly that he is leaving against my advice and is able to return at any point.  Final Clinical Impressions(s) / ED Diagnoses   Final diagnoses:  Fatigue, unspecified type  Hypoxia  Hypokalemia  Chronic obstructive pulmonary disease, unspecified COPD type [facility])    ED Discharge Orders        Ordered    potassium chloride (K-DUR) 10 MEQ tablet  Daily     03/20/17 2153       Eber Hong, MD 03/20/17 2154

## 2017-03-20 NOTE — Discharge Instructions (Signed)
We have done an extensive amount of testing to see what has been causing your symptoms including a CT scan of your brain and your chest to make sure you did not have a blood clot or a pneumonia.  We have also looked at your blood work and other than elevated lactic acid and low potassium there has been no specific abnormal findings.  Because of the abnormal findings I have recommended strongly that you be admitted to the hospital.  You have declined and have requested to be discharged home.  You may return at any point should you change your mind or should you feel worsened

## 2017-03-20 NOTE — ED Notes (Signed)
Pt refusing to stay despite his oxygen being 88%-92% on 2L of oxygen. This pt does not usually wear oxygen. Pt appeared sob, with pursed lip breathing upon discharge. This RN once again explained the importance of him coming into the hospital. Again, this RN also explained the importance of pt staying in the hospital. The wife stated that they would follow up with his PCP on Monday. This RN wheeled the pt out to the car, it seemed that the pt was still sob with pursed lip breathing. This RN once again told the pt that if he starts feeling worse to come straight back to the ED. Pt also advised to call EMS if he is really sob. Dr. Hyacinth MeekerMiller had a long discussion with this pt about the benefits and risk of him leaving or staying. Pt stated to his wife, "sign me out." Dr. Hyacinth MeekerMiller advised he would discharge him and for him to come back if he gets worse.

## 2017-03-20 NOTE — Patient Instructions (Signed)
Go to ER Need lab testing today

## 2017-03-20 NOTE — Progress Notes (Signed)
Chief Complaint  Patient presents with  . Fall  . Altered Mental Status  . Fatigue   Patient is here for a follow-up visit.  He is accompanied by his wife Adrian Rivas.  She brought him to the visit because she is worried about his health.  She states that over the last week and a half his health has declined.  He has a poor appetite.  He sleeps all the time.    He has fallen at home 2 or 3 times.  He states that he becomes dizzy if he tries to stand up too quickly.  He has not changed his medications.  He has not had any cough cold or infection.  He has not been injured and his father hit his head.  He is not on any new supplements.  He does appear to have puffy ankles.  He has slow responses in conversation, and appears to be quite sedated.  When he awakens, he seems confused.  During the day this is not as bad.  She works, so she sees him in the morning and evening. He complains that he has more shortness of breath.  He also complains of his heart beating fast when he tries to walk or exert himself. No nausea or vomiting.  No diarrhea or constipation.  No blood in bowels.  No change with urination or urinary stream although he has to get up more at night to urinate. His last blood work was 6 months ago.  He did have impaired renal function at that time with a GFR of 35%.  Patient Active Problem List   Diagnosis Date Noted  . Substance abuse in remission (HCC) 09/12/2016  . Morbid obesity (HCC)   . Primary insomnia   . Benign prostatic hyperplasia without lower urinary tract symptoms   . Hyperlipidemia   . Abnormality of gait   . COPD (chronic obstructive pulmonary disease) (HCC)   . HTN (hypertension)   . Spinal stenosis of lumbar region 10/20/2012    Outpatient Encounter Medications as of 03/20/2017  Medication Sig  . albuterol (PROVENTIL HFA;VENTOLIN HFA) 108 (90 Base) MCG/ACT inhaler Inhale 2 puffs into the lungs every 6 (six) hours as needed for wheezing or shortness of breath.  .  cholecalciferol (VITAMIN D) 400 UNITS TABS Take 400 Units by mouth daily.  Marland Kitchen desvenlafaxine (PRISTIQ) 100 MG 24 hr tablet Take 100 mg by mouth every morning.  . hydrochlorothiazide (HYDRODIURIL) 25 MG tablet Take 1 tablet (25 mg total) by mouth daily.  Marland Kitchen HYDROcodone-acetaminophen (NORCO) 10-325 MG tablet Take 1 tablet by mouth every 8 (eight) hours.   Marland Kitchen lamoTRIgine (LAMICTAL) 100 MG tablet Take 200 mg by mouth every morning. TAKES 200 mg in the morning and 100mg  in the afternoon  . lisinopril (PRINIVIL,ZESTRIL) 40 MG tablet Take 1 tablet (40 mg total) by mouth daily.  . methocarbamol (ROBAXIN) 500 MG tablet Take 1 tablet (500 mg total) by mouth every 6 (six) hours as needed for muscle spasms.  . metoprolol succinate (TOPROL-XL) 50 MG 24 hr tablet Take 1 tablet (50 mg total) by mouth daily. Take with or immediately following a meal.  . mometasone (NASONEX) 50 MCG/ACT nasal spray Place 2 sprays into the nose daily as needed.  . Multiple Vitamin (MULTIVITAMIN WITH MINERALS) TABS Take 1 tablet by mouth daily.  Marland Kitchen REXULTI 1 MG TABS 1 tablet daily.  . rosuvastatin (CRESTOR) 20 MG tablet TAKE ONE TABLET BY MOUTH DAILY.  . tamsulosin (FLOMAX) 0.4 MG CAPS capsule  Take 1 capsule (0.4 mg total) by mouth daily.  Marland Kitchen. zolpidem (AMBIEN) 10 MG tablet Take 10 mg by mouth at bedtime as needed for sleep.  Marland Kitchen. tiotropium (SPIRIVA) 18 MCG inhalation capsule Place 1 capsule (18 mcg total) into inhaler and inhale daily.   No facility-administered encounter medications on file as of 03/20/2017.     Past Medical History:  Diagnosis Date  . Anxiety   . Arthritis    spine  . COPD (chronic obstructive pulmonary disease) (HCC)    TOLD HE HAS COPD - QUIT SMOKING 15 YRS AGO-- NO C/O OF SOB  . Depression   . Dyslipidemia   . HTN (hypertension)   . Hyperlipidemia   . Pain    CHRONIC NECK PAIN- HX OF ANTERIOR FUSION  . Pain    LOWER BACK WITH PAIN DOWN BOTH LEGS - BUT WORSE LEFT LEG WITH NUMBNESS OUTER SIDE OF LEFT LEG -  HAS SPINAL STENOSIS  . Substance abuse (HCC)    quit drinking 15 + years ago    Past Surgical History:  Procedure Laterality Date  . "SMALL BODIES REMOVED" FROM BOTH SHOULDERS    . ANTERIOR COMPARTMENT FASCIOTOMY Left 02/19/2016   Performed by Myrene GalasHandy, Michael, [facility]  . BACK SURGERY     LUMBAR LAMINECTOMY  . BILATERAL CARPAL TUNNEL RELEASE    . CERVICAL FUSION    . DECOMPRESSION L2-L3, L1-L2 N/A 10/20/2012   Performed by Javier DockerBeane, Jeffrey C, [facility]  . FRACTURE SURGERY    . OPEN REDUCTION INTERNAL FIXATION (ORIF) TIBIAL PLATEAU Left 02/19/2016   Performed [facility] GalasHandy, Michael, [facility]  . ORIF TIBIA PLATEAU Left 02/19/2016  . RIGHT ACL RECONSTRUCTION    . SPINE SURGERY      Social History   Socioeconomic History  . Marital status: Married    Spouse name: Adrian LundJanet  . Number of children: 1  . Years of education: 7214  . Highest education level: Not on file  Social Needs  . Financial resource strain: Not on file  . Food insecurity - worry: Not on file  . Food insecurity - inability: Not on file  . Transportation needs - medical: Not on file  . Transportation needs - non-medical: Not on file  Occupational History  . Occupation: disabled    Comment: back and neck    Comment: Film/video editorconstruction surveyor  Tobacco Use  . Smoking status: Former Smoker    Packs/day: 1.00    Years: 20.00    Pack years: 20.00    Types: Cigarettes    Last attempt to quit: 05/05/1996    Years since quitting: 20.8  . Smokeless tobacco: Never Used  . Tobacco comment: QUIT SMOKING ABOUT 15 YRS AGO  Substance and Sexual Activity  . Alcohol use: No  . Drug use: No  . Sexual activity: Not on file  Other Topics Concern  . Not on file  Social History Narrative   Lives with second wife Mardi MainlandJanet   Tiffany is only bio child, she had Lindie SpruceWyatt in Sep 2018   Barbara CowerJason , stepson still at home, unemployed at 30      collects coins    Family History  Problem Relation Age of Onset  . Clotting disorder Mother   .  COPD Mother   . Hypertension Mother   . Early death Mother [id number]       fracture - blood clot  . Kidney disease Father   . COPD Father   .  Stroke Father     Review of Systems  Constitutional: Positive for malaise/fatigue. Negative for chills, fever and weight loss.       Weight gain of 6 pounds  HENT: Negative for congestion and hearing loss.   Eyes: Positive for blurred vision. Negative for pain.  Respiratory: Positive for shortness of breath. Negative for cough.   Cardiovascular: Positive for palpitations and leg swelling. Negative for chest pain.  Gastrointestinal: Negative for abdominal pain, constipation, diarrhea and heartburn.       Decreased appetite  Genitourinary: Positive for frequency. Negative for dysuria.       Nocturia  Musculoskeletal: Positive for back pain. Negative for falls, joint pain and myalgias.       Chronic back pain  Neurological: Positive for dizziness and weakness. Negative for seizures, loss of consciousness and headaches.  Psychiatric/Behavioral: Positive for memory loss. Negative for depression. The patient is not nervous/anxious and does not have insomnia.        Sleeping a lot, slow responses, somnolent    BP 102/64 (BP Location: Right Arm, Patient Position: Sitting, Cuff Size: Large)   Pulse 92   Temp 97.6 F (36.4 C) (Temporal)   Resp 20   Ht 5\' 6"  (1.676 m)   Wt 256 lb 0.6 oz (116.1 kg)   SpO2 90%   BMI 41.33 kg/m   Physical Exam  Constitutional: He appears well-developed.  Over nourished, obese  HENT:  Head: Normocephalic and atraumatic.  Right Ear: External ear normal.  Left Ear: External ear normal.  Mouth/Throat: Oropharynx is clear and moist.  Eyes: Conjunctivae are normal. Pupils are equal, round, and reactive to light.  Neck: Normal range of motion. No JVD present. No thyromegaly present.  Cardiovascular: Normal rate, regular rhythm and normal heart sounds.  EKG normal  Pulmonary/Chest: Effort normal and breath sounds normal.  He has no rales.  Faint rales at bases  Abdominal: Soft. Bowel sounds are normal. There is no tenderness.  Musculoskeletal: He exhibits edema.  Trace edema.  Slow cap refill  Neurological:  Slow responses  Skin: Capillary refill takes more than 3 seconds.  Psychiatric: His affect is blunt. His speech is delayed. He is slowed and withdrawn. Cognition and memory are impaired. He is inattentive.   ASSESSMENT/PLAN:  1. Somnolence I have concern regarding his chronic narcotic use, and somnolence.  He has multiple psychiatric diagnoses and medications.  On his last blood work he had a GFR 35%, and if he is not eating or drinking he could have impaired renal function that is making his medication buildup.  I am unable to do lab work and get back this afternoon.  I have recommended that he go to the emergency room to be evaluated for mental status changes.  2. Fatigue, unspecified type - EKG 12-Lead - POCT CBG (Fasting - Glucose)  3. Pedal edema  4. Poor appetite  5. Orthostatic hypotension  6. Recurrent falls    Patient Instructions  Go to ER Need lab testing today   Eustace MooreYvonne Sue Sharhonda Atwood, MD

## 2017-03-20 NOTE — ED Triage Notes (Signed)
Weakness, feeling bad for 2 months

## 2017-03-20 NOTE — ED Notes (Signed)
Patient transported to CT 

## 2017-03-20 NOTE — ED Triage Notes (Signed)
Increase in weakness, altered mental status, frequent night urination, retaining fluid and low BP.per wife

## 2017-03-22 LAB — URINE CULTURE: Culture: <10000 — AB

## 2017-03-30 ENCOUNTER — Telehealth: Payer: Self-pay | Admitting: *Deleted

## 2017-03-30 NOTE — Telephone Encounter (Signed)
Patient's wife called back stating patient also wants oxygen, patient's wife made him an appointment for Wednesday unless these can be filled. Wife stated she is unsure what Dr Delton SeeNelson will want to do. Please advise

## 2017-03-30 NOTE — Telephone Encounter (Signed)
It is a good idea for him to come in

## 2017-03-30 NOTE — Telephone Encounter (Signed)
Patient called about a nebulizer, patient states Dr Delton SeeNelson put him on a medication that is not working, and he used his neighbor's nebulizer and it helped him. Patient also requested his lab results from when Dr Delton SeeNelson sent him to the hospital. Please advise

## 2017-03-30 NOTE — Telephone Encounter (Signed)
Patient's wife left message wanting to hear something back, I called the number provided and home number and left a message and made aware we are waiting on Dr Delton SeeNelson to get back with us regarding refill.

## 2017-03-30 NOTE — Telephone Encounter (Signed)
Do you want to rx a neb. For him?

## 2017-04-01 ENCOUNTER — Encounter: Payer: Self-pay | Admitting: Family Medicine

## 2017-04-01 ENCOUNTER — Other Ambulatory Visit: Payer: Self-pay

## 2017-04-01 ENCOUNTER — Ambulatory Visit (INDEPENDENT_AMBULATORY_CARE_PROVIDER_SITE_OTHER): Payer: Commercial Managed Care - PPO | Admitting: Family Medicine

## 2017-04-01 VITALS — BP 104/68 | HR 80 | Temp 96.7°F | Resp 20 | Ht 66.0 in | Wt 251.1 lb

## 2017-04-01 DIAGNOSIS — J439 Emphysema, unspecified: Secondary | ICD-10-CM | POA: Diagnosis not present

## 2017-04-01 DIAGNOSIS — R0902 Hypoxemia: Secondary | ICD-10-CM

## 2017-04-01 DIAGNOSIS — R400 Somnolence: Secondary | ICD-10-CM | POA: Diagnosis not present

## 2017-04-01 LAB — CBC
HEMATOCRIT: 41.5 % (ref 38.5–50.0)
Hemoglobin: 15.1 g/dL (ref 13.2–17.1)
MCH: 36.5 pg — ABNORMAL HIGH (ref 27.0–33.0)
MCHC: 36.4 g/dL — AB (ref 32.0–36.0)
MCV: 100.2 fL — ABNORMAL HIGH (ref 80.0–100.0)
MPV: 9.3 fL (ref 7.5–12.5)
Platelets: 227 10*3/uL (ref 140–400)
RBC: 4.14 10*6/uL — ABNORMAL LOW (ref 4.20–5.80)
RDW: 13.3 % (ref 11.0–15.0)
WBC: 7.8 10*3/uL (ref 3.8–10.8)

## 2017-04-01 LAB — BASIC METABOLIC PANEL WITH GFR
BUN / CREAT RATIO: 10 (calc) (ref 6–22)
BUN: 17 mg/dL (ref 7–25)
CO2: 34 mmol/L — ABNORMAL HIGH (ref 20–32)
CREATININE: 1.69 mg/dL — AB (ref 0.70–1.33)
Calcium: 10.5 mg/dL — ABNORMAL HIGH (ref 8.6–10.3)
Chloride: 81 mmol/L — ABNORMAL LOW (ref 98–110)
GFR, Est African American: 51 mL/min/{1.73_m2} — ABNORMAL LOW (ref >=60)
GFR, Est Non African American: 44 mL/min/{1.73_m2} — ABNORMAL LOW (ref >=60)
GLUCOSE: 106 mg/dL (ref 65–139)
POTASSIUM: 2.9 mmol/L — AB (ref 3.5–5.3)
SODIUM: 130 mmol/L — AB (ref 135–146)

## 2017-04-01 MED ORDER — UNABLE TO FIND: 0 refills | Status: DC

## 2017-04-01 MED ORDER — IPRATROPIUM-ALBUTEROL 0.5-2.5 (3) MG/3ML IN SOLN: 3.0000 mL | RESPIRATORY_TRACT | 11 refills | Status: AC | PRN

## 2017-04-01 MED ORDER — IPRATROPIUM-ALBUTEROL 0.5-2.5 (3) MG/3ML IN SOLN: 3.0000 mL | Freq: Four times a day (QID) | RESPIRATORY_TRACT | 5 refills | Status: DC | PRN

## 2017-04-01 NOTE — Progress Notes (Signed)
Chief Complaint  Patient presents with  . Shortness of Breath   Patient was in the emergency room a week and half ago.  He had a CT of his chest.  It showed COPD and pulmonary fibrosis.  He had hypoxia.  He had abnormal lab test.  He left AMA.  He is back here today complaining of increased shortness of breath.  No fever or chills.  No sputum.  He is on Spiriva.  He has albuterol to use as needed.  He borrowed a nebulizer and states it works better than his inhaler.  He is here requesting a nebulizer. Upon entering the office he appears dyspneic.  His O2 initially after walking from the lobby to the room was 75%.  After sitting quietly for 10 minutes it came up to 87/88%.  He had hypoxia in the emergency room.  He is here requesting a nebulizer but I let him know he needs a nebulizer, pulmonary consult, and home oxygen given his persistent hypoxia.  I told him it is especially important to wear his oxygen at night.  Patient Active Problem List   Diagnosis Date Noted  . Substance abuse in remission (HCC) 09/12/2016  . Morbid obesity (HCC)   . Primary insomnia   . Benign prostatic hyperplasia without lower urinary tract symptoms   . Hyperlipidemia   . Abnormality of gait   . COPD (chronic obstructive pulmonary disease) (HCC)   . HTN (hypertension)   . Spinal stenosis of lumbar region 10/20/2012    Outpatient Encounter Medications as of 04/01/2017  Medication Sig  . albuterol (PROVENTIL HFA;VENTOLIN HFA) 108 (90 Base) MCG/ACT inhaler Inhale 2 puffs into the lungs every 6 (six) hours as needed for wheezing or shortness of breath.  Marland Kitchen. aspirin EC 81 MG tablet Take 81 mg every evening by mouth.  . cholecalciferol (VITAMIN D) 400 UNITS TABS Take 400 Units by mouth daily.  Marland Kitchen. desvenlafaxine (PRISTIQ) 100 MG 24 hr tablet Take 100 mg by mouth every morning.  . hydrochlorothiazide (HYDRODIURIL) 25 MG tablet Take 1 tablet (25 mg total) by mouth daily.  Marland Kitchen. HYDROcodone-acetaminophen (NORCO) 10-325 MG  tablet Take 1 tablet by mouth every 8 (eight) hours.   Marland Kitchen. lamoTRIgine (LAMICTAL) 100 MG tablet Take 100-200 mg 2 (two) times daily by mouth. TAKES 200 mg in the morning and 100mg  in the afternoon  . lisinopril (PRINIVIL,ZESTRIL) 40 MG tablet Take 1 tablet (40 mg total) by mouth daily.  . methocarbamol (ROBAXIN) 500 MG tablet Take 1 tablet (500 mg total) by mouth every 6 (six) hours as needed for muscle spasms. (Patient taking differently: Take 500 mg 3 (three) times daily by mouth. )  . metoprolol succinate (TOPROL-XL) 50 MG 24 hr tablet Take 1 tablet (50 mg total) by mouth daily. Take with or immediately following a meal.  . mometasone (NASONEX) 50 MCG/ACT nasal spray Place 2 sprays into the nose daily as needed.  . Multiple Vitamin (MULTIVITAMIN WITH MINERALS) TABS Take 1 tablet by mouth daily.  . Potassium 99 MG TABS Take 1 tablet daily by mouth.  . potassium chloride (K-DUR) 10 MEQ tablet Take 1 tablet (10 mEq total) daily by mouth.  Marland Kitchen. REXULTI 1 MG TABS Take 1 tablet daily by mouth.   . rosuvastatin (CRESTOR) 40 MG tablet Take 40 mg every evening by mouth.  . tamsulosin (FLOMAX) 0.4 MG CAPS capsule Take 1 capsule (0.4 mg total) by mouth daily.  Marland Kitchen. zolpidem (AMBIEN) 10 MG tablet Take 10 mg at  bedtime by mouth.   Marland Kitchen ipratropium-albuterol (DUONEB) 0.5-2.5 (3) MG/3ML SOLN Take 3 mLs by nebulization every 4 (four) hours as needed.  Marland Kitchen ipratropium-albuterol (DUONEB) 0.5-2.5 (3) MG/3ML SOLN Take 3 mLs by nebulization every 6 (six) hours as needed.  . tiotropium (SPIRIVA) 18 MCG inhalation capsule Place 1 capsule (18 mcg total) into inhaler and inhale daily.  Marland Kitchen UNABLE TO FIND Nebulizer machine and supplies  Dx dyspnea  . UNABLE TO FIND Home 02, concentrator - 2-4 lpm via n/c to maintain o2 sat 90% or above.  Dx : COPD,HYPOXIA   No facility-administered encounter medications on file as of 04/01/2017.     Allergies  Allergen Reactions  . No Known Allergies     Review of Systems  Constitutional:  Negative for activity change, appetite change and unexpected weight change.  HENT: Negative for congestion and dental problem.        Regular dental care  Eyes: Negative for photophobia and visual disturbance.       Sees Eye doctor yearly  Respiratory: Positive for shortness of breath and wheezing. Negative for cough.        Poor exercise tolerance.  Increased SOB  Cardiovascular: Negative for chest pain, palpitations and leg swelling.  Gastrointestinal: Negative for blood in stool, constipation and diarrhea.  Genitourinary: Negative for difficulty urinating and frequency.  Musculoskeletal: Positive for arthralgias, back pain and gait problem.  Neurological: Negative for dizziness, facial asymmetry and speech difficulty.  Psychiatric/Behavioral: Positive for confusion. Negative for behavioral problems, decreased concentration and sleep disturbance. The patient is not nervous/anxious.        Controlled anxiety and depression.  Confusion in the morning is likely due to hypoxia, hypoventilation at night.    BP 104/68 (BP Location: Left Arm, Patient Position: Sitting, Cuff Size: Large)   Pulse 80   Temp (!) 96.7 F (35.9 C) (Temporal)   Resp 20   Ht 5\' 6"  (1.676 m)   Wt 251 lb 1.9 oz (113.9 kg)   SpO2 (!) 88%   BMI 40.53 kg/m   Physical Exam  Constitutional: He appears well-developed.  Over nourished, obese.  Mildly somnolent  HENT:  Head: Normocephalic and atraumatic.  Right Ear: External ear normal.  Left Ear: External ear normal.  Mouth/Throat: Oropharynx is clear and moist.  Eyes: Conjunctivae are normal. Pupils are equal, round, and reactive to light.  Neck: Normal range of motion. No JVD present. No thyromegaly present.  Cardiovascular: Normal rate, regular rhythm and normal heart sounds.  Pulmonary/Chest: Effort normal. He has decreased breath sounds.  Faint rales at bases  Abdominal: Soft. Bowel sounds are normal. There is no tenderness.  Musculoskeletal: He exhibits  edema.  Trace edema.    Neurological:  Slow responses  Skin: Capillary refill takes 2 to 3 seconds.  Psychiatric: His affect is blunt. His speech is delayed. He is slowed. Cognition and memory are not impaired.    ASSESSMENT/PLAN:  1. Pulmonary emphysema, unspecified emphysema type (HCC) Recent CT scan of the chest in the emergency room did demonstrate COPD with increased fibrosis - DME Nebulizer machine - For home use only DME oxygen - Ambulatory referral to Pulmonology - BASIC METABOLIC PANEL WITH GFR - CBC  2. Hypoxia Home oxygen - DME Nebulizer machine - For home use only DME oxygen - Ambulatory referral to Pulmonology  3. Somnolence Likely hypoxia.  Obesity and activity poor physical condition, medications - DME Nebulizer machine - For home use only DME oxygen - Ambulatory referral to Pulmonology  Patient Instructions  Home oxygen Use all the time that you are able Duo neb nebulizer 4 X a day See pulmonary medicine ASAP I have placed referral You can go back to Dr Sherene SiresWert or see Dr Juanetta GoslingHawkins in [place]  See me in 2-3 weeks  Labs today to follow up on abnormal labs from ER    Eustace MooreYvonne Sue Keionna Kinnaird, MD

## 2017-04-01 NOTE — Patient Instructions (Signed)
Home oxygen Use all the time that you are able Duo neb nebulizer 4 X a day See pulmonary medicine ASAP I have placed referral You can go back to Dr Sherene SiresWert or see Dr Juanetta GoslingHawkins in [place]  See me in 2-3 weeks  Labs today to follow up on abnormal labs from ER

## 2017-04-02 ENCOUNTER — Telehealth: Payer: Self-pay

## 2017-04-02 ENCOUNTER — Other Ambulatory Visit: Payer: Self-pay

## 2017-04-02 MED ORDER — POTASSIUM CHLORIDE ER 10 MEQ PO TBCR: 10.0000 meq | EXTENDED_RELEASE_TABLET | Freq: Every day | ORAL | 0 refills | Status: DC

## 2017-04-02 NOTE — Telephone Encounter (Signed)
-----   Message from Eustace MooreYvonne Sue Nelson, MD sent at 04/02/2017 10:25 AM EST ----- Call tim, push fluids, refill his potassium Rx from the ER.  If feels worse go to ER.

## 2017-04-02 NOTE — Telephone Encounter (Signed)
-----   Message from Yvonne Sue Nelson, MD sent at 04/02/2017 10:25 AM EST ----- Call tim, push fluids, refill his potassium Rx from the ER.  If feels worse go to ER. 

## 2017-04-02 NOTE — Telephone Encounter (Signed)
Called and spoke to wife as Adrian Rivas did not answer the phone. His home o2 was delivered yesterday, along with his nebulizer. rx for k sent to pharmacy, janet aware. Aware if he starts feeling worse he needs to go to the ED, states understanding.

## 2017-04-16 ENCOUNTER — Encounter: Payer: Self-pay | Admitting: Family Medicine

## 2017-04-16 ENCOUNTER — Ambulatory Visit (INDEPENDENT_AMBULATORY_CARE_PROVIDER_SITE_OTHER): Payer: Commercial Managed Care - PPO | Admitting: Family Medicine

## 2017-04-16 ENCOUNTER — Other Ambulatory Visit: Payer: Self-pay

## 2017-04-16 VITALS — BP 74/50 | HR 112 | Temp 96.6°F | Resp 24 | Ht 66.0 in | Wt 244.0 lb

## 2017-04-16 DIAGNOSIS — R630 Anorexia: Secondary | ICD-10-CM | POA: Diagnosis not present

## 2017-04-16 DIAGNOSIS — R0602 Shortness of breath: Secondary | ICD-10-CM

## 2017-04-16 DIAGNOSIS — R0902 Hypoxemia: Secondary | ICD-10-CM

## 2017-04-16 DIAGNOSIS — J439 Emphysema, unspecified: Secondary | ICD-10-CM | POA: Diagnosis not present

## 2017-04-16 DIAGNOSIS — I952 Hypotension due to drugs: Secondary | ICD-10-CM

## 2017-04-16 DIAGNOSIS — R400 Somnolence: Secondary | ICD-10-CM | POA: Diagnosis not present

## 2017-04-16 MED ORDER — TAMSULOSIN HCL 0.4 MG PO CAPS: 0.8000 mg | ORAL_CAPSULE | Freq: Every day | ORAL | 3 refills | Status: AC

## 2017-04-16 NOTE — Patient Instructions (Signed)
Drink plenty of water Stop the lisinopril Take BP daily Double the flomax I have ordered an echocardiogram If BP stays low one week after stopping the lisinopril, then cut metoprolol in half Want BP over 100/70 but under 150/90  See pulmonary med next week  See me week after

## 2017-04-16 NOTE — Progress Notes (Signed)
Chief Complaint  Patient presents with  . Follow-up    2 week   Patient is back for follow-up in 2 weeks. He feels slightly better.  His color is better. His appetite is still quite poor.  He has lost 6 pounds in 2 weeks. He is using his nebulizer 2-3 times a day.  His breathing is "slightly" improved.  It does make him jittery He is on 3 blood pressure medications.  Lisinopril 40 a day, metoprolol 50 a day, and hydrochlorothiazide 25 mg daily.  His blood pressure last time was low at 104/60, today it is even lower barely audible at 76/50.  We need to reduce his blood pressure medication.  I am not sure why he is so hypotensive.  He is more sluggish and states he gets dizzy when he stands up quickly.  He is told to increase his hydration, stop his lisinopril, and after 1 week if he still has low blood pressure he needs to cut his Toprol in half.  I am leaving the hydrochlorothiazide just because he has edema but this may need to go too. I discussed with patient and wife my concern over his declining health.  He has an appointment with pulmonary for next week.  I am going to order an echocardiogram. His last blood work was abnormal with a low sodium, low potassium, mildly high calcium.  He had a elevated MCV.  I am repeating this today.  I would add a BNP just because I am concerned about his hypotension, tachycardia, and difficulty breathing.  His last BnP was normal.  Patient Active Problem List   Diagnosis Date Noted  . Substance abuse in remission (HCC) 09/12/2016  . Morbid obesity (HCC)   . Primary insomnia   . Benign prostatic hyperplasia without lower urinary tract symptoms   . Hyperlipidemia   . Abnormality of gait   . COPD (chronic obstructive pulmonary disease) (HCC)   . HTN (hypertension)   . Spinal stenosis of lumbar region 10/20/2012    Outpatient Encounter Medications as of 04/16/2017  Medication Sig  . albuterol (PROVENTIL HFA;VENTOLIN HFA) 108 (90 Base) MCG/ACT  inhaler Inhale 2 puffs into the lungs every 6 (six) hours as needed for wheezing or shortness of breath.  Marland Kitchen. aspirin EC 81 MG tablet Take 81 mg every evening by mouth.  . cholecalciferol (VITAMIN D) 400 UNITS TABS Take 400 Units by mouth daily.  Marland Kitchen. desvenlafaxine (PRISTIQ) 100 MG 24 hr tablet Take 100 mg by mouth every morning.  . hydrochlorothiazide (HYDRODIURIL) 25 MG tablet Take 1 tablet (25 mg total) by mouth daily.  Marland Kitchen. HYDROcodone-acetaminophen (NORCO) 10-325 MG tablet Take 1 tablet by mouth every 8 (eight) hours.   Marland Kitchen. ipratropium-albuterol (DUONEB) 0.5-2.5 (3) MG/3ML SOLN Take 3 mLs by nebulization every 4 (four) hours as needed.  Marland Kitchen. ipratropium-albuterol (DUONEB) 0.5-2.5 (3) MG/3ML SOLN Take 3 mLs by nebulization every 6 (six) hours as needed.  . lamoTRIgine (LAMICTAL) 100 MG tablet Take 100-200 mg 2 (two) times daily by mouth. TAKES 200 mg in the morning and 100mg  in the afternoon  . methocarbamol (ROBAXIN) 500 MG tablet Take 1 tablet (500 mg total) by mouth every 6 (six) hours as needed for muscle spasms. (Patient taking differently: Take 500 mg 3 (three) times daily by mouth. )  . metoprolol succinate (TOPROL-XL) 50 MG 24 hr tablet Take 1 tablet (50 mg total) by mouth daily. Take with or immediately following a meal.  . mometasone (NASONEX) 50 MCG/ACT nasal  spray Place 2 sprays into the nose daily as needed.  . Multiple Vitamin (MULTIVITAMIN WITH MINERALS) TABS Take 1 tablet by mouth daily.  . Potassium 99 MG TABS Take 1 tablet daily by mouth.  . potassium chloride (K-DUR) 10 MEQ tablet Take 1 tablet (10 mEq total) by mouth daily.  Marland Kitchen REXULTI 1 MG TABS Take 1 tablet daily by mouth.   . rosuvastatin (CRESTOR) 40 MG tablet Take 40 mg every evening by mouth.  . tamsulosin (FLOMAX) 0.4 MG CAPS capsule Take 2 capsules (0.8 mg total) by mouth daily.  Marland Kitchen UNABLE TO FIND Nebulizer machine and supplies  Dx dyspnea  . UNABLE TO FIND Home 02, concentrator - 2-4 lpm via n/c to maintain o2 sat 90% or  above.  Dx : COPD,HYPOXIA  . zolpidem (AMBIEN) 10 MG tablet Take 10 mg at bedtime by mouth.   . tiotropium (SPIRIVA) 18 MCG inhalation capsule Place 1 capsule (18 mcg total) into inhaler and inhale daily.   No facility-administered encounter medications on file as of 04/16/2017.     Allergies  Allergen Reactions  . No Known Allergies     Review of Systems  Constitutional: Positive for appetite change and unexpected weight change. Negative for activity change.  HENT: Negative for congestion and dental problem.   Eyes: Negative for photophobia and visual disturbance.  Respiratory: Positive for shortness of breath and wheezing. Negative for cough.        Poor exercise tolerance.  Increased SOB  Cardiovascular: Negative for chest pain, palpitations and leg swelling.  Gastrointestinal: Negative for blood in stool, constipation and diarrhea.  Genitourinary: Negative for difficulty urinating and frequency.  Musculoskeletal: Positive for arthralgias, back pain and gait problem.       Take narcotics for chronic pain  Neurological: Negative for dizziness, facial asymmetry and speech difficulty.  Psychiatric/Behavioral: Positive for confusion. Negative for behavioral problems, decreased concentration and sleep disturbance. The patient is not nervous/anxious.        Controlled anxiety and depression.  Concern regarding his medications and somnolence.  He has little facial expression    BP (!) 74/50   Pulse (!) 112   Temp (!) 96.6 F (35.9 C) (Temporal)   Resp (!) 24   Ht 5\' 6"  (1.676 m)   Wt 244 lb 0.6 oz (110.7 kg)   SpO2 (!) 85% Comment: 85-89 with ambulation.on room air,  BMI 39.39 kg/m   Physical Exam  Constitutional: He appears well-developed.  Over nourished, obese.  Mildly somnolent  HENT:  Head: Normocephalic and atraumatic.  Mouth/Throat: Oropharynx is clear and moist.  Eyes: Conjunctivae are normal. Pupils are equal, round, and reactive to light.  Neck: Normal range of  motion. No JVD present. No thyromegaly present.  Cardiovascular: Normal rate, regular rhythm and normal heart sounds.  Pulmonary/Chest: Effort normal. He has decreased breath sounds.  Faint rales at bases  Abdominal: Soft. Bowel sounds are normal. There is no tenderness.  Musculoskeletal: He exhibits edema.  Trace edema.    Neurological:  Slow responses  Psychiatric: His affect is blunt. His speech is delayed. He is slowed. Cognition and memory are not impaired.    ASSESSMENT/PLAN:  1. Pulmonary emphysema, unspecified emphysema type (HCC) Abnormal CAT scan.  To see pulmonology  2. Somnolence Medications, hypoxia, unclear illness - COMPLETE METABOLIC PANEL WITH GFR - CBC with Differential/Platelet  3. Hypoxia Improved - ECHOCARDIOGRAM COMPLETE; Future  4. Poor appetite Weight loss of 6 pounds in 2 weeks  5. Shortness of breath Mildly  improved.  Only mildly responsive to DuoNeb nebulized treatment - B Nat Peptide - ECHOCARDIOGRAM COMPLETE; Future  6. Hypotension due to drugs Very low blood pressure.  Orthostatic symptoms.  I am going to reduce his blood pressure medication and follow.  Wife has a blood pressure cuff at home and is discussed with her what parameters are normal, and when to call the office. - ECHOCARDIOGRAM COMPLETE; Future   Patient Instructions  Drink plenty of water Stop the lisinopril Take BP daily Double the flomax I have ordered an echocardiogram If BP stays low one week after stopping the lisinopril, then cut metoprolol in half Want BP over 100/70 but under 150/90  See pulmonary med next week  See me week after   Eustace MooreYvonne Sue Abie Cheek, MD

## 2017-04-17 ENCOUNTER — Telehealth: Payer: Self-pay | Admitting: Family Medicine

## 2017-04-17 ENCOUNTER — Other Ambulatory Visit: Payer: Self-pay

## 2017-04-17 ENCOUNTER — Emergency Department (HOSPITAL_COMMUNITY): Payer: Commercial Managed Care - PPO

## 2017-04-17 ENCOUNTER — Inpatient Hospital Stay (HOSPITAL_COMMUNITY): Payer: Commercial Managed Care - PPO

## 2017-04-17 ENCOUNTER — Encounter (HOSPITAL_COMMUNITY): Payer: Self-pay | Admitting: Emergency Medicine

## 2017-04-17 ENCOUNTER — Inpatient Hospital Stay (HOSPITAL_COMMUNITY)
Admission: EM | Admit: 2017-04-17 | Discharge: 2017-04-19 | DRG: 315 | Disposition: A | Discharge status: Home Health | Payer: Commercial Managed Care - PPO | Attending: Family Medicine | Admitting: Family Medicine

## 2017-04-17 DIAGNOSIS — I959 Hypotension, unspecified: Secondary | ICD-10-CM | POA: Diagnosis present

## 2017-04-17 DIAGNOSIS — Z87891 Personal history of nicotine dependence: Secondary | ICD-10-CM

## 2017-04-17 DIAGNOSIS — D539 Nutritional anemia, unspecified: Secondary | ICD-10-CM | POA: Diagnosis present

## 2017-04-17 DIAGNOSIS — G8929 Other chronic pain: Secondary | ICD-10-CM | POA: Diagnosis present

## 2017-04-17 DIAGNOSIS — I952 Hypotension due to drugs: Secondary | ICD-10-CM | POA: Diagnosis not present

## 2017-04-17 DIAGNOSIS — Z825 Family history of asthma and other chronic lower respiratory diseases: Secondary | ICD-10-CM | POA: Diagnosis not present

## 2017-04-17 DIAGNOSIS — E876 Hypokalemia: Secondary | ICD-10-CM | POA: Diagnosis present

## 2017-04-17 DIAGNOSIS — R296 Repeated falls: Secondary | ICD-10-CM | POA: Diagnosis present

## 2017-04-17 DIAGNOSIS — J449 Chronic obstructive pulmonary disease, unspecified: Secondary | ICD-10-CM | POA: Diagnosis present

## 2017-04-17 DIAGNOSIS — I10 Essential (primary) hypertension: Secondary | ICD-10-CM | POA: Diagnosis present

## 2017-04-17 DIAGNOSIS — E785 Hyperlipidemia, unspecified: Secondary | ICD-10-CM | POA: Diagnosis present

## 2017-04-17 DIAGNOSIS — E872 Acidosis, unspecified: Secondary | ICD-10-CM

## 2017-04-17 DIAGNOSIS — A419 Sepsis, unspecified organism: Secondary | ICD-10-CM

## 2017-04-17 DIAGNOSIS — Z981 Arthrodesis status: Secondary | ICD-10-CM | POA: Diagnosis not present

## 2017-04-17 DIAGNOSIS — Z79899 Other long term (current) drug therapy: Secondary | ICD-10-CM

## 2017-04-17 DIAGNOSIS — N179 Acute kidney failure, unspecified: Secondary | ICD-10-CM | POA: Diagnosis present

## 2017-04-17 HISTORY — DX: Acute kidney failure, unspecified: N17.9

## 2017-04-17 LAB — CBC WITH DIFFERENTIAL/PLATELET
BASOS ABS: 0.1 10*3/uL (ref 0.0–0.1)
BASOS ABS: 0.1 10*3/uL (ref 0.0–0.1)
BASOS PCT: 0.9 %
BASOS PCT: 1 %
Basophils Absolute: 106 cells/uL (ref 0–200)
Basophils Relative: 1 %
EOS ABS: 0.3 10*3/uL (ref 0.0–0.7)
EOS ABS: 0.3 10*3/uL (ref 0.0–0.7)
EOS PCT: 1.8 %
EOS PCT: 3 %
EOS PCT: 4 %
Eosinophils Absolute: 212 cells/uL (ref 15–500)
HCT: 43.1 % (ref 38.5–50.0)
HCT: 44.9 % (ref 39.0–52.0)
HCT: 40.1 % (ref 39.0–52.0)
Hemoglobin: 15.2 g/dL (ref 13.2–17.1)
Hemoglobin: 14.5 g/dL (ref 13.0–17.0)
Hemoglobin: 13.1 g/dL (ref 13.0–17.0)
LYMPHS PCT: 25 %
Lymphocytes Relative: 25 %
Lymphs Abs: 2159 cells/uL (ref 850–3900)
Lymphs Abs: 2.3 10*3/uL (ref 0.7–4.0)
Lymphs Abs: 1.8 10*3/uL (ref 0.7–4.0)
MCH: 36.1 pg — ABNORMAL HIGH (ref 27.0–33.0)
MCH: 35.4 pg — ABNORMAL HIGH (ref 26.0–34.0)
MCH: 35.7 pg — AB (ref 26.0–34.0)
MCHC: 35.3 g/dL (ref 32.0–36.0)
MCHC: 32.3 g/dL (ref 30.0–36.0)
MCHC: 32.7 g/dL (ref 30.0–36.0)
MCV: 102.4 fL — AB (ref 80.0–100.0)
MCV: 109.5 fL — ABNORMAL HIGH (ref 78.0–100.0)
MCV: 109.3 fL — AB (ref 78.0–100.0)
MONO ABS: 1 10*3/uL (ref 0.1–1.0)
MONO ABS: 0.7 10*3/uL (ref 0.1–1.0)
MONOS PCT: 10 %
MPV: 10 fL (ref 7.5–12.5)
Monocytes Relative: 11 %
Monocytes Relative: 9 %
NEUTROS PCT: 69 %
Neutro Abs: 8142 cells/uL — ABNORMAL HIGH (ref 1500–7800)
Neutro Abs: 5.5 10*3/uL (ref 1.7–7.7)
Neutro Abs: 4.4 10*3/uL (ref 1.7–7.7)
Neutrophils Relative %: 60 %
Neutrophils Relative %: 61 %
PLATELETS: 264 10*3/uL (ref 140–400)
PLATELETS: 224 10*3/uL (ref 150–400)
PLATELETS: 159 10*3/uL (ref 150–400)
RBC: 4.21 10*6/uL (ref 4.20–5.80)
RBC: 4.1 MIL/uL — AB (ref 4.22–5.81)
RBC: 3.67 MIL/uL — ABNORMAL LOW (ref 4.22–5.81)
RDW: 12.7 % (ref 11.0–15.0)
RDW: 13.5 % (ref 11.5–15.5)
RDW: 13.4 % (ref 11.5–15.5)
TOTAL LYMPHOCYTE: 18.3 %
WBC: 11.8 10*3/uL — AB (ref 3.8–10.8)
WBC: 9.1 10*3/uL (ref 4.0–10.5)
WBC: 7.2 10*3/uL (ref 4.0–10.5)
WBCMIX: 1180 {cells}/uL — AB (ref 200–950)

## 2017-04-17 LAB — URINALYSIS, ROUTINE W REFLEX MICROSCOPIC
Bilirubin Urine: NEGATIVE
GLUCOSE, UA: 100 mg/dL — AB
Hgb urine dipstick: NEGATIVE
Ketones, ur: NEGATIVE mg/dL
LEUKOCYTES UA: NEGATIVE
Nitrite: NEGATIVE
PH: 5.5 (ref 5.0–8.0)
Protein, ur: NEGATIVE mg/dL
Specific Gravity, Urine: 1.01 (ref 1.005–1.030)

## 2017-04-17 LAB — COMPREHENSIVE METABOLIC PANEL
ALBUMIN: 4 g/dL (ref 3.5–5.0)
ALT: 39 U/L (ref 17–63)
AST: 73 U/L — AB (ref 15–41)
Alkaline Phosphatase: 95 U/L (ref 38–126)
Anion gap: 16 — ABNORMAL HIGH (ref 5–15)
BUN: 21 mg/dL — AB (ref 6–20)
CHLORIDE: 85 mmol/L — AB (ref 101–111)
CO2: 34 mmol/L — AB (ref 22–32)
Calcium: 10.9 mg/dL — ABNORMAL HIGH (ref 8.9–10.3)
Creatinine, Ser: 2.47 mg/dL — ABNORMAL HIGH (ref 0.61–1.24)
GFR calc Af Amer: 31 mL/min — ABNORMAL LOW (ref >60)
GFR, EST NON AFRICAN AMERICAN: 27 mL/min — AB (ref >60)
GLUCOSE: 100 mg/dL — AB (ref 65–99)
POTASSIUM: 2.5 mmol/L — AB (ref 3.5–5.1)
SODIUM: 135 mmol/L (ref 135–145)
Total Bilirubin: 1 mg/dL (ref 0.3–1.2)
Total Protein: 7 g/dL (ref 6.5–8.1)

## 2017-04-17 LAB — I-STAT CHEM 8, ED
BUN: 19 mg/dL (ref 6–20)
CHLORIDE: 86 mmol/L — AB (ref 101–111)
Calcium, Ion: 1.19 mmol/L (ref 1.15–1.40)
Creatinine, Ser: 2.5 mg/dL — ABNORMAL HIGH (ref 0.61–1.24)
GLUCOSE: 102 mg/dL — AB (ref 65–99)
HCT: 46 % (ref 39.0–52.0)
Hemoglobin: 15.6 g/dL (ref 13.0–17.0)
POTASSIUM: 2.5 mmol/L — AB (ref 3.5–5.1)
Sodium: 132 mmol/L — ABNORMAL LOW (ref 135–145)
TCO2: 34 mmol/L — ABNORMAL HIGH (ref 22–32)

## 2017-04-17 LAB — PROTIME-INR
INR: 0.94
Prothrombin Time: 12.5 seconds (ref 11.4–15.2)

## 2017-04-17 LAB — LACTIC ACID, PLASMA
LACTIC ACID, VENOUS: 1.8 mmol/L (ref 0.5–1.9)
Lactic Acid, Venous: 1.6 mmol/L (ref 0.5–1.9)

## 2017-04-17 LAB — COMPLETE METABOLIC PANEL WITH GFR
AG Ratio: 1.6 (calc) (ref 1.0–2.5)
ALKALINE PHOSPHATASE (APISO): 91 U/L (ref 40–115)
ALT: 36 U/L (ref 9–46)
AST: 58 U/L — AB (ref 10–35)
Albumin: 4.1 g/dL (ref 3.6–5.1)
BILIRUBIN TOTAL: 1 mg/dL (ref 0.2–1.2)
BUN / CREAT RATIO: 8 (calc) (ref 6–22)
BUN: 20 mg/dL (ref 7–25)
CHLORIDE: 86 mmol/L — AB (ref 98–110)
CO2: 35 mmol/L — AB (ref 20–32)
CREATININE: 2.41 mg/dL — AB (ref 0.70–1.33)
Calcium: 11.4 mg/dL — ABNORMAL HIGH (ref 8.6–10.3)
GFR, Est African American: 33 mL/min/{1.73_m2} — ABNORMAL LOW (ref >=60)
GFR, Est Non African American: 29 mL/min/{1.73_m2} — ABNORMAL LOW (ref >=60)
GLUCOSE: 117 mg/dL (ref 65–139)
Globulin: 2.6 g/dL (calc) (ref 1.9–3.7)
Potassium: 3.5 mmol/L (ref 3.5–5.3)
SODIUM: 135 mmol/L (ref 135–146)
Total Protein: 6.7 g/dL (ref 6.1–8.1)

## 2017-04-17 LAB — PROCALCITONIN: PROCALCITONIN: 0.27 ng/mL

## 2017-04-17 LAB — TROPONIN I
Troponin I: <0.03 ng/mL
Troponin I: <0.03 ng/mL (ref <0.03)

## 2017-04-17 LAB — I-STAT CG4 LACTIC ACID, ED: LACTIC ACID, VENOUS: 3.87 mmol/L — AB (ref 0.5–1.9)

## 2017-04-17 LAB — APTT: aPTT: 28 seconds (ref 24–36)

## 2017-04-17 LAB — ETHANOL: Alcohol, Ethyl (B): 23 mg/dL — ABNORMAL HIGH (ref <10)

## 2017-04-17 LAB — BRAIN NATRIURETIC PEPTIDE: Brain Natriuretic Peptide: 18 pg/mL (ref <100)

## 2017-04-17 MED ORDER — FOLIC ACID 1 MG PO TABS
1.0000 mg | ORAL_TABLET | Freq: Every day | ORAL | Status: DC
  Administered 2017-04-17 – 2017-04-19 (×3): 1 mg via ORAL
  Filled 2017-04-17 (×3): qty 1

## 2017-04-17 MED ORDER — HYDROCODONE-ACETAMINOPHEN 10-325 MG PO TABS
1.0000 | ORAL_TABLET | Freq: Three times a day (TID) | ORAL | Status: DC
  Administered 2017-04-17 – 2017-04-19 (×7): 1 via ORAL
  Filled 2017-04-17 (×7): qty 1

## 2017-04-17 MED ORDER — ACETAMINOPHEN 650 MG RE SUPP: 650.0000 mg | Freq: Four times a day (QID) | RECTAL | Status: DC | PRN

## 2017-04-17 MED ORDER — LORAZEPAM 1 MG PO TABS: 1.0000 mg | ORAL_TABLET | Freq: Four times a day (QID) | ORAL | Status: DC | PRN

## 2017-04-17 MED ORDER — POTASSIUM CHLORIDE IN NACL 20-0.9 MEQ/L-% IV SOLN
INTRAVENOUS | Status: DC
  Administered 2017-04-17: 18:00:00 via INTRAVENOUS

## 2017-04-17 MED ORDER — PIPERACILLIN-TAZOBACTAM 3.375 G IVPB 30 MIN
3.3750 g | Freq: Once | INTRAVENOUS | Status: AC
  Administered 2017-04-17: 3.375 g via INTRAVENOUS
  Filled 2017-04-17 (×2): qty 50

## 2017-04-17 MED ORDER — VANCOMYCIN HCL IN DEXTROSE 1-5 GM/200ML-% IV SOLN
1000.0000 mg | Freq: Once | INTRAVENOUS | Status: AC
  Administered 2017-04-17: 1000 mg via INTRAVENOUS
  Filled 2017-04-17: qty 200

## 2017-04-17 MED ORDER — POTASSIUM CHLORIDE 10 MEQ/100ML IV SOLN
10.0000 meq | Freq: Once | INTRAVENOUS | Status: AC
  Administered 2017-04-17: 10 meq via INTRAVENOUS
  Filled 2017-04-17: qty 100

## 2017-04-17 MED ORDER — LORAZEPAM 2 MG/ML IJ SOLN
0.0000 mg | Freq: Four times a day (QID) | INTRAMUSCULAR | Status: DC
  Administered 2017-04-18: 0.5 mg via INTRAVENOUS
  Administered 2017-04-18 (×2): 2 mg via INTRAVENOUS
  Administered 2017-04-18: 1 mg via INTRAVENOUS
  Administered 2017-04-19: 2 mg via INTRAVENOUS
  Filled 2017-04-17 (×4): qty 1

## 2017-04-17 MED ORDER — SODIUM CHLORIDE 0.9 % IV BOLUS (SEPSIS)
1000.0000 mL | Freq: Once | INTRAVENOUS | Status: AC
  Administered 2017-04-17: 1000 mL via INTRAVENOUS

## 2017-04-17 MED ORDER — VENLAFAXINE HCL ER 75 MG PO CP24
150.0000 mg | ORAL_CAPSULE | Freq: Every day | ORAL | Status: DC
  Administered 2017-04-18 – 2017-04-19 (×2): 150 mg via ORAL
  Filled 2017-04-17 (×2): qty 2

## 2017-04-17 MED ORDER — HEPARIN SODIUM (PORCINE) 5000 UNIT/ML IJ SOLN
5000.0000 [IU] | Freq: Three times a day (TID) | INTRAMUSCULAR | Status: DC
  Administered 2017-04-17 – 2017-04-19 (×6): 5000 [IU] via SUBCUTANEOUS
  Filled 2017-04-17 (×6): qty 1

## 2017-04-17 MED ORDER — POLYETHYLENE GLYCOL 3350 17 G PO PACK: 17.0000 g | PACK | Freq: Every day | ORAL | Status: DC | PRN

## 2017-04-17 MED ORDER — POTASSIUM CHLORIDE CRYS ER 20 MEQ PO TBCR
40.0000 meq | EXTENDED_RELEASE_TABLET | Freq: Once | ORAL | Status: AC
  Administered 2017-04-17: 40 meq via ORAL
  Filled 2017-04-17: qty 2

## 2017-04-17 MED ORDER — VANCOMYCIN HCL 10 G IV SOLR
1250.0000 mg | INTRAVENOUS | Status: DC
  Administered 2017-04-18: 1250 mg via INTRAVENOUS
  Filled 2017-04-17 (×2): qty 1250

## 2017-04-17 MED ORDER — ONDANSETRON HCL 4 MG PO TABS: 4.0000 mg | ORAL_TABLET | Freq: Four times a day (QID) | ORAL | Status: DC | PRN

## 2017-04-17 MED ORDER — ONDANSETRON HCL 4 MG/2ML IJ SOLN: 4.0000 mg | Freq: Four times a day (QID) | INTRAMUSCULAR | Status: DC | PRN

## 2017-04-17 MED ORDER — VANCOMYCIN HCL IN DEXTROSE 1-5 GM/200ML-% IV SOLN: 1000.0000 mg | Freq: Once | INTRAVENOUS | Status: DC

## 2017-04-17 MED ORDER — ADULT MULTIVITAMIN W/MINERALS CH
1.0000 | ORAL_TABLET | Freq: Every day | ORAL | Status: DC
  Administered 2017-04-17 – 2017-04-19 (×3): 1 via ORAL
  Filled 2017-04-17 (×3): qty 1

## 2017-04-17 MED ORDER — LORAZEPAM 2 MG/ML IJ SOLN: 0.0000 mg | Freq: Two times a day (BID) | INTRAMUSCULAR | Status: DC

## 2017-04-17 MED ORDER — PIPERACILLIN-TAZOBACTAM 3.375 G IVPB
3.3750 g | Freq: Three times a day (TID) | INTRAVENOUS | Status: DC
  Administered 2017-04-17 – 2017-04-19 (×6): 3.375 g via INTRAVENOUS
  Filled 2017-04-17 (×6): qty 50

## 2017-04-17 MED ORDER — SODIUM CHLORIDE 0.9 % IV BOLUS (SEPSIS)
500.0000 mL | Freq: Once | INTRAVENOUS | Status: AC
  Administered 2017-04-17: 500 mL via INTRAVENOUS

## 2017-04-17 MED ORDER — TAMSULOSIN HCL 0.4 MG PO CAPS
0.8000 mg | ORAL_CAPSULE | Freq: Every day | ORAL | Status: DC
  Administered 2017-04-17 – 2017-04-19 (×3): 0.8 mg via ORAL
  Filled 2017-04-17 (×3): qty 2

## 2017-04-17 MED ORDER — ACETAMINOPHEN 325 MG PO TABS: 650.0000 mg | ORAL_TABLET | Freq: Four times a day (QID) | ORAL | Status: DC | PRN

## 2017-04-17 MED ORDER — LORAZEPAM 2 MG/ML IJ SOLN: 1.0000 mg | Freq: Four times a day (QID) | INTRAMUSCULAR | Status: DC | PRN

## 2017-04-17 MED ORDER — IPRATROPIUM-ALBUTEROL 0.5-2.5 (3) MG/3ML IN SOLN: 3.0000 mL | RESPIRATORY_TRACT | Status: DC | PRN

## 2017-04-17 MED ORDER — ALBUTEROL SULFATE (2.5 MG/3ML) 0.083% IN NEBU: 3.0000 mL | INHALATION_SOLUTION | Freq: Four times a day (QID) | RESPIRATORY_TRACT | Status: DC | PRN

## 2017-04-17 NOTE — Telephone Encounter (Signed)
Adrian LundJanet (wife) is requesting a returned call about the further testing that he is suppose to do today. She wants information about the orders and where to go Cb#: [phone]

## 2017-04-17 NOTE — ED Notes (Signed)
CRITICAL VALUE ALERT  Critical Value:  Potassium 2.5  Date & Time Notied:  04/17/2017  Provider Notified: Dr. Estell HarpinZammit  Orders Received/Actions taken: See chart

## 2017-04-17 NOTE — Telephone Encounter (Signed)
He is in the ER.

## 2017-04-17 NOTE — Progress Notes (Signed)
Pharmacy Antibiotic Note  Adrian Rivas is a 56 y.o. male admitted on 04/17/2017 with sepsis.  Pharmacy has been consulted for Vancomycin and zosyn dosing.  Plan: Vancomycin 2000mg  loading dose, then 1250mg   IV every 24 hours.  Goal trough 15-20 mcg/mL. Zosyn 3.375g IV q8h (4 hour infusion).  F/U cxs and clinical progress Monitor V/S, labs, and levels as indicated  Height: 5\' 6"  (167.6 cm) Weight: 252 lb 3.2 oz (114.4 kg) IBW/kg (Calculated) : 63.8  Temp (24hrs), Avg:98.3 F (36.8 C), Min:97.9 F (36.6 C), Max:98.6 F (37 C)  Recent Labs  Lab 04/16/17 1701 04/17/17 1228 04/17/17 1252 04/17/17 1733  WBC 11.8* 9.1  --   --   CREATININE 2.41* 2.47* 2.50*  --   LATICACIDVEN  --   --  3.87* 1.6    Normalized CrCl is 6433mls/min Estimated Creatinine Clearance: 38.3 mL/min (A) (by C-G formula based on SCr of 2.5 mg/dL (H)).    Allergies  Allergen Reactions  . No Known Allergies     Antimicrobials this admission: Vancomycin 12/14 >>  Zosyn 12/14 >>   Dose adjustments this admission: N/A  Microbiology results: 12/14 BCx: pending 12/14 UCx: pending   Thank you for allowing pharmacy to be a part of this patient's care.  Elder CyphersLorie Devyon Keator, BS Loura Backharm D, [place] Clinical Pharmacist Pager [phone] 04/17/2017 6:48 PM

## 2017-04-17 NOTE — ED Notes (Signed)
Pt is awake and alert.  Reports weakness.  Denies any pain.

## 2017-04-17 NOTE — H&P (Signed)
History and Physical    BURDETT PINZON ZOX:[id number] DOB: 09/05/1960 DOA: 04/17/2017  PCP: Eustace Moore, MD  Patient coming from: Home  Chief Complaint: Weakness, fatigue  HPI: NORMAL RECINOS is a 56 y.o. male with medical history significant of COPD, dyslipidemia, depression, hypertension, chronic pain presents the emergency department after being told to come in from labs that were drawn that were abnormal.  Patient saw his primary care physician Dr. Delton See yesterday and complained of fatigue as well as weakness.  His blood pressure at that time was 70 systolic and he was told to stop 1 of his blood pressure medications.  He was also sent for labs, chest x-ray, and a head CT per his wife for concerns of recent confusion as well as word finding difficulties.  Upon reviewing the record patient was in the emergency department on 03-20-2017 with a similar complaint of fatigue for which he signed himself out AMA.  At that time his lactic acid was also elevated.  Patient denies fevers or chills.  He denies chest pain, chest pressure, shortness of breath, nausea or vomiting.  He later admits that he has been drinking alcohol but would not quantify an amount.  He states his last drink was yesterday in which he consumed 1 ounce of vodka.  On previous emergency room visit on 03/20/2017 patient's alcohol level was found to be greater than 150.  Patient denies any sick contacts.  His wife reports that for the past 3-4 months he has lost a significant amount of weight and he has had issues where he takes longer than normal to respond to questioning.  She reports that he is also been falling more frequently at home.  She does state that she has not been able to take his blood pressure as she is supposed to daily at home since Thanksgiving.  Upon reviewing patient's note from his primary care physician yesterday Dr. Delton See expressed concern over patient's declining health.  She also order an  echocardiogram.  ED Course: Patient was seen and evaluated.  At time of presentation blood pressure was 70/46 with a pulse of 71 and respiratory rate of 16.  His lactic acid was elevated at 3.87, a troponin was less than 0.03 and he was found to have a macrocytic anemia with an MCV of 109.5.  Electrolytes patient had hyponatremia at 132, hypokalemia at 2.5, and a creatinine of 2.5(With a previous baseline that was normal a few months ago).  Patient was given 3 L of IV fluids and given vancomycin for possible sepsis.  [facility] was asked to admit for hypotension as well as acute renal failure.  Review of Systems: As per HPI otherwise 10 point review of systems negative.    Past Medical History:  Diagnosis Date  . Acute renal failure (ARF) (HCC) 04/17/2017  . Anxiety   . Arthritis    spine  . COPD (chronic obstructive pulmonary disease) (HCC)    TOLD HE HAS COPD - QUIT SMOKING 15 YRS AGO-- NO C/O OF SOB  . Depression   . Dyslipidemia   . HTN (hypertension)   . Hyperlipidemia   . Pain    CHRONIC NECK PAIN- HX OF ANTERIOR FUSION  . Pain    LOWER BACK WITH PAIN DOWN BOTH LEGS - BUT WORSE LEFT LEG WITH NUMBNESS OUTER SIDE OF LEFT LEG - HAS SPINAL STENOSIS  . Substance abuse (HCC)    quit drinking 15 + years ago    Past Surgical History:  Procedure Laterality Date  . "SMALL BODIES REMOVED" FROM BOTH SHOULDERS    . BACK SURGERY     LUMBAR LAMINECTOMY  . BILATERAL CARPAL TUNNEL RELEASE    . CERVICAL FUSION    . FASCIOTOMY Left 02/19/2016   Procedure: ANTERIOR COMPARTMENT FASCIOTOMY;  Surgeon: Myrene Galas, MD;  Location: [facility] OR;  Service: Orthopedics;  Laterality: Left;  . FRACTURE SURGERY    . LUMBAR LAMINECTOMY/DECOMPRESSION MICRODISCECTOMY N/A 10/20/2012   Procedure: DECOMPRESSION L2-L3, L1-L2;  Surgeon: Javier Docker, MD;  Location: [facility] ORS;  Service: Orthopedics;  Laterality: N/A;  . ORIF TIBIA PLATEAU Left 02/19/2016  . ORIF TIBIA PLATEAU Left 02/19/2016   Procedure: OPEN REDUCTION  INTERNAL FIXATION (ORIF) TIBIAL PLATEAU;  Surgeon: Myrene Galas, MD;  Location: [facility];  Service: Orthopedics;  Laterality: Left;  . RIGHT ACL RECONSTRUCTION    . SPINE SURGERY       reports that he quit smoking about 20 years ago. His smoking use included cigarettes. He has a 20.00 pack-year smoking history. he has never used smokeless tobacco. He reports that he does not drink alcohol or use drugs.  Of note patient did admit to me that he does drink alcohol on a regular basis but would not quantify an amount  Allergies  Allergen Reactions  . No Known Allergies     Family History  Problem Relation Age of Onset  . Clotting disorder Mother   . COPD Mother   . Hypertension Mother   . Early death Mother 21       fracture - blood clot  . Kidney disease Father   . COPD Father   . Stroke Father      Prior to Admission medications   Medication Sig Start Date End Date Taking? Authorizing Provider  albuterol (PROVENTIL HFA;VENTOLIN HFA) 108 (90 Base) MCG/ACT inhaler Inhale 2 puffs into the lungs every 6 (six) hours as needed for wheezing or shortness of breath. 01/15/17  Yes Eustace Moore, MD  aspirin EC 81 MG tablet Take 81 mg every evening by mouth.   Yes [provider]  cholecalciferol (VITAMIN D) 400 UNITS TABS Take 400 Units by mouth daily.   Yes [provider]  desvenlafaxine (PRISTIQ) 100 MG 24 hr tablet Take 100 mg by mouth every morning.   Yes [provider]  hydrochlorothiazide (HYDRODIURIL) 25 MG tablet Take 1 tablet (25 mg total) by mouth daily. 08/28/16  Yes Eustace Moore, MD  HYDROcodone-acetaminophen [facility]) 10-325 MG tablet Take 1 tablet by mouth every 8 (eight) hours.  08/08/16  Yes [provider]  ipratropium-albuterol (DUONEB) 0.5-2.5 (3) MG/3ML SOLN Take 3 mLs by nebulization every 4 (four) hours as needed. 04/01/17  Yes Eustace Moore, MD  lamoTRIgine (LAMICTAL) 100 MG tablet Take 100-200 mg 2 (two) times daily by mouth.  TAKES 200 mg in the morning and 100mg  in the afternoon   Yes [provider]  methocarbamol (ROBAXIN) 500 MG tablet Take 1 tablet (500 mg total) by mouth every 6 (six) hours as needed for muscle spasms. Patient taking differently: Take 500 mg 3 (three) times daily by mouth.  02/29/16  Yes Angiulli, Mcarthur Rossetti, PA-C  metoprolol succinate (TOPROL-XL) 50 MG 24 hr tablet Take 1 tablet (50 mg total) by mouth daily. Take with or immediately following a meal. 09/23/16  Yes Eustace Moore, MD  mometasone (NASONEX) 50 MCG/ACT nasal spray Place 2 sprays into the nose daily as needed. 08/28/16  Yes Eustace Moore, MD  Multiple Vitamin (MULTIVITAMIN WITH MINERALS) TABS Take 1 tablet by mouth daily.   Yes [provider]  potassium chloride (K-DUR) 10 MEQ tablet Take 1 tablet (10 mEq total) by mouth daily. 04/02/17  Yes Eustace Moore, MD  REXULTI 1 MG TABS Take 1 tablet daily by mouth.  01/16/16  Yes [provider]  rosuvastatin (CRESTOR) 40 MG tablet Take 40 mg every evening by mouth.   Yes [provider]  tamsulosin (FLOMAX) 0.4 MG CAPS capsule Take 2 capsules (0.8 mg total) by mouth daily. 04/16/17  Yes Eustace Moore, MD  tiotropium (SPIRIVA) 18 MCG inhalation capsule Place 1 capsule (18 mcg total) into inhaler and inhale daily. 01/15/17 04/17/17 Yes Eustace Moore, MD  UNABLE TO FIND Nebulizer machine and supplies  Dx dyspnea 04/01/17  Yes Eustace Moore, MD  UNABLE TO FIND Home 02, concentrator - 2-4 lpm via n/c to maintain o2 sat 90% or above.  Dx : COPD,HYPOXIA 04/01/17  Yes Eustace Moore, MD  zolpidem (AMBIEN) 10 MG tablet Take 10 mg at bedtime by mouth.    Yes [provider]    Physical Exam: Vitals:   04/17/17 1214 04/17/17 1257 04/17/17 1300 04/17/17 1315  BP: (!) 73/45 (!) 69/46 (!) 70/46 (!) 86/48  Pulse: 83 72 71 72  Resp: (!) 22 14 16 10   Temp: 97.9 F (36.6 C)     TempSrc: Oral     SpO2: (!) 88% 92% 93% 94%   Weight: 102.1 kg (225 lb)     Height: 5' 6.5" (1.689 m)         Constitutional: NAD, calm, comfortable Vitals:   04/17/17 1214 04/17/17 1257 04/17/17 1300 04/17/17 1315  BP: (!) 73/45 (!) 69/46 (!) 70/46 (!) 86/48  Pulse: 83 72 71 72  Resp: (!) 22 14 16 10   Temp: 97.9 F (36.6 C)     TempSrc: Oral     SpO2: (!) 88% 92% 93% 94%  Weight: 102.1 kg (225 lb)     Height: 5' 6.5" (1.689 m)      Eyes: PERRL, lids and conjunctivae normal ENMT: Mucous membranes are moist. Posterior pharynx clear of any exudate or lesions.Normal dentition.  Neck: normal, supple, no masses, no thyromegaly Respiratory: clear to auscultation bilaterally, no wheezing, no crackles. Normal respiratory effort. No accessory muscle use.  Cardiovascular: Regular rate and rhythm, no murmurs / rubs / gallops. No extremity edema. 2+ pedal pulses. No carotid bruits.  Abdomen: no tenderness, no masses palpated. No hepatosplenomegaly. Bowel sounds positive.  Musculoskeletal: no clubbing / cyanosis. No joint deformity upper and lower extremities. Good ROM, no contractures. Normal muscle tone.  Skin: no rashes, lesions, ulcers. No induration Neurologic: CN 2-12 grossly intact. Sensation intact, DTR normal. Strength 5/5 in all 4.  Psychiatric: Normal judgment and insight. Alert and oriented x 3. Normal mood.     Labs on Admission: I have personally reviewed following labs and imaging studies  CBC: Recent Labs  Lab 04/16/17 1701 04/17/17 1228 04/17/17 1252  WBC 11.8* 9.1  --   NEUTROABS 8,142* 5.5  --   HGB 15.2 14.5 15.6  HCT 43.1 44.9 46.0  MCV 102.4* 109.5*  --   PLT 264 224  --    Basic Metabolic Panel: Recent Labs  Lab 04/16/17 1701 04/17/17 1228 04/17/17 1252  NA 135 135 132*  K 3.5 2.5* 2.5*  CL 86* 85* 86*  CO2 35* 34*  --   GLUCOSE 117 100* 102*  BUN 20 21* 19  CREATININE 2.41* 2.47* 2.50*  CALCIUM 11.4* 10.9*  --    GFR: Estimated Creatinine Clearance: 36.4 mL/min (A) (by C-G formula based  on SCr of 2.5 mg/dL (H)). Liver Function Tests: Recent Labs  Lab 04/16/17 1701 04/17/17 1228  AST 58* 73*  ALT 36 39  ALKPHOS  --  95  BILITOT 1.0 1.0  PROT 6.7 7.0  ALBUMIN  --  4.0   No results for input(s): LIPASE, AMYLASE in the last 168 hours. No results for input(s): AMMONIA in the last 168 hours. Coagulation Profile: No results for input(s): INR, PROTIME in the last 168 hours. Cardiac Enzymes: Recent Labs  Lab 04/17/17 1228  TROPONINI <0.03   BNP (last 3 results) No results for input(s): PROBNP in the last 8760 hours. HbA1C: No results for input(s): HGBA1C in the last 72 hours. CBG: No results for input(s): GLUCAP in the last 168 hours. Lipid Profile: No results for input(s): CHOL, HDL, LDLCALC, TRIG, CHOLHDL, LDLDIRECT in the last 72 hours. Thyroid Function Tests: No results for input(s): TSH, T4TOTAL, FREET4, T3FREE, THYROIDAB in the last 72 hours. Anemia Panel: No results for input(s): VITAMINB12, FOLATE, FERRITIN, TIBC, IRON, RETICCTPCT in the last 72 hours. Urine analysis:    Component Value Date/Time   COLORURINE YELLOW 03/20/2017 1730   APPEARANCEUR HAZY (A) 03/20/2017 1730   LABSPEC 1.020 03/20/2017 1730   PHURINE 5.0 03/20/2017 1730   GLUCOSEU NEGATIVE 03/20/2017 1730   HGBUR LARGE (A) 03/20/2017 1730   BILIRUBINUR NEGATIVE 03/20/2017 1730   KETONESUR 20 (A) 03/20/2017 1730   PROTEINUR 100 (A) 03/20/2017 1730   UROBILINOGEN 0.2 03/17/2008 2144   NITRITE NEGATIVE 03/20/2017 1730   LEUKOCYTESUR NEGATIVE 03/20/2017 1730   Sepsis Labs: !!!!!!!!!!!!!!!!!!!!!!!!!!!!!!!!!!!!!!!!!!!! @LABRCNTIP (procalcitonin:4,lacticidven:4) )No results found for this or any previous visit (from the past 240 hour(s)).   Radiological Exams on Admission: Dg Chest Portable 1 View  Result Date: 04/17/2017 CLINICAL DATA:  Weakness EXAM: PORTABLE CHEST 1 VIEW COMPARISON:  03/20/2017 FINDINGS: Emphysema by CT. Haziness of the chest primarily from soft tissue attenuation.  There is no edema, consolidation, effusion, or pneumothorax. Borderline heart size accentuated by mediastinal fat pad. Negative mediastinal contours. IMPRESSION: COPD without acute superimposed finding. Electronically Signed   By: Marnee SpringJonathon  Watts M.D.   On: 04/17/2017 13:08    EKG: Independently reviewed. Sinus rhythm  Assessment/Plan Principal Problem:   Hypotension Active Problems:   Lactic acidosis   Acute renal failure (ARF) (HCC)     Hypotension -Holding all antihypertensives -Patient received 3 L of IV fluids in the emergency department with an improvement of systolic blood pressures into the 90s - Give an additional 500 mL bolus and then follow-up with 125 mL's per hour of normal saline with 20 mEq of potassium -Concern for sepsis, blood and urine cultures drawn and pending -Continue to monitor on telemetry -Ambulate with assistance only -Trend troponins although ACS is unlikely  Acute renal failure likely due to prerenal azotemia ATN -Likely due to hypotension as well as overmedication with antihypertensives -Repeat BMP after IV fluid resuscitation in a.m.  Lactic acidosis -Patient previously had an elevated lactic acid 3 weeks ago and signed himself out AMA from the emergency department -Serial lactic acids  Chronic pain -Continue home narcotics  Urinary retention -Continue Flomax    DVT prophylaxis: Lovenox   Code Status:  Full Code  Family Communication:  Wife bedside Disposition Plan: Pending improvement, will likely discharge back to previous home environment  Consults called: None Admission  status:  Inpt, telemetry   Katrinka BlazingAlex U Kadolph MD [facility] Pager [phone]  If 7PM-7AM, please contact night-coverage [email or web address] Password [email or web address]  04/17/2017, 2:05 PM

## 2017-04-17 NOTE — Telephone Encounter (Signed)
Called wife Antonieta IbaJanet Tim needs to go tot he hospital today He is in renal failure based on the labs done yesterday afternoon Needs further eval and IVF YSN

## 2017-04-17 NOTE — ED Triage Notes (Addendum)
PT sent to ED for further eval and possible IV fluids bc abnormal labs and acute kidney failure by Dr. Delton SeeNelson. One b/p medication was d/c yesterday as well d/t hypotension. PT states generalized weakness and malaise for the past few weeks.

## 2017-04-17 NOTE — ED Provider Notes (Signed)
[facility] EMERGENCY DEPARTMENT Provider Note   CSN: [id number] Arrival date & time: 04/17/17  1210     History   Chief Complaint Chief Complaint  Patient presents with  . Abnormal Lab  . Weakness    HPI Adrian Rivas is a 56 y.o. male.  Patient complains of some dizziness and weakness for number days.  He was seen by his family doctor and his blood pressure was 70 yesterday.  Patient was told to stop 1 of his 3 blood pressure medicines.  He was also called today because his lab test shows that he is an AK I   The history is provided by the patient.  Weakness  Primary symptoms include no focal weakness. This is a recurrent problem. The current episode started more than 2 days ago. The problem has not changed since onset.There was no focality noted. There has been no fever. Pertinent negatives include no shortness of breath, no chest pain and no headaches. There were no medications administered prior to arrival. Associated medical issues do not include trauma.    Past Medical History:  Diagnosis Date  . Acute renal failure (ARF) (HCC) 04/17/2017  . Anxiety   . Arthritis    spine  . COPD (chronic obstructive pulmonary disease) (HCC)    TOLD HE HAS COPD - QUIT SMOKING 15 YRS AGO-- NO C/O OF SOB  . Depression   . Dyslipidemia   . HTN (hypertension)   . Hyperlipidemia   . Pain    CHRONIC NECK PAIN- HX OF ANTERIOR FUSION  . Pain    LOWER BACK WITH PAIN DOWN BOTH LEGS - BUT WORSE LEFT LEG WITH NUMBNESS OUTER SIDE OF LEFT LEG - HAS SPINAL STENOSIS  . Substance abuse (HCC)    quit drinking 15 + years ago    Patient Active Problem List   Diagnosis Date Noted  . Hypotension 04/17/2017  . Lactic acidosis 04/17/2017  . Acute renal failure (ARF) (HCC) 04/17/2017  . Substance abuse in remission (HCC) 09/12/2016  . Morbid obesity (HCC)   . Primary insomnia   . Benign prostatic hyperplasia without lower urinary tract symptoms   . Hyperlipidemia   . Abnormality of gait   .  COPD (chronic obstructive pulmonary disease) (HCC)   . HTN (hypertension)   . Spinal stenosis of lumbar region 10/20/2012    Past Surgical History:  Procedure Laterality Date  . "SMALL BODIES REMOVED" FROM BOTH SHOULDERS    . BACK SURGERY     LUMBAR LAMINECTOMY  . BILATERAL CARPAL TUNNEL RELEASE    . CERVICAL FUSION    . FASCIOTOMY Left 02/19/2016   Procedure: ANTERIOR COMPARTMENT FASCIOTOMY;  Surgeon: Myrene Galas, MD;  Location: [facility] OR;  Service: Orthopedics;  Laterality: Left;  . FRACTURE SURGERY    . LUMBAR LAMINECTOMY/DECOMPRESSION MICRODISCECTOMY N/A 10/20/2012   Procedure: DECOMPRESSION L2-L3, L1-L2;  Surgeon: Javier Docker, MD;  Location: [facility] ORS;  Service: Orthopedics;  Laterality: N/A;  . ORIF TIBIA PLATEAU Left 02/19/2016  . ORIF TIBIA PLATEAU Left 02/19/2016   Procedure: OPEN REDUCTION INTERNAL FIXATION (ORIF) TIBIAL PLATEAU;  Surgeon: Myrene Galas, MD;  Location: [facility] OR;  Service: Orthopedics;  Laterality: Left;  . RIGHT ACL RECONSTRUCTION    . SPINE SURGERY         Home Medications    Prior to Admission medications   Medication Sig Start Date End Date Taking? Authorizing Provider  albuterol (PROVENTIL HFA;VENTOLIN HFA) 108 (90 Base) MCG/ACT inhaler Inhale 2 puffs into the lungs  every 6 (six) hours as needed for wheezing or shortness of breath. 01/15/17  Yes Eustace MooreNelson, Yvonne Sue, MD  aspirin EC 81 MG tablet Take 81 mg every evening by mouth.   Yes [provider]  cholecalciferol (VITAMIN D) 400 UNITS TABS Take 400 Units by mouth daily.   Yes [provider]  desvenlafaxine (PRISTIQ) 100 MG 24 hr tablet Take 100 mg by mouth every morning.   Yes [provider]  hydrochlorothiazide (HYDRODIURIL) 25 MG tablet Take 1 tablet (25 mg total) by mouth daily. 08/28/16  Yes Eustace MooreNelson, Yvonne Sue, MD  HYDROcodone-acetaminophen [facility](NORCO) 10-325 MG tablet Take 1 tablet by mouth every 8 (eight) hours.  08/08/16  Yes [provider]  ipratropium-albuterol  (DUONEB) 0.5-2.5 (3) MG/3ML SOLN Take 3 mLs by nebulization every 4 (four) hours as needed. 04/01/17  Yes Eustace MooreNelson, Yvonne Sue, MD  lamoTRIgine (LAMICTAL) 100 MG tablet Take 100-200 mg 2 (two) times daily by mouth. TAKES 200 mg in the morning and 100mg  in the afternoon   Yes [provider]  methocarbamol (ROBAXIN) 500 MG tablet Take 1 tablet (500 mg total) by mouth every 6 (six) hours as needed for muscle spasms. Patient taking differently: Take 500 mg 3 (three) times daily by mouth.  02/29/16  Yes Angiulli, Mcarthur Rossettianiel J, PA-C  metoprolol succinate (TOPROL-XL) 50 MG 24 hr tablet Take 1 tablet (50 mg total) by mouth daily. Take with or immediately following a meal. 09/23/16  Yes Eustace MooreNelson, Yvonne Sue, MD  mometasone (NASONEX) 50 MCG/ACT nasal spray Place 2 sprays into the nose daily as needed. 08/28/16  Yes Eustace MooreNelson, Yvonne Sue, MD  Multiple Vitamin (MULTIVITAMIN WITH MINERALS) TABS Take 1 tablet by mouth daily.   Yes [provider]  potassium chloride (K-DUR) 10 MEQ tablet Take 1 tablet (10 mEq total) by mouth daily. 04/02/17  Yes Eustace MooreNelson, Yvonne Sue, MD  REXULTI 1 MG TABS Take 1 tablet daily by mouth.  01/16/16  Yes [provider]  rosuvastatin (CRESTOR) 40 MG tablet Take 40 mg every evening by mouth.   Yes [provider]  tamsulosin (FLOMAX) 0.4 MG CAPS capsule Take 2 capsules (0.8 mg total) by mouth daily. 04/16/17  Yes Eustace MooreNelson, Yvonne Sue, MD  tiotropium (SPIRIVA) 18 MCG inhalation capsule Place 1 capsule (18 mcg total) into inhaler and inhale daily. 01/15/17 04/17/17 Yes Eustace MooreNelson, Yvonne Sue, MD  UNABLE TO FIND Nebulizer machine and supplies  Dx dyspnea 04/01/17  Yes Eustace MooreNelson, Yvonne Sue, MD  UNABLE TO FIND Home 02, concentrator - 2-4 lpm via n/c to maintain o2 sat 90% or above.  Dx : COPD,HYPOXIA 04/01/17  Yes Eustace MooreNelson, Yvonne Sue, MD  zolpidem (AMBIEN) 10 MG tablet Take 10 mg at bedtime by mouth.    Yes [provider]    Family History Family History  Problem  Relation Age of Onset  . Clotting disorder Mother   . COPD Mother   . Hypertension Mother   . Early death Mother [id number]       fracture - blood clot  . Kidney disease Father   . COPD Father   . Stroke Father     Social History Social History   Tobacco Use  . Smoking status: Former Smoker    Packs/day: 1.00    Years: 20.00    Pack years: 20.00    Types: Cigarettes    Last attempt to quit: 05/05/1996    Years since quitting: 20.9  . Smokeless tobacco: Never Used  . Tobacco comment: QUIT SMOKING  ABOUT 15 YRS AGO  Substance Use Topics  . Alcohol use: No  . Drug use: No     Allergies   No known allergies   Review of Systems Review of Systems  Constitutional: Negative for appetite change and fatigue.  HENT: Negative for congestion, ear discharge and sinus pressure.   Eyes: Negative for discharge.  Respiratory: Negative for cough and shortness of breath.   Cardiovascular: Negative for chest pain.  Gastrointestinal: Negative for abdominal pain and diarrhea.  Genitourinary: Negative for frequency and hematuria.  Musculoskeletal: Negative for back pain.  Skin: Negative for rash.  Neurological: Positive for weakness. Negative for focal weakness, seizures and headaches.  Psychiatric/Behavioral: Negative for hallucinations.     Physical Exam Updated Vital Signs BP 97/66   Pulse 80   Temp 97.9 F (36.6 C) (Oral)   Resp 16   Ht 5' 6.5" (1.689 m)   Wt 102.1 kg (225 lb)   SpO2 95%   BMI 35.77 kg/m   Physical Exam  Constitutional: He is oriented to person, place, and time. He appears well-developed.  HENT:  Head: Normocephalic.  Eyes: Conjunctivae and EOM are normal. No scleral icterus.  Neck: Neck supple. No thyromegaly present.  Cardiovascular: Normal rate and regular rhythm. Exam reveals no gallop and no friction rub.  No murmur heard. Pulmonary/Chest: No stridor. He has no wheezes. He has no rales. He exhibits no tenderness.  Abdominal: He exhibits no distension.  There is no tenderness. There is no rebound.  Musculoskeletal: Normal range of motion. He exhibits no edema.  Lymphadenopathy:    He has no cervical adenopathy.  Neurological: He is oriented to person, place, and time. He exhibits normal muscle tone. Coordination normal.  Skin: No rash noted. No erythema.  Psychiatric: He has a normal mood and affect. His behavior is normal.     ED Treatments / Results  Labs (all labs ordered are listed, but only abnormal results are displayed) Labs Reviewed  CBC WITH DIFFERENTIAL/PLATELET - Abnormal; Notable for the following components:      Result Value   RBC 4.10 (*)    MCV 109.5 (*)    MCH 35.4 (*)    All other components within normal limits  COMPREHENSIVE METABOLIC PANEL - Abnormal; Notable for the following components:   Potassium 2.5 (*)    Chloride 85 (*)    CO2 34 (*)    Glucose, Bld 100 (*)    BUN 21 (*)    Creatinine, Ser 2.47 (*)    Calcium 10.9 (*)    AST 73 (*)    GFR calc non Af Amer 27 (*)    GFR calc Af Amer 31 (*)    Anion gap 16 (*)    All other components within normal limits  ETHANOL - Abnormal; Notable for the following components:   Alcohol, Ethyl (B) 23 (*)    All other components within normal limits  I-STAT CHEM 8, ED - Abnormal; Notable for the following components:   Sodium 132 (*)    Potassium 2.5 (*)    Chloride 86 (*)    Creatinine, Ser 2.50 (*)    Glucose, Bld 102 (*)    TCO2 34 (*)    All other components within normal limits  I-STAT CG4 LACTIC ACID, ED - Abnormal; Notable for the following components:   Lactic Acid, Venous 3.87 (*)    All other components within normal limits  CULTURE, BLOOD (ROUTINE X 2)  CULTURE, BLOOD (ROUTINE X 2)  URINE CULTURE  TROPONIN I  URINALYSIS, ROUTINE W REFLEX MICROSCOPIC  I-STAT CG4 LACTIC ACID, ED  I-STAT CG4 LACTIC ACID, ED    EKG  EKG Interpretation None       Radiology Dg Chest Portable 1 View  Result Date: 04/17/2017 CLINICAL DATA:  Weakness  EXAM: PORTABLE CHEST 1 VIEW COMPARISON:  03/20/2017 FINDINGS: Emphysema by CT. Haziness of the chest primarily from soft tissue attenuation. There is no edema, consolidation, effusion, or pneumothorax. Borderline heart size accentuated by mediastinal fat pad. Negative mediastinal contours. IMPRESSION: COPD without acute superimposed finding. Electronically Signed   By: Marnee Spring M.D.   On: 04/17/2017 13:08    Procedures Procedures (including critical care time)  Medications Ordered in ED Medications  sodium chloride 0.9 % bolus 1,000 mL (1,000 mLs Intravenous New Bag/Given 04/17/17 1247)  sodium chloride 0.9 % bolus 1,000 mL (1,000 mLs Intravenous New Bag/Given 04/17/17 1246)  sodium chloride 0.9 % bolus 1,000 mL (1,000 mLs Intravenous New Bag/Given 04/17/17 1338)  vancomycin (VANCOCIN) IVPB 1000 mg/200 mL premix (1,000 mg Intravenous New Bag/Given 04/17/17 1336)  potassium chloride SA (K-DUR,KLOR-CON) CR tablet 40 mEq (40 mEq Oral Given 04/17/17 1337)  potassium chloride 10 mEq in 100 mL IVPB (10 mEq Intravenous New Bag/Given 04/17/17 1338)     Initial Impression / Assessment and Plan / ED Course  I have reviewed the triage vital signs and the nursing notes.  Pertinent labs & imaging results that were available during my care of the patient were reviewed by me and considered in my medical decision making (see chart for details). CRITICAL CARE Performed by: Bethann Berkshire Total critical care time:40 minutes Critical care time was exclusive of separately billable procedures and treating other patients. Critical care was necessary to treat or prevent imminent or life-threatening deterioration. Critical care was time spent personally by me on the following activities: development of treatment plan with patient and/or surrogate as well as nursing, discussions with consultants, evaluation of patient's response to treatment, examination of patient, obtaining history from patient or  surrogate, ordering and performing treatments and interventions, ordering and review of laboratory studies, ordering and review of radiographic studies, pulse oximetry and re-evaluation of patient's condition.   Patient will be admitted for AK I hypokalemia and hypotension.  Patient has been resuscitated with IV fluids and his blood pressure has improved    Final Clinical Impressions(s) / ED Diagnoses   Final diagnoses:  Hypokalemia  AKI (acute kidney injury) [facility])    ED Discharge Orders    None       Bethann Berkshire, MD 04/17/17 1447

## 2017-04-18 DIAGNOSIS — E876 Hypokalemia: Secondary | ICD-10-CM | POA: Diagnosis not present

## 2017-04-18 DIAGNOSIS — I952 Hypotension due to drugs: Secondary | ICD-10-CM | POA: Diagnosis not present

## 2017-04-18 DIAGNOSIS — E872 Acidosis: Secondary | ICD-10-CM | POA: Diagnosis not present

## 2017-04-18 DIAGNOSIS — N179 Acute kidney failure, unspecified: Secondary | ICD-10-CM | POA: Diagnosis not present

## 2017-04-18 LAB — COMPREHENSIVE METABOLIC PANEL
ALBUMIN: 3.7 g/dL (ref 3.5–5.0)
ALK PHOS: 89 U/L (ref 38–126)
ALT: 38 U/L (ref 17–63)
AST: 63 U/L — ABNORMAL HIGH (ref 15–41)
Anion gap: 9 (ref 5–15)
BUN: 18 mg/dL (ref 6–20)
CALCIUM: 9.7 mg/dL (ref 8.9–10.3)
CO2: 32 mmol/L (ref 22–32)
CREATININE: 1.7 mg/dL — AB (ref 0.61–1.24)
Chloride: 96 mmol/L — ABNORMAL LOW (ref 101–111)
GFR calc Af Amer: 49 mL/min — ABNORMAL LOW (ref >60)
GFR calc non Af Amer: 43 mL/min — ABNORMAL LOW (ref >60)
GLUCOSE: 109 mg/dL — AB (ref 65–99)
Potassium: 3.5 mmol/L (ref 3.5–5.1)
SODIUM: 137 mmol/L (ref 135–145)
Total Bilirubin: 1.1 mg/dL (ref 0.3–1.2)
Total Protein: 6.5 g/dL (ref 6.5–8.1)

## 2017-04-18 LAB — CBC
HCT: 41.4 % (ref 39.0–52.0)
Hemoglobin: 13.5 g/dL (ref 13.0–17.0)
MCH: 35.8 pg — AB (ref 26.0–34.0)
MCHC: 32.6 g/dL (ref 30.0–36.0)
MCV: 109.8 fL — ABNORMAL HIGH (ref 78.0–100.0)
Platelets: 166 10*3/uL (ref 150–400)
RBC: 3.77 MIL/uL — ABNORMAL LOW (ref 4.22–5.81)
RDW: 13.4 % (ref 11.5–15.5)
WBC: 5.9 10*3/uL (ref 4.0–10.5)

## 2017-04-18 LAB — TROPONIN I
Troponin I: <0.03 ng/mL (ref <0.03)
Troponin I: <0.03 ng/mL (ref <0.03)

## 2017-04-18 NOTE — Progress Notes (Signed)
PROGRESS NOTE    Adrian Rivas  WUJ:[id number] DOB: July 15, 1960 DOA: 04/17/2017 PCP: Eustace MooreNelson, Yvonne Sue, MD   Brief Narrative:  Adrian Rivas is a 56 y.o. male with medical history significant of COPD, dyslipidemia, depression, hypertension, chronic pain presents the emergency department after being told to come in from labs that were drawn that were abnormal.  Patient saw his primary care physician Dr. Delton SeeNelson yesterday and complained of fatigue as well as weakness.  His blood pressure at that time was 70 systolic and he was told to stop 1 of his blood pressure medications.  He was also sent for labs, chest x-ray, and a head CT per his wife for concerns of recent confusion as well as word finding difficulties.  Upon reviewing the record patient was in the emergency department on 03-20-2017 with a similar complaint of fatigue for which he signed himself out AMA.  At that time his lactic acid was also elevated.  Patient denies fevers or chills.  He denies chest pain, chest pressure, shortness of breath, nausea or vomiting.  He later admits that he has been drinking alcohol but would not quantify an amount.  He states his last drink was yesterday in which he consumed 1 ounce of vodka.  On previous emergency room visit on 03/20/2017 patient's alcohol level was found to be greater than 150.  Patient denies any sick contacts.  His wife reports that for the past 3-4 months he has lost a significant amount of weight and he has had issues where he takes longer than normal to respond to questioning.  She reports that he is also been falling more frequently at home.  She does state that she has not been able to take his blood pressure as she is supposed to daily at home since Thanksgiving.  Upon reviewing patient's note from his primary care physician yesterday Dr. Delton SeeNelson expressed concern over patient's declining health.  She also order an echocardiogram.  ED Course: Patient was seen and evaluated.  At time of  presentation blood pressure was 70/46 with a pulse of 71 and respiratory rate of 16.  His lactic acid was elevated at 3.87, a troponin was less than 0.03 and he was found to have a macrocytic anemia with an MCV of 109.5.  Electrolytes patient had hyponatremia at 132, hypokalemia at 2.5, and a creatinine of 2.5(With a previous baseline that was normal a few months ago).  Patient was given 3 L of IV fluids and given vancomycin for possible sepsis.  [facility] was asked to admit for hypotension as well as acute renal failure.   Assessment & Plan:   Principal Problem:   Hypotension Active Problems:   Lactic acidosis   Acute renal failure (ARF) (HCC)   Hypotension -Holding all antihypertensives -Patient received 3 L of IV fluids in the emergency department with an improvement of systolic blood pressures into the 90s -IV fluids for the next 5 hours -Follow-up blood and urine cultures -Echocardiogram ordered and pending  Acute renal failure likely due to prerenal azotemia ATN -Creatinine improved to 1.7 this morning -Repeat BMP in a.m. -Continue IV fluids for the next 5 hours - Renal ultrasound  Lactic acidosis -Improved resolved  Chronic pain -Continue home narcotics  Urinary retention -Continue Flomax   DVT prophylaxis: lovenox Code Status: Full code Family Communication: No family bedside Disposition Plan: Likely discharge in am after echocardiogram   Consultants:   None  Procedures:   None  Antimicrobials:   Zosyn  Vancomycin  Subjective: Patient asleep at time of exam initially.  He wakes up easily to name.  He reports he feels much better today than he did yesterday.  He did not get an echocardiogram this morning.  He states he is urinating without problem.  He denies chest pain, chest pressure, shortness of breath.  Objective: Vitals:   04/17/17 1445 04/17/17 1557 04/17/17 2049 04/18/17 0512  BP: 100/67 107/71  132/71  Pulse: 76 78  91  Resp:  20  20    Temp:  98.6 F (37 C)  (!) 97.1 F (36.2 C)  TempSrc:  Oral  Oral  SpO2: 92% 94% 91% 97%  Weight:  114.4 kg (252 lb 3.2 oz)    Height:  5\' 6"  (1.676 m)      Intake/Output Summary (Last 24 hours) at 04/18/2017 1509 Last data filed at 04/18/2017 0900 Gross per 24 hour  Intake 1538.33 ml  Output 600 ml  Net 938.33 ml   Filed Weights   04/17/17 1214 04/17/17 1557  Weight: 102.1 kg (225 lb) 114.4 kg (252 lb 3.2 oz)    Examination:  General exam: Appears calm and comfortable  Respiratory system: Clear to auscultation. Respiratory effort normal. Cardiovascular system: S1 & S2 heard, RRR. No JVD, murmurs, rubs, gallops or clicks. No pedal edema. Gastrointestinal system: Abdomen is obese, nondistended, soft and nontender. No organomegaly or masses felt. Normal bowel sounds heard. Central nervous system: Alert and oriented. No focal neurological deficits. Extremities: Symmetric 5 x 5 power. Skin: No rashes, lesions or ulcers Psychiatry: Judgement and insight appear normal. Mood & affect appropriate.     Data Reviewed: I have personally reviewed following labs and imaging studies  CBC: Recent Labs  Lab 04/16/17 1701 04/17/17 1228 04/17/17 1252 04/17/17 2025 04/18/17 0739  WBC 11.8* 9.1  --  7.2 5.9  NEUTROABS 8,142* 5.5  --  4.4  --   HGB 15.2 14.5 15.6 13.1 13.5  HCT 43.1 44.9 46.0 40.1 41.4  MCV 102.4* 109.5*  --  109.3* 109.8*  PLT 264 224  --  159 166   Basic Metabolic Panel: Recent Labs  Lab 04/16/17 1701 04/17/17 1228 04/17/17 1252 04/18/17 0739  NA 135 135 132* 137  K 3.5 2.5* 2.5* 3.5  CL 86* 85* 86* 96*  CO2 35* 34*  --  32  GLUCOSE 117 100* 102* 109*  BUN 20 21* 19 18  CREATININE 2.41* 2.47* 2.50* 1.70*  CALCIUM 11.4* 10.9*  --  9.7   GFR: Estimated Creatinine Clearance: 56.3 mL/min (A) (by C-G formula based on SCr of 1.7 mg/dL (H)). Liver Function Tests: Recent Labs  Lab 04/16/17 1701 04/17/17 1228 04/18/17 0739  AST 58* 73* 63*  ALT 36 39  38  ALKPHOS  --  95 89  BILITOT 1.0 1.0 1.1  PROT 6.7 7.0 6.5  ALBUMIN  --  4.0 3.7   No results for input(s): LIPASE, AMYLASE in the last 168 hours. No results for input(s): AMMONIA in the last 168 hours. Coagulation Profile: Recent Labs  Lab 04/17/17 2025  INR 0.94   Cardiac Enzymes: Recent Labs  Lab 04/17/17 1228 04/17/17 2025 04/18/17 0137 04/18/17 0739  TROPONINI <0.03 <0.03 <0.03 <0.03   BNP (last 3 results) No results for input(s): PROBNP in the last 8760 hours. HbA1C: No results for input(s): HGBA1C in the last 72 hours. CBG: No results for input(s): GLUCAP in the last 168 hours. Lipid Profile: No results for input(s): CHOL, HDL, LDLCALC, TRIG, CHOLHDL, LDLDIRECT in  the last 72 hours. Thyroid Function Tests: No results for input(s): TSH, T4TOTAL, FREET4, T3FREE, THYROIDAB in the last 72 hours. Anemia Panel: No results for input(s): VITAMINB12, FOLATE, FERRITIN, TIBC, IRON, RETICCTPCT in the last 72 hours. Sepsis Labs: Recent Labs  Lab 04/17/17 1252 04/17/17 1733 04/17/17 2025  PROCALCITON  --   --  0.27  LATICACIDVEN 3.87* 1.6 1.8    Recent Results (from the past 240 hour(s))  Blood Culture (routine x 2)     Status: None (Preliminary result)   Collection Time: 04/17/17  1:32 PM  Result Value Ref Range Status   Specimen Description BLOOD RIGHT HAND  Final   Special Requests   Final    BOTTLES DRAWN AEROBIC AND ANAEROBIC Blood Culture adequate volume   Culture NO GROWTH < 24 HOURS  Final   Report Status PENDING  Incomplete  Blood Culture (routine x 2)     Status: None (Preliminary result)   Collection Time: 04/17/17  1:39 PM  Result Value Ref Range Status   Specimen Description BLOOD RIGHT HAND  Final   Special Requests   Final    BOTTLES DRAWN AEROBIC AND ANAEROBIC Blood Culture adequate volume   Culture NO GROWTH < 24 HOURS  Final   Report Status PENDING  Incomplete         Radiology Studies: Dg Chest Portable 1 View  Result Date:  04/17/2017 CLINICAL DATA:  Weakness EXAM: PORTABLE CHEST 1 VIEW COMPARISON:  03/20/2017 FINDINGS: Emphysema by CT. Haziness of the chest primarily from soft tissue attenuation. There is no edema, consolidation, effusion, or pneumothorax. Borderline heart size accentuated by mediastinal fat pad. Negative mediastinal contours. IMPRESSION: COPD without acute superimposed finding. Electronically Signed   By: Marnee SpringJonathon  Watts M.D.   On: 04/17/2017 13:08        Scheduled Meds: . folic acid  1 mg Oral Daily  . heparin  5,000 Units Subcutaneous Q8H  . HYDROcodone-acetaminophen  1 tablet Oral Q8H  . LORazepam  0-4 mg Intravenous Q6H   Followed by  . [START ON 04/19/2017] LORazepam  0-4 mg Intravenous Q12H  . multivitamin with minerals  1 tablet Oral Daily  . tamsulosin  0.8 mg Oral Daily  . venlafaxine XR  150 mg Oral Q breakfast   Continuous Infusions: . piperacillin-tazobactam (ZOSYN)  IV 3.375 g (04/18/17 1319)  . vancomycin       LOS: 1 day    Time spent: 25 minutes    Katrinka BlazingAlex U Kadolph, MD [facility] Pager [phone]  If 7PM-7AM, please contact night-coverage [email or web address] Password [email or web address] 04/18/2017, 3:09 PM

## 2017-04-19 ENCOUNTER — Inpatient Hospital Stay (HOSPITAL_COMMUNITY): Payer: Commercial Managed Care - PPO

## 2017-04-19 DIAGNOSIS — N179 Acute kidney failure, unspecified: Secondary | ICD-10-CM | POA: Diagnosis not present

## 2017-04-19 DIAGNOSIS — R55 Syncope and collapse: Secondary | ICD-10-CM | POA: Diagnosis not present

## 2017-04-19 DIAGNOSIS — E872 Acidosis: Secondary | ICD-10-CM | POA: Diagnosis not present

## 2017-04-19 DIAGNOSIS — I952 Hypotension due to drugs: Secondary | ICD-10-CM | POA: Diagnosis not present

## 2017-04-19 DIAGNOSIS — E876 Hypokalemia: Secondary | ICD-10-CM | POA: Diagnosis not present

## 2017-04-19 LAB — ECHOCARDIOGRAM COMPLETE
HEIGHTINCHES: 66 in
Weight: 4035.2 oz

## 2017-04-19 LAB — HIV ANTIBODY (ROUTINE TESTING W REFLEX): HIV SCREEN 4TH GENERATION: NONREACTIVE

## 2017-04-19 LAB — URINE CULTURE: Culture: NO GROWTH

## 2017-04-19 LAB — BASIC METABOLIC PANEL
ANION GAP: 11 (ref 5–15)
BUN: 14 mg/dL (ref 6–20)
CALCIUM: 9.8 mg/dL (ref 8.9–10.3)
CO2: 31 mmol/L (ref 22–32)
Chloride: 96 mmol/L — ABNORMAL LOW (ref 101–111)
Creatinine, Ser: 1.5 mg/dL — ABNORMAL HIGH (ref 0.61–1.24)
GFR calc Af Amer: 58 mL/min — ABNORMAL LOW (ref >60)
GFR, EST NON AFRICAN AMERICAN: 50 mL/min — AB (ref >60)
GLUCOSE: 105 mg/dL — AB (ref 65–99)
POTASSIUM: 3.3 mmol/L — AB (ref 3.5–5.1)
SODIUM: 138 mmol/L (ref 135–145)

## 2017-04-19 MED ORDER — METOPROLOL SUCCINATE ER 25 MG PO TB24
25.0000 mg | ORAL_TABLET | Freq: Every day | ORAL | Status: DC
  Administered 2017-04-19: 25 mg via ORAL
  Filled 2017-04-19: qty 1

## 2017-04-19 NOTE — Plan of Care (Signed)
  Acute Rehab PT Goals(only PT should resolve) Patient Will Transfer Sit To/From Stand 04/19/2017 0847 - Progressing by Ocie BobWatkins, Truong Delcastillo, PT Flowsheets Taken 04/19/2017 [phone]  Patient will transfer sit to/from stand with modified independence Pt Will Transfer Bed To Chair/Chair To Bed 04/19/2017 0847 - Progressing by Ocie BobWatkins, Mars Scheaffer, PT Flowsheets Taken 04/19/2017 0847  Pt will Transfer Bed to Chair/Chair to Bed with modified independence Pt Will Ambulate 04/19/2017 0847 - Progressing by Ocie BobWatkins, Jolonda Gomm, PT Flowsheets Taken 04/19/2017 0847  Pt will Ambulate 75 feet;with modified independence;with cane Pt Will Go Up/Down Stairs 04/19/2017 0847 - Progressing by Ocie BobWatkins, Kashon Kraynak, PT Flowsheets Taken 04/19/2017 0847  Pt will Go Up / Down Stairs 3-5 stairs;with rail(s);with modified independence  8:48 AM, 04/19/17 Ocie BobJames Merelin Rivas, MPT Physical Therapist with [facility] [phone] office [phone] mobile phone

## 2017-04-19 NOTE — Progress Notes (Signed)
2D Echocardiogram has been performed.  Adrian Rivas 04/19/2017, 10:37 AM

## 2017-04-19 NOTE — Evaluation (Signed)
Physical Therapy Evaluation Patient Details Name: Adrian Rivas MRN: [id number] DOB: 05-May-1961 Today's Date: 04/19/2017   History of Present Illness  Adrian Rivas is a 56 y.o. male with medical history significant of COPD, dyslipidemia, depression, hypertension, chronic pain presents the emergency department after being told to come in from labs that were drawn that were abnormal.  Patient saw his primary care physician Dr. Delton SeeNelson yesterday and complained of fatigue as well as weakness.  His blood pressure at that time was 70 systolic and he was told to stop 1 of his blood pressure medications.  He was also sent for labs, chest x-ray, and a head CT per his wife for concerns of recent confusion as well as word finding difficulties.  Upon reviewing the record patient was in the emergency department on 03-20-2017 with a similar complaint of fatigue for which he signed himself out AMA.  At that time his lactic acid was also elevated.  Patient denies fevers or chills.  He denies chest pain, chest pressure, shortness of breath, nausea or vomiting.  He later admits that he has been drinking alcohol but would not quantify an amount.  He states his last drink was yesterday in which he consumed 1 ounce of vodka.  On previous emergency room visit on 03/20/2017 patient's alcohol level was found to be greater than 150.  Patient denies any sick contacts.  His wife reports that for the past 3-4 months he has lost a significant amount of weight and he has had issues where he takes longer than normal to respond to questioning.  She reports that he is also been falling more frequently at home.  She does state that she has not been able to take his blood pressure as she is supposed to daily at home since Thanksgiving.  Upon reviewing patient's note from his primary care physician yesterday Dr. Delton SeeNelson expressed concern over patient's declining health.  She also order an echocardiogram.    Clinical Impression  Patient  functioning near baseline for functional mobility and gait, demonstrates slightly labored slow movement for sitting up and walking in hallway, and limited mostly due to c/o fatigue.  Patient will benefit from continued physical therapy in hospital and recommended venue below to increase strength, balance, endurance for safe ADLs and gait.    Follow Up Recommendations Home health PT;Supervision - Intermittent    Equipment Recommendations  None recommended by PT    Recommendations for Other Services       Precautions / Restrictions Precautions Precautions: None Restrictions Weight Bearing Restrictions: No      Mobility  Bed Mobility Overal bed mobility: Modified Independent                Transfers Overall transfer level: Needs assistance Equipment used: Straight cane Transfers: Sit to/from Stand;Stand Pivot Transfers Sit to Stand: Supervision Stand pivot transfers: Supervision       General transfer comment: slow slightly labored movement  Ambulation/Gait Ambulation/Gait assistance: Supervision Ambulation Distance (Feet): 50 Feet Assistive device: Straight cane Gait Pattern/deviations: WFL(Within Functional Limits)   Gait velocity interpretation: Below normal speed for age/gender General Gait Details: grossly WFL except slow slightly labored cadence, on 2 LPM O2 during   Stairs            Wheelchair Mobility    Modified Rankin (Stroke Patients Only)       Balance Overall balance assessment: No apparent balance deficits (not formally assessed)  Pertinent Vitals/Pain Pain Assessment: 0-10 Pain Score: 8  Pain Location: chronic low back  Pain Descriptors / Indicators: Aching Pain Intervention(s): Limited activity within patient's tolerance;Monitored during session    Home Living Family/patient expects to be discharged to:: Private residence Living Arrangements: Spouse/significant  other Available Help at Discharge: Family Type of Home: House Home Access: Stairs to enter Entrance Stairs-Rails: Right;Left;Can reach both Entrance Stairs-Number of Steps: 3 Home Layout: One level Home Equipment: Environmental consultantWalker - 2 wheels;Cane - single point;Shower seat - built in;Walker - 4 wheels      Prior Function Level of Independence: Independent with assistive device(s)         Comments: short distanced community gait with SPC     Hand Dominance        Extremity/Trunk Assessment   Upper Extremity Assessment Upper Extremity Assessment: Generalized weakness    Lower Extremity Assessment Lower Extremity Assessment: Generalized weakness    Cervical / Trunk Assessment Cervical / Trunk Assessment: Normal  Communication   Communication: No difficulties  Cognition Arousal/Alertness: Awake/alert Behavior During Therapy: WFL for tasks assessed/performed Overall Cognitive Status: Within Functional Limits for tasks assessed                                        General Comments      Exercises     Assessment/Plan    PT Assessment Patient needs continued PT services  PT Problem List Decreased strength;Decreased activity tolerance;Decreased mobility       PT Treatment Interventions Gait training;Stair training;Functional mobility training;Therapeutic activities;Patient/family education    PT Goals (Current goals can be found in the Care Plan section)  Acute Rehab PT Goals Patient Stated Goal: return home PT Goal Formulation: With patient Time For Goal Achievement: 04/21/17 Potential to Achieve Goals: Good    Frequency Min 3X/week   Barriers to discharge        Co-evaluation               AM-PAC PT "6 Clicks" Daily Activity  Outcome Measure Difficulty turning over in bed (including adjusting bedclothes, sheets and blankets)?: None Difficulty moving from lying on back to sitting on the side of the bed? : None Difficulty sitting down  on and standing up from a chair with arms (e.g., wheelchair, bedside commode, etc,.)?: None Help needed moving to and from a bed to chair (including a wheelchair)?: A Little Help needed walking in hospital room?: A Little Help needed climbing 3-5 steps with a railing? : A Little 6 Click Score: 21    End of Session   Activity Tolerance: Patient tolerated treatment well;Patient limited by fatigue Patient left: in chair;with call bell/phone within reach Nurse Communication: Mobility status PT Visit Diagnosis: Unsteadiness on feet (R26.81);Other abnormalities of gait and mobility (R26.89);Muscle weakness (generalized) (M62.81)    Time: [id number] PT Time Calculation (min) (ACUTE ONLY): 28 min   Charges:   PT Evaluation $PT Eval Low Complexity: 1 Low PT Treatments $Therapeutic Activity: 23-37 mins   PT G Codes:   PT G-Codes **NOT FOR INPATIENT CLASS** Functional Assessment Tool Used: AM-PAC 6 Clicks Basic Mobility Functional Limitation: Mobility: Walking and moving around Mobility: Walking and Moving Around Current Status ([id number]([id number]): At least 20 percent but less than 40 percent impaired, limited or restricted Mobility: Walking and Moving Around Goal Status [phone]([id number]): At least 20 percent but less than 40 percent impaired, limited or restricted Mobility: Walking and  Moving Around Discharge Status [phone]): At least 20 percent but less than 40 percent impaired, limited or restricted    8:45 AM, 04/19/17 Ocie Bob, MPT Physical Therapist with [facility] 336 [phone] office [phone] mobile phone

## 2017-04-19 NOTE — Progress Notes (Signed)
Patient states understanding of discharge instructions.  

## 2017-04-19 NOTE — Discharge Summary (Signed)
Physician Discharge Summary  Adrian Rivas ZHY:[id number] DOB: August 19, 1960 DOA: 04/17/2017  PCP: Adrian Moore, MD  Admit date: 04/17/2017 Discharge date: 04/19/2017  Admitted From: Home Disposition: Home  Recommendations for Outpatient Follow-up:  1. Follow up with PCP in 1 week 2. Monitor blood pressure at home at least twice daily 3. Please obtain BMP/CBC in one week 4. Home health set up for you  Home Health: Yes, PT, RN, aide Equipment/Devices: None  Discharge Condition: Stable CODE STATUS: Full code Diet recommendation:  Brief/Interim Summary: Adrian Rivas a 56 y.o.malewith medical history significant of COPD, dyslipidemia, depression, hypertension, chronic pain presents the emergency department after being told to come in from labs that were drawn that were abnormal. Patient saw his primary care physician Dr. Delton Rivas yesterday and complained of fatigue as well as weakness. His blood pressure at that time was 70 systolic and he was told to stop 1 of his blood pressure medications. He was also sent for labs, chest x-ray, and a head CT per his wife for concerns of recent confusion as well as word finding difficulties. Upon reviewing the record patient was in the emergency department on 03-20-2017 with a similar complaint of fatigue for which he signed himself out AMA. At that time his lactic acid was also elevated. Patient denies fevers or chills. He denies chest pain, chest pressure, shortness of breath, nausea or vomiting. He later admits that he has been drinking alcohol but would not quantify an amount. He states his last drink was yesterday in which he consumed 1 ounce of vodka. On previous emergency room visit on 03/20/2017 patient's alcohol level was found to be greater than 150. Patient denies any sick contacts. His wife reports that for the past 3-4 months he has lost a significant amount of weight and he has had issues where he takes longer than normal to  respond to questioning. She reports that he is also been falling more frequently at home. She does state that she has not been able to take his blood pressure as she is supposed to daily at home since Thanksgiving. Upon reviewing patient's note from his primary care physician yesterday Dr. Delton Rivas expressed concern over patient's declining health. She also order an echocardiogram.  Patient was admitted to the hospital for acute renal failure as well as hypotension.  His antihypertensives were held and his blood pressure improved after numerous IV boluses.  He was seen by physical therapy during his admission and they recommended home health PT.  He had an echocardiogram and renal ultrasound during his admission.  Patient did report he had consistent alcohol usage but was barely scoring on CIWA protocol for the 24 hours prior to discharge. He was given his metoprolol on morning of discharge with improvement of his heart rate.  He was instructed to take his metoprolol only after discharge and monitor his blood pressure at home twice a day.  Discharge Diagnoses:  Principal Problem:   Hypotension Active Problems:   Lactic acidosis   Acute renal failure (ARF) (HCC)    Discharge Instructions  Discharge Instructions    Call MD for:  difficulty breathing, headache or visual disturbances   Complete by:  As directed    Call MD for:  extreme fatigue   Complete by:  As directed    Call MD for:  hives   Complete by:  As directed    Call MD for:  persistant dizziness or light-headedness   Complete by:  As directed  Call MD for:  persistant nausea and vomiting   Complete by:  As directed    Call MD for:  severe uncontrolled pain   Complete by:  As directed    Call MD for:  temperature >100.4   Complete by:  As directed    Diet - low sodium heart healthy   Complete by:  As directed    Discharge instructions   Complete by:  As directed    Take metoprolol as prescribed but stop other blood  pressure medications Follow up with Dr. Delton Rivas outpatient Monitor blood pressure at home BID   Increase activity slowly   Complete by:  As directed      Allergies as of 04/19/2017      Reactions   No Known Allergies       Medication List    STOP taking these medications   hydrochlorothiazide 25 MG tablet Commonly known as:  HYDRODIURIL     TAKE these medications   albuterol 108 (90 Base) MCG/ACT inhaler Commonly known as:  PROVENTIL HFA;VENTOLIN HFA Inhale 2 puffs into the lungs every 6 (six) hours as needed for wheezing or shortness of breath.   aspirin EC 81 MG tablet Take 81 mg every evening by mouth.   cholecalciferol 400 units Tabs tablet Commonly known as:  VITAMIN D Take 400 Units by mouth daily.   desvenlafaxine 100 MG 24 hr tablet Commonly known as:  PRISTIQ Take 100 mg by mouth every morning.   HYDROcodone-acetaminophen 10-325 MG tablet Commonly known as:  NORCO Take 1 tablet by mouth every 8 (eight) hours.   ipratropium-albuterol 0.5-2.5 (3) MG/3ML Soln Commonly known as:  DUONEB Take 3 mLs by nebulization every 4 (four) hours as needed.   lamoTRIgine 100 MG tablet Commonly known as:  LAMICTAL Take 100-200 mg 2 (two) times daily by mouth. TAKES 200 mg in the morning and 100mg  in the afternoon   methocarbamol 500 MG tablet Commonly known as:  ROBAXIN Take 1 tablet (500 mg total) by mouth every 6 (six) hours as needed for muscle spasms. What changed:  when to take this   metoprolol succinate 50 MG 24 hr tablet Commonly known as:  TOPROL-XL Take 1 tablet (50 mg total) by mouth daily. Take with or immediately following a meal.   mometasone 50 MCG/ACT nasal spray Commonly known as:  NASONEX Place 2 sprays into the nose daily as needed.   multivitamin with minerals Tabs tablet Take 1 tablet by mouth daily.   potassium chloride 10 MEQ tablet Commonly known as:  K-DUR Take 1 tablet (10 mEq total) by mouth daily.   REXULTI 1 MG Tabs Generic drug:   Brexpiprazole Take 1 tablet daily by mouth.   rosuvastatin 40 MG tablet Commonly known as:  CRESTOR Take 40 mg every evening by mouth.   tamsulosin 0.4 MG Caps capsule Commonly known as:  FLOMAX Take 2 capsules (0.8 mg total) by mouth daily.   tiotropium 18 MCG inhalation capsule Commonly known as:  SPIRIVA Place 1 capsule (18 mcg total) into inhaler and inhale daily.   zolpidem 10 MG tablet Commonly known as:  AMBIEN Take 10 mg at bedtime by mouth.      Follow-up Information    [facility] Follow [facility].   Specialty:  Home Health Services Why:  Physical therapist will contact you for first home visit. Contact information: [address] [place] [address] [phone]        Adrian Moore, MD. Schedule  an appointment as soon as possible for a visit in 1 week(s).   Specialty:  Family Medicine Contact information: [address] [place] [address] [phone]          Allergies  Allergen Reactions  . No Known Allergies     Consultations:  Physical therapy   Procedures/Studies: Dg Chest 2 View  Result Date: 03/20/2017 CLINICAL DATA:  Shortness of breath and hypoxia EXAM: CHEST  2 VIEW COMPARISON:  02/15/2016 FINDINGS: Mild interstitial coarsening that is similar to prior. Patient has emphysema by CT in 2009. Normal heart size. Negative mediastinal contours. There is no edema, consolidation, effusion, or pneumothorax. Prominent spondylosis. IMPRESSION: Interstitial coarsening likely from patient's COPD. No acute superimposed finding. Electronically Signed   By: Marnee SpringJonathon  Watts M.D.   On: 03/20/2017 17:24   Ct Head Wo Contrast  Result Date: 03/20/2017 CLINICAL DATA:  Increased weakness for the past 2 months. Altered mental status. Hypotension. EXAM: CT HEAD WITHOUT CONTRAST TECHNIQUE: Contiguous axial images were obtained from the base of the skull through the vertex without intravenous contrast. COMPARISON:  None.  FINDINGS: Brain: S Diffusely enlarged ventricles and subarachnoid spaces. Patchy white matter low density in both cerebral hemispheres. No intracranial hemorrhage, mass lesion or CT evidence of acute infarction. Vascular: No hyperdense vessel or unexpected calcification. Skull: Normal. Negative for fracture or focal lesion. Sinuses/Orbits: Unremarkable. Other: None. IMPRESSION: 1. No acute abnormality. 2. Mild diffuse cerebral and cerebellar atrophy. 3. Minimal chronic small vessel white matter ischemic changes in both cerebral hemispheres. Electronically Signed   By: Beckie SaltsSteven  Reid M.D.   On: 03/20/2017 19:15   Ct Angio Chest Pe W And/or Wo Contrast  Result Date: 03/20/2017 CLINICAL DATA:  Shortness of breath and hypoxia. Increased weakness. Altered mental status for the past 2 months. EXAM: CT ANGIOGRAPHY CHEST WITH CONTRAST TECHNIQUE: Multidetector CT imaging of the chest was performed using the standard protocol during bolus administration of intravenous contrast. Multiplanar CT image reconstructions and MIPs were obtained to evaluate the vascular anatomy. CONTRAST:  100mL ISOVUE-370 IOPAMIDOL (ISOVUE-370) INJECTION 76% COMPARISON:  Portable chest obtained earlier today. Chest CT dated 04/24/2008, 10/19/2007 and 04/08/2007. FINDINGS: Cardiovascular: Normally opacified pulmonary arteries with no pulmonary arterial filling defects. Atheromatous arterial calcifications, including the thoracic aorta. Mediastinum/Nodes: No enlarged mediastinal, hilar, or axillary lymph nodes. Thyroid gland, trachea, and esophagus demonstrate no significant findings. Lungs/Pleura: Diffuse increase in prominence of the interstitial markings since 04/24/2008. Mild bilateral dependent atelectasis. No pleural fluid. 4 mm left lower lobe nodule on image number 98 series 7 without significant change since 04/08/2007. A previously demonstrated right lower lobe nodule is not currently visualized and a previously demonstrated tiny right  middle lobe nodule is also not currently visualized. No new nodules are seen. Mild bilateral bullous changes without significant change. Upper Abdomen: Diffuse low density of the liver relative to the spleen, with progression. Musculoskeletal: Thoracic spine degenerative changes with changes of DISH. Cervical spine fixation hardware. Review of the MIP images confirms the above findings. IMPRESSION: 1. No pulmonary emboli. 2. Stable changes of COPD with the exception of progressive interstitial lung disease. 3. No suspicious lung nodules. 4. Diffuse hepatic steatosis. 5. Mild calcified aortic atherosclerosis. Aortic Atherosclerosis (ICD10-I70.0) and Emphysema (ICD10-J43.9). Electronically Signed   By: Beckie SaltsSteven  Reid M.D.   On: 03/20/2017 21:17   [place] Renal  Result Date: 04/19/2017 CLINICAL DATA:  Acute renal injury EXAM: RENAL / URINARY TRACT ULTRASOUND COMPLETE COMPARISON:  None. FINDINGS: Right Kidney: Length: 12.2 cm. Echogenicity within  normal limits. No mass or hydronephrosis visualized. Left Kidney: Length: 14.2 cm. Echogenicity within normal limits. No mass or hydronephrosis visualized. Bladder: Appears normal for degree of bladder distention. Note is made of increased echogenicity within the liver likely related to fatty infiltration. IMPRESSION: No acute abnormality noted. Electronically Signed   By: Alcide Clever M.D.   On: 04/19/2017 14:23   Dg Chest Portable 1 View  Result Date: 04/17/2017 CLINICAL DATA:  Weakness EXAM: PORTABLE CHEST 1 VIEW COMPARISON:  03/20/2017 FINDINGS: Emphysema by CT. Haziness of the chest primarily from soft tissue attenuation. There is no edema, consolidation, effusion, or pneumothorax. Borderline heart size accentuated by mediastinal fat pad. Negative mediastinal contours. IMPRESSION: COPD without acute superimposed finding. Electronically Signed   By: Marnee Spring M.D.   On: 04/17/2017 13:08    Echocardiogram:Technically difficult study with poor acoustic windows.  Normal LV   size and systolic function, EF 60-65%. Mildly dilated RV with   mildly decreased systolic function. No significant valvular   abnormalities  Subjective: Patient seen and evaluated.  He is hopeful to go home today.  States he feels significantly better.  Heart rate has been in the low 100s but patient was just given metoprolol this am.   Discharge Exam: Vitals:   04/18/17 2148 04/19/17 0643  BP:  (!) 155/81  Pulse:  (!) 115  Resp:  20  Temp: 98.5 F (36.9 C) 98.1 F (36.7 C)  SpO2:  95%   Vitals:   04/18/17 0512 04/18/17 2007 04/18/17 2148 04/19/17 0643  BP: 132/71   (!) 155/81  Pulse: 91   (!) 115  Resp: 20   20  Temp: (!) 97.1 F (36.2 C)  98.5 F (36.9 C) 98.1 F (36.7 C)  TempSrc: Oral   Oral  SpO2: 97% 95%  95%  Weight:      Height:        General: Pt is alert, awake, not in acute distress Cardiovascular: RRR, S1/S2 +, no rubs, no gallops Respiratory: CTA bilaterally, no wheezing, no rhonchi Abdominal: Soft, NT, ND, bowel sounds + Extremities: no edema, no cyanosis    The results of significant diagnostics from this hospitalization (including imaging, microbiology, ancillary and laboratory) are listed below for reference.     Microbiology: Recent Results (from the past 240 hour(s))  Blood Culture (routine x 2)     Status: None (Preliminary result)   Collection Time: 04/17/17  1:32 PM  Result Value Ref Range Status   Specimen Description BLOOD RIGHT HAND  Final   Special Requests   Final    BOTTLES DRAWN AEROBIC AND ANAEROBIC Blood Culture adequate volume   Culture NO GROWTH 2 DAYS  Final   Report Status PENDING  Incomplete  Blood Culture (routine x 2)     Status: None (Preliminary result)   Collection Time: 04/17/17  1:39 PM  Result Value Ref Range Status   Specimen Description BLOOD RIGHT HAND  Final   Special Requests   Final    BOTTLES DRAWN AEROBIC AND ANAEROBIC Blood Culture adequate volume   Culture NO GROWTH 2 DAYS  Final   Report  Status PENDING  Incomplete  Urine Culture     Status: None   Collection Time: 04/17/17  2:35 PM  Result Value Ref Range Status   Specimen Description URINE, CLEAN CATCH  Final   Special Requests NONE  Final   Culture   Final    NO GROWTH Performed at [facility], [address], [place], [place] [address]    Report Status 04/19/2017 FINAL  Final     Labs: BNP (last 3 results) Recent Labs    03/20/17 1648 04/16/17 1701  BNP 37.0 18   Basic Metabolic Panel: Recent Labs  Lab 04/16/17 1701 04/17/17 1228 04/17/17 1252 04/18/17 0739 04/19/17 0820  NA 135 135 132* 137 138  K 3.5 2.5* 2.5* 3.5 3.3*  CL 86* 85* 86* 96* 96*  CO2 35* 34*  --  32 31  GLUCOSE 117 100* 102* 109* 105*  BUN 20 21* 19 18 14   CREATININE 2.41* 2.47* 2.50* 1.70* 1.50*  CALCIUM 11.4* 10.9*  --  9.7 9.8   Liver Function Tests: Recent Labs  Lab 04/16/17 1701 04/17/17 1228 04/18/17 0739  AST 58* 73* 63*  ALT 36 39 38  ALKPHOS  --  95 89  BILITOT 1.0 1.0 1.1  PROT 6.7 7.0 6.5  ALBUMIN  --  4.0 3.7   No results for input(s): LIPASE, AMYLASE in the last 168 hours. No results for input(s): AMMONIA in the last 168 hours. CBC: Recent Labs  Lab 04/16/17 1701 04/17/17 1228 04/17/17 1252 04/17/17 2025 04/18/17 0739  WBC 11.8* 9.1  --  7.2 5.9  NEUTROABS 8,142* 5.5  --  4.4  --   HGB 15.2 14.5 15.6 13.1 13.5  HCT 43.1 44.9 46.0 40.1 41.4  MCV 102.4* 109.5*  --  109.3* 109.8*  PLT 264 224  --  159 166   Cardiac Enzymes: Recent Labs  Lab 04/17/17 1228 04/17/17 2025 04/18/17 0137 04/18/17 0739  TROPONINI <0.03 <0.03 <0.03 <0.03   BNP: Invalid input(s): POCBNP CBG: No results for input(s): GLUCAP in the last 168 hours. D-Dimer No results for input(s): DDIMER in the last 72 hours. Hgb A1c No results for input(s): HGBA1C in the last 72 hours. Lipid Profile No results for input(s): CHOL, HDL, LDLCALC, TRIG, CHOLHDL, LDLDIRECT in the last 72 hours. Thyroid function studies No  results for input(s): TSH, T4TOTAL, T3FREE, THYROIDAB in the last 72 hours.  Invalid input(s): FREET3 Anemia work up No results for input(s): VITAMINB12, FOLATE, FERRITIN, TIBC, IRON, RETICCTPCT in the last 72 hours. Urinalysis    Component Value Date/Time   COLORURINE YELLOW 04/17/2017 1435   APPEARANCEUR CLEAR 04/17/2017 1435   LABSPEC 1.010 04/17/2017 1435   PHURINE 5.5 04/17/2017 1435   GLUCOSEU 100 (A) 04/17/2017 1435   HGBUR NEGATIVE 04/17/2017 1435   BILIRUBINUR NEGATIVE 04/17/2017 1435   KETONESUR NEGATIVE 04/17/2017 1435   PROTEINUR NEGATIVE 04/17/2017 1435   UROBILINOGEN 0.2 03/17/2008 2144   NITRITE NEGATIVE 04/17/2017 1435   LEUKOCYTESUR NEGATIVE 04/17/2017 1435   Sepsis Labs Invalid input(s): PROCALCITONIN,  WBC,  LACTICIDVEN Microbiology Recent Results (from the past 240 hour(s))  Blood Culture (routine x 2)     Status: None (Preliminary result)   Collection Time: 04/17/17  1:32 PM  Result Value Ref Range Status   Specimen Description BLOOD RIGHT HAND  Final   Special Requests   Final    BOTTLES DRAWN AEROBIC AND ANAEROBIC Blood Culture adequate volume   Culture NO GROWTH 2 DAYS  Final   Report Status PENDING  Incomplete  Blood Culture (routine x 2)     Status: None (Preliminary result)   Collection Time: 04/17/17  1:39 PM  Result Value Ref Range Status   Specimen Description BLOOD RIGHT HAND  Final   Special Requests   Final    BOTTLES DRAWN AEROBIC AND ANAEROBIC Blood Culture adequate volume  Culture NO GROWTH 2 DAYS  Final   Report Status PENDING  Incomplete  Urine Culture     Status: None   Collection Time: 04/17/17  2:35 PM  Result Value Ref Range Status   Specimen Description URINE, CLEAN CATCH  Final   Special Requests NONE  Final   Culture   Final    NO GROWTH Performed at [facility], [address] [place], [place] [address]    Report Status 04/19/2017 FINAL  Final     Time coordinating discharge: 35 minutes  SIGNED:   Katrinka BlazingAlex U  Kadolph, MD  [facility] 04/19/2017, 2:37 PM Pager [phone] If 7PM-7AM, please contact night-coverage [email or web address] Password [email or web address]

## 2017-04-19 NOTE — Care Management Note (Signed)
Case Management Note  Patient Details  Name: Adrian Rivas MRN: [id number] Date of Birth: [birth year]  Subjective/Objective:      Pt presented for abnormal labs and hypotension. Pt from home with wife where he has been experiencing frequent falls.  Pt has walker, cane, shower bench.         Action/Plan: Choice offered to pt via phone.  Pt chose [facility] for PT.  Pt reluctant but agrees to assessment.  Jermaine given referral and SOC to begin within 24-48 hours.  Expected Discharge Date:                  Expected Discharge Plan:  Home w Home Health Services  In-House Referral:  NA  Discharge planning Services  CM Consult  Post Acute Care Choice:  Home Health Choice offered to:  Patient  DME Arranged:  N/A DME Agency:  NA  HH Arranged:  PT HH Agency:  [facility]  Status of Service:  Completed, signed off  If discussed at Long Length of Stay Meetings, dates discussed:    Additional Comments:  Verdene LennertGoldean, Yaelis Scharfenberg K, RN 04/19/2017, 1:55 PM

## 2017-04-22 ENCOUNTER — Telehealth: Payer: Self-pay

## 2017-04-22 LAB — CULTURE, BLOOD (ROUTINE X 2)
CULTURE: NO GROWTH
CULTURE: NO GROWTH
Special Requests: ADEQUATE
Special Requests: ADEQUATE

## 2017-04-22 NOTE — Telephone Encounter (Signed)
Left message to call office

## 2017-04-22 NOTE — Telephone Encounter (Signed)
Transition Care Management Follow-up Telephone Call   Date discharged? 04/19/2017           How have you been since you were released from the hospital? Hes has been doing much better. He was dehydrated and he has been drinking plenty of water since coming home.   Do you understand why you were in the hospital? yes   Do you understand the discharge instructions?  Left at the hospital. Micah FlesherWent over with patient.   Where were you discharged to? Home   Items Reviewed:  Medications reviewed: yes  Allergies reviewed: yes, nka  Dietary changes reviewed: no   Referrals reviewed: home health referral done and they have tried to contact but they missed call. Will give them the info   Functional Questionnaire:   Activities of Daily Living (ADLs):  wife assisting with care, patient not receptive to home health at this time.     Any transportation issues/concerns?: no issues with transport    Any patient concerns? none at this time    Confirmed importance and date/time of follow-up visits scheduled Scheduled for 04/27/17 at 9am. Patient/wife aware     Confirmed with patient if condition begins to worsen call PCP or go to the ER.  Patient was given the office number and encouraged to call back with question or concerns.  :

## 2017-04-22 NOTE — Telephone Encounter (Signed)
Transition Care Management Follow-up Telephone Call   Date discharged?                How have you been since you were released from the hospital?    Do you understand why you were in the hospital?    Do you understand the discharge instructions?   Where were you discharged to?    Items Reviewed:  Medications reviewed:   Allergies reviewed:   Dietary changes reviewed:   Referrals reviewed:    Functional Questionnaire:   Activities of Daily Living (ADLs):      Any transportation issues/concerns?:    Any patient concerns?    Confirmed importance and date/time of follow-up visits scheduled      Confirmed with patient if condition begins to worsen call PCP or go to the ER.  Patient was given the office number and encouraged to call back with question or concerns.  :   

## 2017-04-22 NOTE — Telephone Encounter (Deleted)
-----   Message from Eustace MooreYvonne Sue Nelson, MD sent at 04/22/2017 11:38 AM EST ----- Will need a transitional ----- Message ----- From: Crawford GivensPutman, Selena M, LPN Sent: [id number]/18/2018   3:34 PM To: Eustace MooreYvonne Sue Nelson, MD  Do you want to see him back? ----- Message ----- From: Eustace MooreNelson, Yvonne Sue, MD Sent: 04/20/2017   2:36 PM To: Crawford GivensSelena M Putman, LPN  They didn't find out what was the underlying issue.  I am disappointed.  See his DC summary

## 2017-04-23 ENCOUNTER — Institutional Professional Consult (permissible substitution): Payer: Commercial Managed Care - PPO | Admitting: Internal Medicine

## 2017-04-24 ENCOUNTER — Institutional Professional Consult (permissible substitution): Payer: Commercial Managed Care - PPO | Admitting: Internal Medicine

## 2017-04-27 ENCOUNTER — Encounter: Payer: Self-pay | Admitting: Family Medicine

## 2017-04-27 ENCOUNTER — Other Ambulatory Visit: Payer: Self-pay

## 2017-04-27 ENCOUNTER — Ambulatory Visit (INDEPENDENT_AMBULATORY_CARE_PROVIDER_SITE_OTHER): Payer: Commercial Managed Care - PPO | Admitting: Family Medicine

## 2017-04-27 VITALS — BP 136/88 | HR 96 | Temp 97.4°F | Resp 20 | Ht 66.0 in | Wt 252.0 lb

## 2017-04-27 DIAGNOSIS — I10 Essential (primary) hypertension: Secondary | ICD-10-CM

## 2017-04-27 DIAGNOSIS — J439 Emphysema, unspecified: Secondary | ICD-10-CM | POA: Diagnosis not present

## 2017-04-27 DIAGNOSIS — Z09 Encounter for follow-up examination after completed treatment for conditions other than malignant neoplasm: Secondary | ICD-10-CM | POA: Diagnosis not present

## 2017-04-27 DIAGNOSIS — N179 Acute kidney failure, unspecified: Secondary | ICD-10-CM

## 2017-04-27 DIAGNOSIS — F1020 Alcohol dependence, uncomplicated: Secondary | ICD-10-CM | POA: Insufficient documentation

## 2017-04-27 DIAGNOSIS — Z23 Encounter for immunization: Secondary | ICD-10-CM

## 2017-04-27 LAB — BASIC METABOLIC PANEL WITH GFR
BUN: 8 mg/dL (ref 7–25)
CHLORIDE: 103 mmol/L (ref 98–110)
CO2: 27 mmol/L (ref 20–32)
CREATININE: 0.96 mg/dL (ref 0.70–1.33)
Calcium: 9.6 mg/dL (ref 8.6–10.3)
GFR, Est African American: 101 mL/min/{1.73_m2} (ref >=60)
GFR, Est Non African American: 87 mL/min/{1.73_m2} (ref >=60)
GLUCOSE: 108 mg/dL — AB (ref 65–99)
POTASSIUM: 3.8 mmol/L (ref 3.5–5.3)
SODIUM: 140 mmol/L (ref 135–146)

## 2017-04-27 NOTE — Progress Notes (Signed)
Chief Complaint  Patient presents with  . Transitions Of Care   Patient was admitted to the hospital for 2 days on 04/17/2017 to 04/19/2017.  He was seen in the office on 12/13 and had blood work drawn that identified acute kidney failure.  I called him the following day and told him to go to the emergency room.  He had become hypotensive.  He was dehydrated.  He had AKI that resolved with fluids.  An echocardiogram performed was normal.  He is here in follow-up feeling better.  He states he is trying to drink more fluids.  His appetite is poor but his weight is stable at this visit.  He has chronic COPD and shortness of breath that is stable.  He is compliant with his medication.  His only blood pressure medicine at this time is metoprolol.  He states he took it this morning, however his heart rate is over 80.  The lab tests, imaging, and doctor's notes from his hospitalization were reviewed and discussed with the patient.    I notice on 1 of his emergency room visits he had a positive alcohol Level, and in the recent HPI it indicates that he had been drinking vodka.  He is admitted alcoholic who told me that he had quit drinking 15 years ago.  I expressed my concern that he is drinking alcohol especially with the combination of medications he is on including opiates, and Ambien.  These do not mix well with vodka.  He risks serious sedation, even overdose and death.  He tells me he generally has 1 beverage with vodka every other day.  I will confirm this with his wife when she is available.  I have advised him strongly to quit  Patient Active Problem List   Diagnosis Date Noted  . Alcoholism, chronic (HCC) 04/27/2017  . Hypotension 04/17/2017  . Lactic acidosis 04/17/2017  . Acute renal failure (ARF) (HCC) 04/17/2017  . Substance abuse in remission (HCC) 09/12/2016  . Morbid obesity (HCC)   . Primary insomnia   . Benign prostatic hyperplasia without lower urinary tract symptoms   .  Hyperlipidemia   . Abnormality of gait   . COPD (chronic obstructive pulmonary disease) (HCC)   . HTN (hypertension)   . Spinal stenosis of lumbar region 10/20/2012    Outpatient Encounter Medications as of 04/27/2017  Medication Sig  . albuterol (PROVENTIL HFA;VENTOLIN HFA) 108 (90 Base) MCG/ACT inhaler Inhale 2 puffs into the lungs every 6 (six) hours as needed for wheezing or shortness of breath.  Marland Kitchen. aspirin EC 81 MG tablet Take 81 mg every evening by mouth.  . cholecalciferol (VITAMIN D) 400 UNITS TABS Take 400 Units by mouth daily.  Marland Kitchen. desvenlafaxine (PRISTIQ) 100 MG 24 hr tablet Take 100 mg by mouth every morning.  Marland Kitchen. HYDROcodone-acetaminophen (NORCO) 10-325 MG tablet Take 1 tablet by mouth every 8 (eight) hours.   Marland Kitchen. ipratropium-albuterol (DUONEB) 0.5-2.5 (3) MG/3ML SOLN Take 3 mLs by nebulization every 4 (four) hours as needed.  . lamoTRIgine (LAMICTAL) 100 MG tablet Take 100-200 mg 2 (two) times daily by mouth. TAKES 200 mg in the morning and 100mg  in the afternoon  . methocarbamol (ROBAXIN) 500 MG tablet Take 1 tablet (500 mg total) by mouth every 6 (six) hours as needed for muscle spasms. (Patient taking differently: Take 500 mg 3 (three) times daily by mouth. )  . metoprolol succinate (TOPROL-XL) 50 MG 24 hr tablet Take 1 tablet (50 mg total) by mouth  daily. Take with or immediately following a meal.  . mometasone (NASONEX) 50 MCG/ACT nasal spray Place 2 sprays into the nose daily as needed.  . Multiple Vitamin (MULTIVITAMIN WITH MINERALS) TABS Take 1 tablet by mouth daily.  . potassium chloride (K-DUR) 10 MEQ tablet Take 1 tablet (10 mEq total) by mouth daily.  Marland Kitchen. REXULTI 1 MG TABS Take 1 tablet daily by mouth.   . rosuvastatin (CRESTOR) 40 MG tablet Take 40 mg every evening by mouth.  . tamsulosin (FLOMAX) 0.4 MG CAPS capsule Take 2 capsules (0.8 mg total) by mouth daily.  Marland Kitchen. zolpidem (AMBIEN) 10 MG tablet Take 10 mg at bedtime by mouth.   . tiotropium (SPIRIVA) 18 MCG inhalation  capsule Place 1 capsule (18 mcg total) into inhaler and inhale daily.   No facility-administered encounter medications on file as of 04/27/2017.     Allergies  Allergen Reactions  . No Known Allergies     Review of Systems  Constitutional: Positive for appetite change. Negative for activity change.       Poor appetite, however weight is stable today  HENT: Negative for congestion and dental problem.   Eyes: Negative for photophobia and visual disturbance.  Respiratory: Positive for shortness of breath and wheezing. Negative for cough.        Poor exercise tolerance.  Increased SOB  Cardiovascular: Negative for chest pain, palpitations and leg swelling.  Gastrointestinal: Negative for blood in stool, constipation and diarrhea.  Genitourinary: Negative for difficulty urinating and frequency.  Musculoskeletal: Positive for arthralgias, back pain and gait problem.       Take narcotics for chronic pain  Neurological: Negative for dizziness, facial asymmetry and speech difficulty.  Psychiatric/Behavioral: Negative for behavioral problems, confusion, decreased concentration and sleep disturbance. The patient is not nervous/anxious.        Controlled anxiety and depression.  I have concern  regarding his medications and somnolence.  He has little facial expression    BP 136/88 (BP Location: Left Arm, Patient Position: Sitting, Cuff Size: Large)   Pulse 96   Temp (!) 97.4 F (36.3 C) (Temporal)   Resp 20   Ht 5\' 6"  (1.676 m)   Wt 252 lb (114.3 kg)   SpO2 90%   BMI 40.67 kg/m   Physical Exam  Constitutional: He is oriented to person, place, and time. He appears well-developed.  Over nourished, obese.    HENT:  Head: Normocephalic and atraumatic.  Mouth/Throat: Oropharynx is clear and moist.  Eyes: Conjunctivae are normal. Pupils are equal, round, and reactive to light.  Neck: Normal range of motion. No JVD present. No thyromegaly present.  Cardiovascular: Normal rate, regular  rhythm and normal heart sounds.  Pulmonary/Chest: Effort normal. He has decreased breath sounds. He has no rales.  Abdominal: Soft. Bowel sounds are normal. There is no tenderness.  Musculoskeletal: He exhibits edema.  Trace edema.    Neurological: He is alert and oriented to person, place, and time.  Psychiatric: His affect is blunt. His speech is delayed. He is slowed. Cognition and memory are not impaired.  Pleasant affect.  A little more alert today    ASSESSMENT/PLAN:  1. Need for influenza vaccination  - Flu Vaccine QUAD 36+ mos IM - BASIC METABOLIC PANEL WITH GFR  2. Pulmonary emphysema, unspecified emphysema type (HCC) Scheduled to see pulmonary medicine January 18  3. Essential hypertension Controlled.  Question whether he is taking metoprolol  4. Acute renal failure, unspecified acute renal failure type (HCC) During hospitalization.  Will repeat basic metabolic panel with GFR  5.  Alcoholism I suspect he is drinking more than he is telling me.  I advised him to quit  6.  Hospital discharge follow-up -primary reason for visit  Patient Instructions  Continue to drink plenty of water I will repeat the blood test to check kidneys No change in medicines Your BP is good today The only BP medicine should be metoprolol See pulmonary doctor in January as scheduled See me in one month Call sooner for problems    Eustace Moore, MD

## 2017-04-27 NOTE — Patient Instructions (Addendum)
Continue to drink plenty of water I will repeat the blood test to check kidneys No change in medicines Your BP is good today The only BP medicine should be metoprolol See pulmonary doctor in January as scheduled See me in one month Call sooner for problems

## 2017-05-01 ENCOUNTER — Telehealth: Payer: Self-pay | Admitting: Family Medicine

## 2017-05-01 ENCOUNTER — Telehealth: Payer: Self-pay

## 2017-05-01 NOTE — Telephone Encounter (Signed)
Adrian Rivas, physical therapist with [facility], left message on nurse line regarding patient. She states that she received orders for PT for patient following hospital discharge. She states she saw him one time and he refused any further services. She also offered to make him a hospital follow-up with Dr. Delton SeeNelson, which he also refused. She is calling just to inform. Callback # [phone]

## 2017-05-01 NOTE — Telephone Encounter (Signed)
Left message to call office

## 2017-05-01 NOTE — Telephone Encounter (Signed)
-----   Message from Eustace MooreYvonne Sue Nelson, MD sent at 04/29/2017 12:40 PM EST ----- Called him.  His blood work looks great

## 2017-05-01 NOTE — Progress Notes (Signed)
Adrian LundJanet called back, aware of lab results.

## 2017-05-11 DIAGNOSIS — J449 Chronic obstructive pulmonary disease, unspecified: Secondary | ICD-10-CM | POA: Diagnosis not present

## 2017-05-11 DIAGNOSIS — G8929 Other chronic pain: Secondary | ICD-10-CM | POA: Diagnosis not present

## 2017-05-11 DIAGNOSIS — I10 Essential (primary) hypertension: Secondary | ICD-10-CM | POA: Diagnosis not present

## 2017-05-11 DIAGNOSIS — I959 Hypotension, unspecified: Secondary | ICD-10-CM | POA: Diagnosis not present

## 2017-05-22 ENCOUNTER — Encounter: Payer: Self-pay | Admitting: Internal Medicine

## 2017-05-22 ENCOUNTER — Ambulatory Visit (INDEPENDENT_AMBULATORY_CARE_PROVIDER_SITE_OTHER): Payer: Commercial Managed Care - PPO | Admitting: Internal Medicine

## 2017-05-22 ENCOUNTER — Telehealth: Payer: Self-pay | Admitting: Internal Medicine

## 2017-05-22 VITALS — BP 134/74 | HR 93 | Ht 66.0 in | Wt 251.1 lb

## 2017-05-22 DIAGNOSIS — J449 Chronic obstructive pulmonary disease, unspecified: Secondary | ICD-10-CM | POA: Diagnosis not present

## 2017-05-22 DIAGNOSIS — J9611 Chronic respiratory failure with hypoxia: Secondary | ICD-10-CM | POA: Diagnosis not present

## 2017-05-22 MED ORDER — TIOTROPIUM BROMIDE MONOHYDRATE 2.5 MCG/ACT IN AERS: 2.0000 | INHALATION_SPRAY | Freq: Every day | RESPIRATORY_TRACT | 12 refills | Status: AC

## 2017-05-22 MED ORDER — TIOTROPIUM BROMIDE MONOHYDRATE 2.5 MCG/ACT IN AERS: 2.0000 | INHALATION_SPRAY | Freq: Every day | RESPIRATORY_TRACT | 0 refills | Status: DC

## 2017-05-22 NOTE — Assessment & Plan Note (Addendum)
Spirometry 05/22/2017  FEV1 2.30 (72 %)  Ratio 77 with min curvature p am saba and spiriva dpi - 05/22/2017  After extensive coaching inhaler device  effectiveness =    90% with SMI > try spiriva respimat x 2 week sample     When respiratory symptoms begin or become refractory well after a patient reports complete smoking cessation,  Especially when this wasn't the case while they were smoking, a red flag is raised based on the work of Dr Primitivo GauzeFletcher which states:  if you quit smoking when your best day FEV1 is still well preserved it is highly unlikely you will progress to severe disease.  That is to say, once the smoking stops,  the symptoms should not suddenly erupt or markedly worsen.  If so, the differential diagnosis should include  obesity/deconditioning,  LPR/Reflux/Aspiration syndromes,  occult CHF, or  especially side effect of medications commonly used in this population.   I don't see any evidence of chf here and the absence of significant cough/ hoarseness  is against LPR so will start with spiriva change to smi on a trial basis and return for full pfts to eval ? Of ILD posed by CT a and consider HRCT next  Total time devoted to counseling  > 50 % of initial 60 min office visit:  review case with pt/ discussion of options/alternatives/ personally creating written customized instructions  in presence of pt  then going over those specific  Instructions directly with the pt including how to use all of the meds but in particular covering each new medication in detail and the difference between the maintenance= "automatic" meds and the prns using an action plan format for the latter (If this problem/symptom => do that organization reading Left to right).  Please see AVS from this visit for a full list of these instructions which I personally wrote for this pt and  are unique to this visit.

## 2017-05-22 NOTE — Telephone Encounter (Signed)
Patient completed but DID NOT SIGN DPR.

## 2017-05-22 NOTE — Patient Instructions (Addendum)
Try change spiriva 2.5 2 pffs each am (think of it like high octane fuel)  Work on inhaler technique:  relax and gently blow all the way out then take a nice smooth deep breath back in, triggering the inhaler at same time you start breathing in.  Hold for up to 5 seconds if you can. Blow out thru nose. Rinse and gargle with water when done   Only use your albuterol as a rescue medication to be used if you can't catch your breath by resting or doing a relaxed purse lip breathing pattern.  - The less you use it, the better it will work when you need it. - Ok to use up to 2 puffs  every 4 hours if you must but call for immediate appointment if use goes up over your usual need - Don't leave home without it !!  (think of it like the spare tire for your car)      02 should be 2lpm sleeping and walking  But no need at rest  Please schedule a follow up office visit in 6 weeks, call sooner if needed with all medications /inhalers/ solutions in hand so we can verify exactly what you are taking. This includes all medications from all doctors and over the counters with PFTs on return ? Needs hrct next re ? ILD

## 2017-05-22 NOTE — Progress Notes (Signed)
Subjective:     Patient ID: Adrian Rivas, male   DOB: 02-24-1961,    MRN: [id number]  HPI   [age] yowm quit smoking 1998 with sob at wt 200 and gradually worse since then assoc with wt gain referred to pulmonary clinic 05/22/2017 by Dr   Lodema HongSimpson/ Curlene DolphinSue Nelso for sob with ? Copd ? ILD       05/22/2017 [facility] office visit/ Adrian Rivas   Chief Complaint  Patient presents with  . pulmonary consult    shortness of breath, weak, tired, sluggish  no maint inhalers at baseline  then worse x 3 m s cough placed on 2lpm / spiriva and saba but not using any of the meds as rec [id number] = can't walk 100 yards even at a slow pace at a flat grade s stopping due to sob  eg leans on cart [employer] usually not on 02 at all and no assoc cough /congestion even in am - sleeps 2lpm in recliner x 2lpm x years / confused as to how/ when to use inhalers vs 02   No obvious day to day or daytime variability or assoc excess/ purulent sputum or mucus plugs or hemoptysis or cp or chest tightness, subjective wheeze or overt sinus or hb symptoms. No unusual exposure hx or h/o childhood pna/ asthma or knowledge of premature birth.    Also denies any obvious fluctuation of symptoms with weather or environmental changes or other aggravating or alleviating factors except as outlined above   Current Allergies, Complete Past Medical History, Past Surgical History, Family History, and Social History were reviewed in [facility] electronic medical record.  ROS  The following are not active complaints unless bolded Hoarseness, sore throat, dysphagia, dental problems, itching, sneezing,  nasal congestion or discharge of excess mucus or purulent secretions, ear ache,   fever, chills, sweats, unintended wt loss or wt gain, classically pleuritic or exertional cp,  orthopnea pnd or leg swelling, presyncope, palpitations, abdominal pain, anorexia, nausea, vomiting, diarrhea  or change in bowel habits or change in bladder habits,  change in stools or change in urine, dysuria, hematuria,  rash, arthralgias, visual complaints, headache, numbness, weakness or ataxia or problems with walking or coordination,  change in mood/affect or memory.        Current Meds  Medication Sig  . albuterol (PROVENTIL HFA;VENTOLIN HFA) 108 (90 Base) MCG/ACT inhaler Inhale 2 puffs into the lungs every 6 (six) hours as needed for wheezing or shortness of breath.  Marland Kitchen. aspirin EC 81 MG tablet Take 81 mg every evening by mouth.  . cholecalciferol (VITAMIN D) 400 UNITS TABS Take 400 Units by mouth daily.  Marland Kitchen. desvenlafaxine (PRISTIQ) 100 MG 24 hr tablet Take 100 mg by mouth every morning.  Marland Kitchen. HYDROcodone-acetaminophen (NORCO) 10-325 MG tablet Take 1 tablet by mouth every 8 (eight) hours.   Marland Kitchen. ipratropium-albuterol (DUONEB) 0.5-2.5 (3) MG/3ML SOLN Take 3 mLs by nebulization every 4 (four) hours as needed.  . lamoTRIgine (LAMICTAL) 100 MG tablet Take 100-200 mg 2 (two) times daily by mouth. TAKES 200 mg in the morning and 100mg  in the afternoon  . methocarbamol (ROBAXIN) 500 MG tablet Take 1 tablet (500 mg total) by mouth every 6 (six) hours as needed for muscle spasms. (Patient taking differently: Take 500 mg 3 (three) times daily by mouth. )  . metoprolol succinate (TOPROL-XL) 50 MG 24 hr tablet Take 1 tablet (50 mg total) by mouth daily. Take with or immediately following a meal.  .  mometasone (NASONEX) 50 MCG/ACT nasal spray Place 2 sprays into the nose daily as needed.  . Multiple Vitamin (MULTIVITAMIN WITH MINERALS) TABS Take 1 tablet by mouth daily.  . potassium chloride (K-DUR) 10 MEQ tablet Take 1 tablet (10 mEq total) by mouth daily.  Marland Kitchen REXULTI 1 MG TABS Take 1 tablet daily by mouth.   . rosuvastatin (CRESTOR) 40 MG tablet Take 40 mg every evening by mouth.  . tamsulosin (FLOMAX) 0.4 MG CAPS capsule Take 2 capsules (0.8 mg total) by mouth daily.  Marland Kitchen tiotropium (SPIRIVA) 18 MCG inhalation capsule Place 1 capsule (18 mcg total) into inhaler and inhale  daily.  Marland Kitchen zolpidem (AMBIEN) 10 MG tablet Take 10 mg at bedtime by mouth.          Review of Systems     Objective:   Physical Exam    amb obese wm very somber, answers most questions yes and now   Wt Readings from Last 3 Encounters:  05/22/17 251 lb 2 oz (113.9 kg)  04/27/17 252 lb (114.3 kg)  04/17/17 252 lb 3.2 oz (114.4 kg)     Vital signs reviewed - Note on arrival 02 sats  97% on RA  HEENT: nl dentition, turbinates bilaterally, and oropharynx. Nl external ear canals without cough reflex   NECK :  without JVD/Nodes/TM/ nl carotid upstrokes bilaterally   LUNGS: no acc muscle use,  Nl contour chest with distant bs bilaterally    CV:  RRR  no s3 or murmur or increase in P2, and no edema   ABD:  Quite obese but soft  and nontender with limited inspiratory excursion in the supine position. No bruits or organomegaly appreciated, bowel sounds nl  MS:  Nl gait/ ext warm without deformities, calf tenderness, cyanosis or clubbing No obvious joint restrictions   SKIN: warm and dry without lesions    NEURO:  alert, approp, nl sensorium with  no motor or cerebellar deficits apparent.         I personally reviewed images and agree with radiology impression as follows:   Chest CT a  03/10/17 1. No pulmonary emboli. 2. Stable changes of COPD with the exception of progressive interstitial lung disease. 3. No suspicious lung nodules. 4. Diffuse hepatic steatosis. 5. Mild calcified aortic atherosclerosis.   Labs   reviewed:      Chemistry      Component Value Date/Time   NA 140 04/27/2017 1022   K 3.8 04/27/2017 1022   CL 103 04/27/2017 1022   CO2 27 04/27/2017 1022   BUN 8 04/27/2017 1022   CREATININE 0.96 04/27/2017 1022      Component Value Date/Time   CALCIUM 9.6 04/27/2017 1022   ALKPHOS 89 04/18/2017 0739   AST 63 (H) 04/18/2017 0739   ALT 38 04/18/2017 0739   BILITOT 1.1 04/18/2017 0739        Lab Results  Component Value Date   WBC 5.9  04/18/2017   HGB 13.5 04/18/2017   HCT 41.4 04/18/2017   MCV 109.8 (H) 04/18/2017   PLT 166 04/18/2017         Lab Results  Component Value Date   TSH 3.056 03/20/2017     BNP         04/16/17  =  18              Assessment:

## 2017-05-22 NOTE — Assessment & Plan Note (Signed)
Body mass index is 40.53 kg/m.    Lab Results  Component Value Date   TSH 3.056 03/20/2017     Contributing to gerd risk/ doe/reviewed the need and the process to achieve and maintain neg calorie balance > defer f/u primary care including intermittently monitoring thyroid status

## 2017-05-23 DIAGNOSIS — I959 Hypotension, unspecified: Secondary | ICD-10-CM | POA: Diagnosis not present

## 2017-05-23 DIAGNOSIS — G8929 Other chronic pain: Secondary | ICD-10-CM | POA: Diagnosis not present

## 2017-05-23 DIAGNOSIS — I10 Essential (primary) hypertension: Secondary | ICD-10-CM | POA: Diagnosis not present

## 2017-05-23 DIAGNOSIS — J449 Chronic obstructive pulmonary disease, unspecified: Secondary | ICD-10-CM

## 2017-07-03 ENCOUNTER — Ambulatory Visit: Payer: Commercial Managed Care - PPO | Admitting: Internal Medicine

## 2017-07-09 ENCOUNTER — Encounter (HOSPITAL_COMMUNITY): Payer: Self-pay

## 2017-07-09 ENCOUNTER — Emergency Department (HOSPITAL_COMMUNITY)
Admission: EM | Admit: 2017-07-09 | Discharge: 2017-07-09 | Disposition: A | Discharge status: Home / Self Care | Payer: Commercial Managed Care - PPO | Attending: Emergency Medicine | Admitting: Emergency Medicine

## 2017-07-09 ENCOUNTER — Emergency Department (HOSPITAL_COMMUNITY): Payer: Commercial Managed Care - PPO

## 2017-07-09 DIAGNOSIS — J449 Chronic obstructive pulmonary disease, unspecified: Secondary | ICD-10-CM | POA: Insufficient documentation

## 2017-07-09 DIAGNOSIS — Z7982 Long term (current) use of aspirin: Secondary | ICD-10-CM | POA: Insufficient documentation

## 2017-07-09 DIAGNOSIS — F10921 Alcohol use, unspecified with intoxication delirium: Secondary | ICD-10-CM | POA: Diagnosis not present

## 2017-07-09 DIAGNOSIS — Y939 Activity, unspecified: Secondary | ICD-10-CM | POA: Diagnosis not present

## 2017-07-09 DIAGNOSIS — F10120 Alcohol abuse with intoxication, uncomplicated: Secondary | ICD-10-CM | POA: Diagnosis present

## 2017-07-09 DIAGNOSIS — Y998 Other external cause status: Secondary | ICD-10-CM | POA: Insufficient documentation

## 2017-07-09 DIAGNOSIS — M545 Low back pain: Secondary | ICD-10-CM | POA: Insufficient documentation

## 2017-07-09 DIAGNOSIS — E876 Hypokalemia: Secondary | ICD-10-CM | POA: Insufficient documentation

## 2017-07-09 DIAGNOSIS — W19XXXA Unspecified fall, initial encounter: Secondary | ICD-10-CM | POA: Diagnosis not present

## 2017-07-09 DIAGNOSIS — Z79899 Other long term (current) drug therapy: Secondary | ICD-10-CM | POA: Diagnosis not present

## 2017-07-09 DIAGNOSIS — I10 Essential (primary) hypertension: Secondary | ICD-10-CM | POA: Diagnosis not present

## 2017-07-09 DIAGNOSIS — Y92019 Unspecified place in single-family (private) house as the place of occurrence of the external cause: Secondary | ICD-10-CM | POA: Insufficient documentation

## 2017-07-09 DIAGNOSIS — Z87891 Personal history of nicotine dependence: Secondary | ICD-10-CM | POA: Diagnosis not present

## 2017-07-09 DIAGNOSIS — S0993XA Unspecified injury of face, initial encounter: Secondary | ICD-10-CM | POA: Diagnosis not present

## 2017-07-09 LAB — COMPREHENSIVE METABOLIC PANEL
ALBUMIN: 3.8 g/dL (ref 3.5–5.0)
ALT: 51 U/L (ref 17–63)
ANION GAP: 17 — AB (ref 5–15)
AST: 132 U/L — AB (ref 15–41)
Alkaline Phosphatase: 96 U/L (ref 38–126)
BUN: 6 mg/dL (ref 6–20)
CHLORIDE: 101 mmol/L (ref 101–111)
CO2: 28 mmol/L (ref 22–32)
Calcium: 9.7 mg/dL (ref 8.9–10.3)
Creatinine, Ser: 0.94 mg/dL (ref 0.61–1.24)
GFR calc Af Amer: >60 mL/min (ref >60)
GFR calc non Af Amer: >60 mL/min (ref >60)
GLUCOSE: 112 mg/dL — AB (ref 65–99)
POTASSIUM: 2.7 mmol/L — AB (ref 3.5–5.1)
SODIUM: 146 mmol/L — AB (ref 135–145)
TOTAL PROTEIN: 6.9 g/dL (ref 6.5–8.1)
Total Bilirubin: 1.1 mg/dL (ref 0.3–1.2)

## 2017-07-09 LAB — CBC WITH DIFFERENTIAL/PLATELET
BASOS ABS: 0 10*3/uL (ref 0.0–0.1)
BASOS PCT: 0 %
EOS ABS: 0.2 10*3/uL (ref 0.0–0.7)
Eosinophils Relative: 3 %
HCT: 44.4 % (ref 39.0–52.0)
Hemoglobin: 14.3 g/dL (ref 13.0–17.0)
Lymphocytes Relative: 16 %
Lymphs Abs: 1.1 10*3/uL (ref 0.7–4.0)
MCH: 35.2 pg — ABNORMAL HIGH (ref 26.0–34.0)
MCHC: 32.2 g/dL (ref 30.0–36.0)
MCV: 109.4 fL — ABNORMAL HIGH (ref 78.0–100.0)
MONOS PCT: 10 %
Monocytes Absolute: 0.7 10*3/uL (ref 0.1–1.0)
NEUTROS ABS: 5 10*3/uL (ref 1.7–7.7)
Neutrophils Relative %: 71 %
PLATELETS: 159 10*3/uL (ref 150–400)
RBC: 4.06 MIL/uL — ABNORMAL LOW (ref 4.22–5.81)
RDW: 13.6 % (ref 11.5–15.5)
WBC: 7 10*3/uL (ref 4.0–10.5)

## 2017-07-09 LAB — CK: Total CK: 552 U/L — ABNORMAL HIGH (ref 49–397)

## 2017-07-09 LAB — ETHANOL: ALCOHOL ETHYL (B): 343 mg/dL — AB (ref <10)

## 2017-07-09 MED ORDER — POTASSIUM CHLORIDE 10 MEQ/100ML IV SOLN
10.0000 meq | INTRAVENOUS | Status: DC
  Administered 2017-07-09 (×2): 10 meq via INTRAVENOUS
  Filled 2017-07-09 (×3): qty 100

## 2017-07-09 MED ORDER — SODIUM CHLORIDE 0.9 % IV BOLUS (SEPSIS)
1000.0000 mL | Freq: Once | INTRAVENOUS | Status: AC
  Administered 2017-07-09: 1000 mL via INTRAVENOUS

## 2017-07-09 MED ORDER — POTASSIUM CHLORIDE CRYS ER 20 MEQ PO TBCR
40.0000 meq | EXTENDED_RELEASE_TABLET | Freq: Once | ORAL | Status: AC
  Administered 2017-07-09: 40 meq via ORAL
  Filled 2017-07-09: qty 2

## 2017-07-09 NOTE — ED Notes (Signed)
Date and time results received: 07/09/17 6:06 PM  (use smartphrase ".now" to insert current time)  Test: k+ Critical Value: 2.7  Name of Provider Notified: Jeraldine LootsLockwood  Orders Received? Or Actions Taken?:

## 2017-07-09 NOTE — ED Notes (Signed)
Date and time results received: 07/09/17 6:01 PM  (use smartphrase ".now" to insert current time)  Test: etoh Critical Value: 343  Name of Provider Notified: Jeraldine LootsLockwood  Orders Received? Or Actions Taken?:

## 2017-07-09 NOTE — Discharge Instructions (Signed)
As discussed, it is important to monitor your condition carefully, and be sure to follow-up with your physician within 1 week for repeat blood test to ensure that your potassium level is normal.  Equally important that you continue to work on your alcohol use. Please use the provided resources if you need additional assistance with this.  Return here for concerning changes in your condition.

## 2017-07-09 NOTE — ED Triage Notes (Signed)
Ems reports pts son called ems because he found pt on floor and was unsure if pt was breathing.  Reports [facility] and pt was not on his oxygen.  Son saw pt 3 hours before he found him so unsure how long pt had been laying in the floor. Pt alert but drowsy>  Reports pt drinks heavily and takes hydrocodone. Pt says he was not trying to kill himself.  Pt has abrasion to forehead and nose.  Pt can answer questions.  02 sat on 6liters 84%.

## 2017-07-09 NOTE — ED Provider Notes (Addendum)
[facility]GENCY DEPARTMENT Provider Note   CSN: [id number] Arrival date & time: 07/09/17  1609     History   Chief Complaint Chief Complaint  Patient presents with  . Fall  . Alcohol Intoxication    HPI Adrian Rivas is a 57 y.o. male.  HPI  Presents from home after being found by a family member on the floor.  Patient himself is listless, but answers questions after a delay, briefly, seemingly appropriately. Per report the patient was found by his son, after as many as 3 hours on the ground, apparently after a fall.  The patient states that he is unsure of falling, does not recall a specific event, he does complain of pain in his lower back, and nose. He denies confusion, disorientation. He denies medical problems, acknowledges using drugs and alcohol.  Specifically, the patient does use hydrocodone. 5 caveat secondary to mental status Patient was reportedly hypoxic on room air, and only improved to 84% with 6 L nasal cannula provided by EMS.       Past Medical History:  Diagnosis Date  . Acute renal failure (ARF) (HCC) 04/17/2017  . Anxiety   . Arthritis    spine  . COPD (chronic obstructive pulmonary disease) (HCC)    TOLD HE HAS COPD - QUIT SMOKING 15 YRS AGO-- NO C/O OF SOB  . Depression   . Dyslipidemia   . HTN (hypertension)   . Hyperlipidemia   . Pain    CHRONIC NECK PAIN- HX OF ANTERIOR FUSION  . Pain    LOWER BACK WITH PAIN DOWN BOTH LEGS - BUT WORSE LEFT LEG WITH NUMBNESS OUTER SIDE OF LEFT LEG - HAS SPINAL STENOSIS  . Substance abuse (HCC)    quit drinking 15 + years ago    Patient Active Problem List   Diagnosis Date Noted  . Chronic respiratory failure with hypoxia (HCC) 05/22/2017  . Morbid obesity due to excess calories (HCC) 05/22/2017  . Alcoholism, chronic (HCC) 04/27/2017  . Hypotension 04/17/2017  . Lactic acidosis 04/17/2017  . Acute renal failure (ARF) (HCC) 04/17/2017  . Substance abuse in remission (HCC) 09/12/2016  .  Morbid obesity (HCC)   . Primary insomnia   . Benign prostatic hyperplasia without lower urinary tract symptoms   . Hyperlipidemia   . Abnormality of gait   . COPD mixed type (HCC)   . HTN (hypertension)   . Spinal stenosis of lumbar region 10/20/2012    Past Surgical History:  Procedure Laterality Date  . "SMALL BODIES REMOVED" FROM BOTH SHOULDERS    . BACK SURGERY     LUMBAR LAMINECTOMY  . BILATERAL CARPAL TUNNEL RELEASE    . CERVICAL FUSION    . FASCIOTOMY Left 02/19/2016   Procedure: ANTERIOR COMPARTMENT FASCIOTOMY;  Surgeon: [name], MD;  [facility];  Service: Orthopedics;  Laterality: Left;  . FRACTURE SURGERY    . LUMBAR LAMINECTOMY/DECOMPRESSION MICRODISCECTOMY N/A 10/20/2012   Procedure: DECOMPRESSION L2-L3, L1-L2;  Surgeon: [name], MD;  Location: [facility] ORS;  Service: Orthopedics;  Laterality: N/A;  . ORIF TIBIA PLATEAU Left 02/19/2016  . ORIF TIBIA PLATEAU Left 02/19/2016   Procedure: OPEN REDUCTION INTERNAL FIXATION (ORIF) TIBIAL PLATEAU;  Surgeon: [name], MD;  [facility];  Service: Orthopedics;  Laterality: Left;  . RIGHT ACL RECONSTRUCTION    . SPINE SURGERY         Home Medications    Prior to Admission medications   Medication Sig Start Date End Date  Taking? Authorizing Provider  albuterol (PROVENTIL HFA;VENTOLIN HFA) 108 (90 Base) MCG/ACT inhaler Inhale 2 puffs into the lungs every 6 (six) hours as needed for wheezing or shortness of breath. 01/15/17   Eustace Moore, MD  aspirin EC 81 MG tablet Take 81 mg every evening by mouth.    [provider]  cholecalciferol (VITAMIN D) 400 UNITS TABS Take 400 Units by mouth daily.    [provider]  desvenlafaxine (PRISTIQ) 100 MG 24 hr tablet Take 100 mg by mouth every morning.    [provider]  HYDROcodone-acetaminophen (NORCO) 10-325 MG tablet Take 1 tablet by mouth every 8 (eight) hours.  08/08/16   [provider]  ipratropium-albuterol (DUONEB)  0.5-2.5 (3) MG/3ML SOLN Take 3 mLs by nebulization every 4 (four) hours as needed. 04/01/17   Eustace Moore, MD  lamoTRIgine (LAMICTAL) 100 MG tablet Take 100-200 mg 2 (two) times daily by mouth. TAKES 200 mg in the morning and 100mg  in the afternoon    [provider]  methocarbamol (ROBAXIN) 500 MG tablet Take 1 tablet (500 mg total) by mouth every 6 (six) hours as needed for muscle spasms. Patient taking differently: Take 500 mg 3 (three) times daily by mouth.  02/29/16   Angiulli, Mcarthur Rossetti, PA-C  metoprolol succinate (TOPROL-XL) 50 MG 24 hr tablet Take 1 tablet (50 mg total) by mouth daily. Take with or immediately following a meal. 09/23/16   Eustace Moore, MD  mometasone (NASONEX) 50 MCG/ACT nasal spray Place 2 sprays into the nose daily as needed. 08/28/16   Eustace Moore, MD  Multiple Vitamin (MULTIVITAMIN WITH MINERALS) TABS Take 1 tablet by mouth daily.    [provider]  potassium chloride (K-DUR) 10 MEQ tablet Take 1 tablet (10 mEq total) by mouth daily. 04/02/17   Eustace Moore, MD  REXULTI 1 MG TABS Take 1 tablet daily by mouth.  01/16/16   [provider]  rosuvastatin (CRESTOR) 40 MG tablet Take 40 mg every evening by mouth.    [provider]  tamsulosin (FLOMAX) 0.4 MG CAPS capsule Take 2 capsules (0.8 mg total) by mouth daily. 04/16/17   Eustace Moore, MD  Tiotropium Bromide Monohydrate (SPIRIVA RESPIMAT) 2.5 MCG/ACT AERS Inhale 2 puffs into the lungs daily. 05/22/17   Nyoka Cowden, MD  zolpidem (AMBIEN) 10 MG tablet Take 10 mg at bedtime by mouth.     [provider]    Family History Family History  Problem Relation Age of Onset  . Clotting disorder Mother   . COPD Mother   . Hypertension Mother   . Early death Mother 37       fracture - blood clot  . Kidney disease Father   . COPD Father   . Stroke Father     Social History Social History   Tobacco Use  . Smoking status: Former Smoker     Packs/day: 1.00    Years: 20.00    Pack years: 20.00    Types: Cigarettes    Last attempt to quit: 05/05/1996    Years since quitting: 21.1  . Smokeless tobacco: Never Used  . Tobacco comment: QUIT SMOKING ABOUT 15 YRS AGO  Substance Use Topics  . Alcohol use: Yes    Comment: heavily  . Drug use: No     Allergies   No known allergies   Review of Systems Review of Systems  Unable to perform ROS: Mental status change  Physical Exam Updated Vital Signs BP 100/60 (BP Location: Left Arm)   Pulse 82   Temp 98.1 F (36.7 C) (Oral)   Resp 19   Wt 113.4 kg (250 lb)   SpO2 93%   BMI 40.35 kg/m   Physical Exam  Constitutional: He is oriented to person, place, and time. He appears well-developed.  Somnolent obese male who awakens briefly, easily  HENT:  Head: Normocephalic.    Eyes: Conjunctivae and EOM are normal.  Cardiovascular: Normal rate and regular rhythm.  Pulmonary/Chest: Effort normal. No stridor. No respiratory distress.  Abdominal: He exhibits no distension.  Musculoskeletal: He exhibits no edema.  Neurological: He is alert and oriented to person, place, and time. He displays atrophy. He displays no tremor. He exhibits normal muscle tone. He displays no seizure activity.  She is slow to move any extremity, but does move all extremities spontaneously, no facial asymmetry, speech is delayed, but clear.  Skin: Skin is warm and dry.  Psychiatric: He has a normal mood and affect.  Nursing note and vitals reviewed.    ED Treatments / Results  Labs (all labs ordered are listed, but only abnormal results are displayed) Labs Reviewed  ETHANOL - Abnormal; Notable for the following components:      Result Value   Alcohol, Ethyl (B) 343 (*)    All other components within normal limits  CBC WITH DIFFERENTIAL/PLATELET - Abnormal; Notable for the following components:   RBC 4.06 (*)    MCV 109.4 (*)    MCH 35.2 (*)    All other components within normal limits    COMPREHENSIVE METABOLIC PANEL - Abnormal; Notable for the following components:   Sodium 146 (*)    Potassium 2.7 (*)    Glucose, Bld 112 (*)    AST 132 (*)    Anion gap 17 (*)    All other components within normal limits  CK - Abnormal; Notable for the following components:   Total CK 552 (*)    All other components within normal limits    EKG  EKG Interpretation  Date/Time:  Thursday July 09 2017 16:14:10 EST Ventricular Rate:  87 PR Interval:    QRS Duration: 162 QT Interval:  383 QTC Calculation: 461 R Axis:   66 Text Interpretation:  likely sinus rhythm, though with substantial artefact Artifact Abnormal ekg Confirmed by Gerhard Munch [phone]) on 07/09/2017 4:49:54 PM       Radiology Dg Lumbar Spine Complete  Result Date: 07/09/2017 CLINICAL DATA:  Acute low back pain following fall today. Initial encounter. EXAM: LUMBAR SPINE - COMPLETE 4+ VIEW COMPARISON:  10/13/2012 radiographs FINDINGS: Irregularity of the LOWER sacrum/UPPER coccyx on the LATERAL view appears new since 2014 and question acute fracture. Correlate with pain. Moderate to severe multilevel degenerative disc disease, spondylosis and facet arthropathy appears slightly increased since 2014. No definite focal bony lesions or spondylolysis noted. IMPRESSION: Irregularity of the LOWER sacrum/UPPER coccyx appears new since 2014. Question acute fracture-correlate with pain. Moderate to severe multilevel degenerative changes throughout the lumbar spine. Electronically Signed   By: Harmon Pier M.D.   On: 07/09/2017 17:31   Ct Cervical Spine Wo Contrast  Result Date: 07/09/2017 CLINICAL DATA:  57 y/o  M; found on floor with abrasion to forehead. EXAM: CT MAXILLOFACIAL WITHOUT CONTRAST CT CERVICAL SPINE WITHOUT CONTRAST TECHNIQUE: Multidetector CT imaging of the maxillofacial structures was performed. Multiplanar CT image reconstructions were also generated. A small metallic BB was placed on the right temple in  order to  reliably differentiate right from left. Multidetector CT imaging of the cervical spine was performed without intravenous contrast. Multiplanar CT image reconstructions were also generated. COMPARISON:  03/20/2017 CT head FINDINGS: CT MAXILLOFACIAL FINDINGS Osseous: No fracture or mandibular dislocation. No destructive process. Orbits: Negative. No traumatic or inflammatory finding. Sinuses: Mild right maxillary sinus mucosal thickening. Otherwise negative. Soft tissues: Negative. Limited intracranial: No significant or unexpected finding. CT CERVICAL FINDINGS Alignment: Normal. Skull base and vertebrae: No acute fracture. No primary bone lesion or focal pathologic process. C3-4 and C6-7 anterior cervical discectomy and interbody fusion. No apparent hardware related complication. Soft tissues and spinal canal: No prevertebral fluid or swelling. No visible canal hematoma. Disc levels: Mild adjacent segment disease is present at C2-3 and C4-C6 with loss of disc space height and endplate marginal osteophytes. No high-grade bony foraminal or canal stenosis. Upper chest: Negative. Other: Mild calcific atherosclerosis of carotid siphons. IMPRESSION: 1. No acute facial fracture or mandibular dislocation. 2. No acute fracture or dislocation of cervical spine. 3. C3-4 and C6-7 anterior cervical fusion without apparent hardware related complication. Mild cervical spondylosis. Electronically Signed   By: Mitzi Hansen M.D.   On: 07/09/2017 18:49   Ct Maxillofacial Wo Cm  Result Date: 07/09/2017 CLINICAL DATA:  57 y/o  M; found on floor with abrasion to forehead. EXAM: CT MAXILLOFACIAL WITHOUT CONTRAST CT CERVICAL SPINE WITHOUT CONTRAST TECHNIQUE: Multidetector CT imaging of the maxillofacial structures was performed. Multiplanar CT image reconstructions were also generated. A small metallic BB was placed on the right temple in order to reliably differentiate right from left. Multidetector CT imaging of the  cervical spine was performed without intravenous contrast. Multiplanar CT image reconstructions were also generated. COMPARISON:  03/20/2017 CT head FINDINGS: CT MAXILLOFACIAL FINDINGS Osseous: No fracture or mandibular dislocation. No destructive process. Orbits: Negative. No traumatic or inflammatory finding. Sinuses: Mild right maxillary sinus mucosal thickening. Otherwise negative. Soft tissues: Negative. Limited intracranial: No significant or unexpected finding. CT CERVICAL FINDINGS Alignment: Normal. Skull base and vertebrae: No acute fracture. No primary bone lesion or focal pathologic process. C3-4 and C6-7 anterior cervical discectomy and interbody fusion. No apparent hardware related complication. Soft tissues and spinal canal: No prevertebral fluid or swelling. No visible canal hematoma. Disc levels: Mild adjacent segment disease is present at C2-3 and C4-C6 with loss of disc space height and endplate marginal osteophytes. No high-grade bony foraminal or canal stenosis. Upper chest: Negative. Other: Mild calcific atherosclerosis of carotid siphons. IMPRESSION: 1. No acute facial fracture or mandibular dislocation. 2. No acute fracture or dislocation of cervical spine. 3. C3-4 and C6-7 anterior cervical fusion without apparent hardware related complication. Mild cervical spondylosis. Electronically Signed   By: Mitzi Hansen M.D.   On: 07/09/2017 18:49    Procedures Procedures (including critical care time)  Medications Ordered in ED Medications  potassium chloride 10 mEq in 100 mL IVPB (10 mEq Intravenous New Bag/Given 07/09/17 2001)  potassium chloride SA (K-DUR,KLOR-CON) CR tablet 40 mEq (not administered)  sodium chloride 0.9 % bolus 1,000 mL (0 mLs Intravenous Stopped 07/09/17 1800)  potassium chloride SA (K-DUR,KLOR-CON) CR tablet 40 mEq (40 mEq Oral Given 07/09/17 1837)     Initial Impression / Assessment and Plan / ED Course  I have reviewed the triage vital signs and the  nursing notes.  Pertinent labs & imaging results that were available during my care of the patient were reviewed by me and considered in my medical decision making (see chart for details).  Update:, Patient now much more awake, and accompanied by his wife. We discussed all findings at length, including elevated alcohol level, and CT scans reassuring with no fracture. They note that the patient has a long history of alcohol addiction, and has become progressively more sedentary recently. Patient also is on oxygen 24/7, with ongoing therapy/evaluation with pulmonology.  Labs all discussed with the patient and his wife, including hypokalemia. They note that the patient has had this in the past, has previously been provided supplement. Discussed options for repletion, and the patient has been receiving IV repletion, as well as oral here, while recovering from his acute intoxication. We discussed the importance of continuing to stay well-hydrated, drink alcohol responsibly, and monitor his potassium level as an outpatient with outpatient follow-up within 1 week for repeat lab draw. Given the patient's reassuring CT scan, absence of neurologic dysfunction, and improvement here, as he was closer to sobriety, and his potassium repletion, who will be appropriate for discharge once repletion is completed.  9:34 PM Patient awake and alert. He requests discharge. We agreed that the patient will have remaining potassium supplementation provided by tablet form. We again discussed findings, importance of drinking alcohol only in moderation, and monitoring his condition. Patient discharged with his wife.  Final Clinical Impressions(s) / ED Diagnoses  Fall, initial encounter Facial trauma Hypokalemia Acute alcohol intoxication   Gerhard Munch, MD 07/09/17 2100    Gerhard Munch, MD 07/09/17 2135

## 2017-07-13 ENCOUNTER — Encounter: Payer: Self-pay | Admitting: Family Medicine

## 2017-08-03 DEATH — deceased

## 2018-05-27 IMAGING — CT CT ANGIO CHEST
2 of 6 series · 18 of 46 positions shown · IV contrast (iopamidol)
Comparison: Portable chest obtained earlier today. Chest CT dated
04/24/2008, 10/19/2007 and 04/08/2007.

CLINICAL DATA: Shortness of breath and hypoxia. Increased weakness.
Altered mental status for the past 2 months.

EXAM:
CT ANGIOGRAPHY CHEST WITH CONTRAST
TECHNIQUE: Multidetector CT imaging of the chest was performed using the
standard protocol during bolus administration of intravenous
contrast. Multiplanar CT image reconstructions and MIPs were
obtained to evaluate the vascular anatomy.
CONTRAST:  100mL T8T1NY-GKR IOPAMIDOL (T8T1NY-GKR) INJECTION 76%

[Series 6: thins · axial · 0.78mm/px · z∈[+1178,+1424]mm · 15 of 270 slices shown]
[im 12/270  lung]
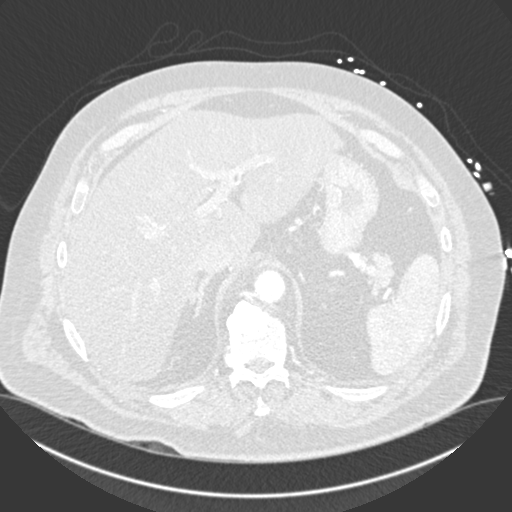
[im 36/270  soft-tissue]
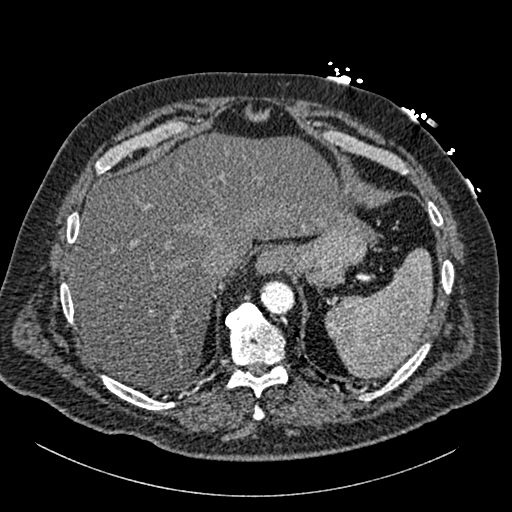
[im 47/270  lung]
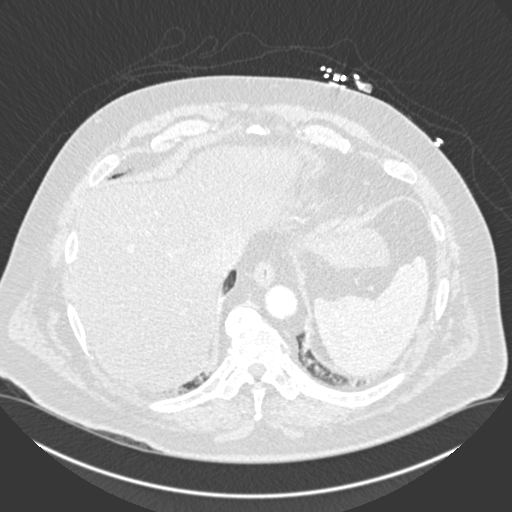
[im 71/270  soft-tissue]
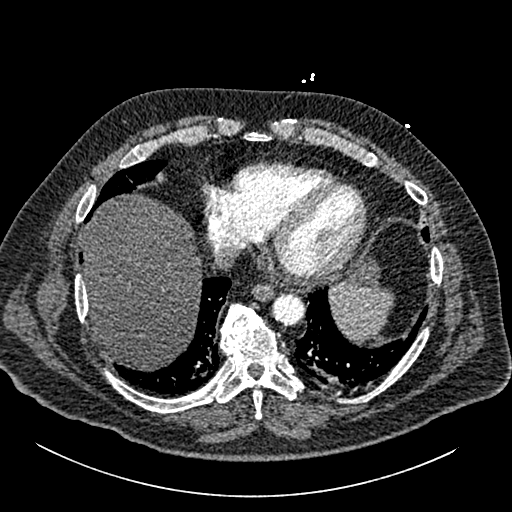
[im 82/270  lung]
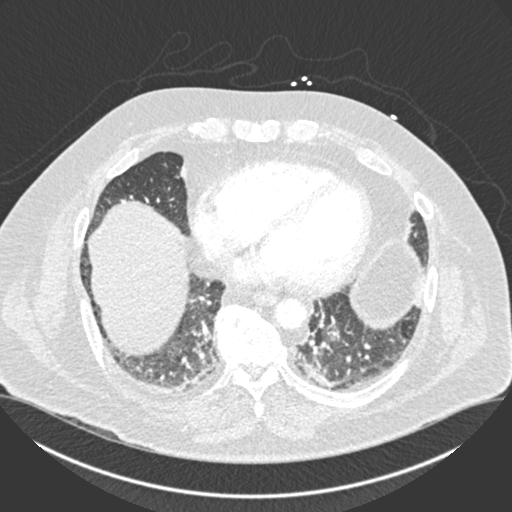
[im 106/270  soft-tissue]
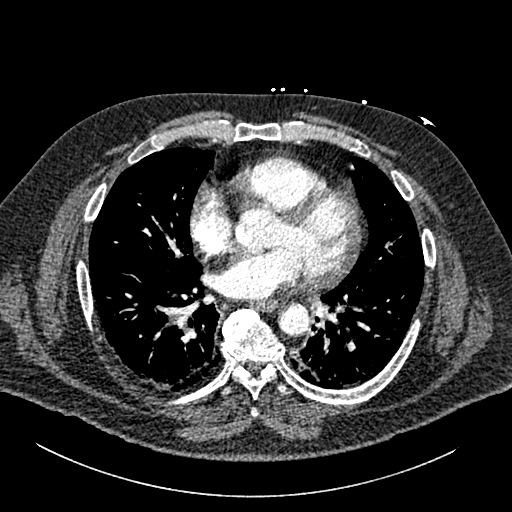
[im 117/270  lung]
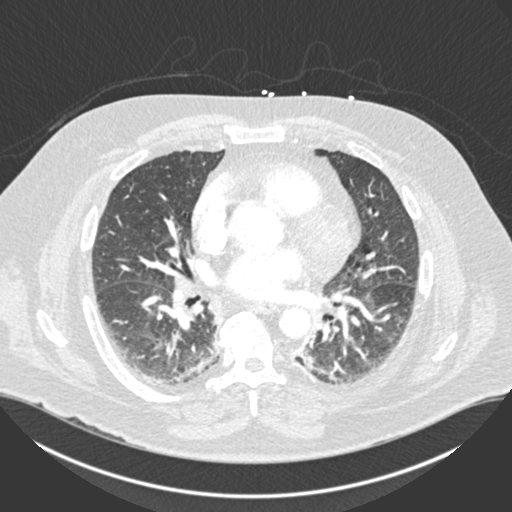
[im 141/270  soft-tissue]
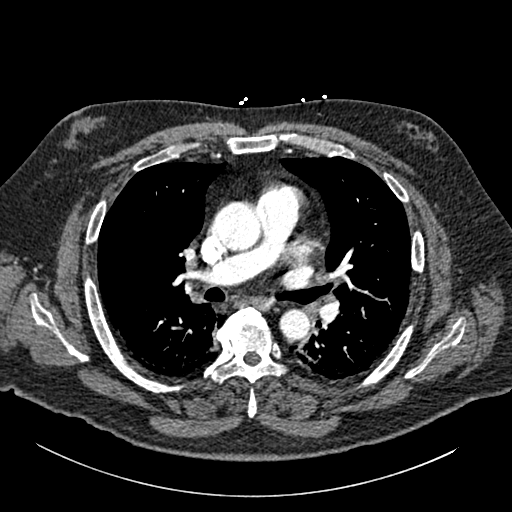
[im 153/270  lung]
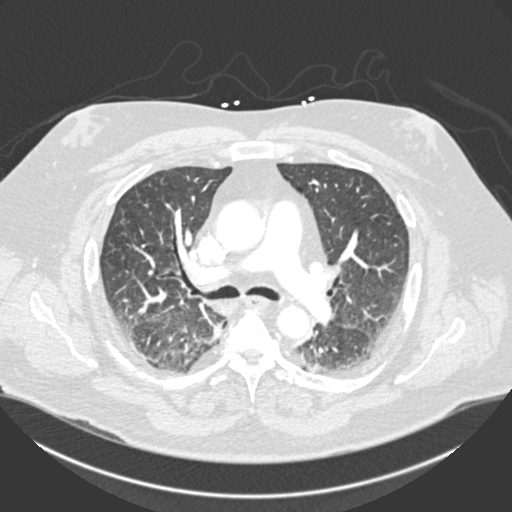
[im 164/270  soft-tissue]
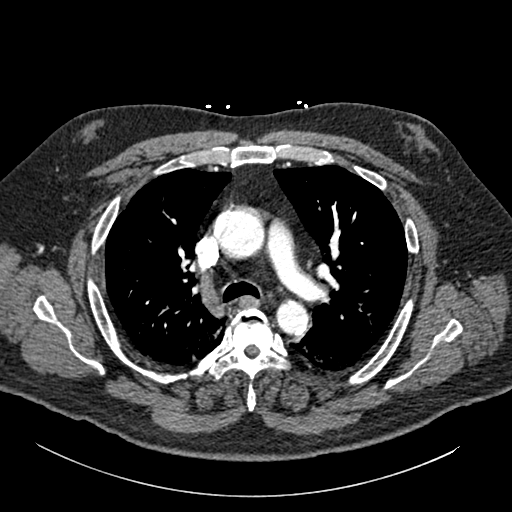
[im 188/270  lung]
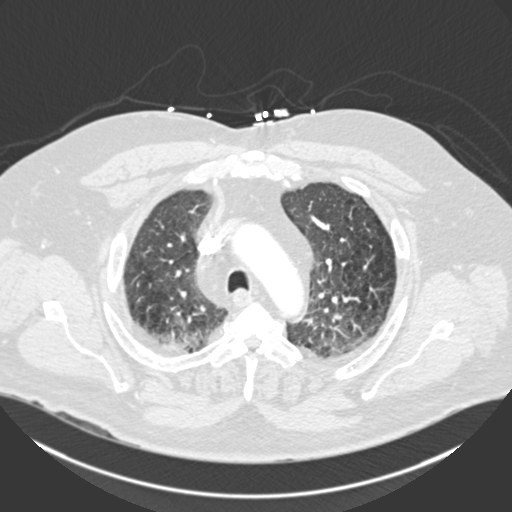
[im 199/270  soft-tissue]
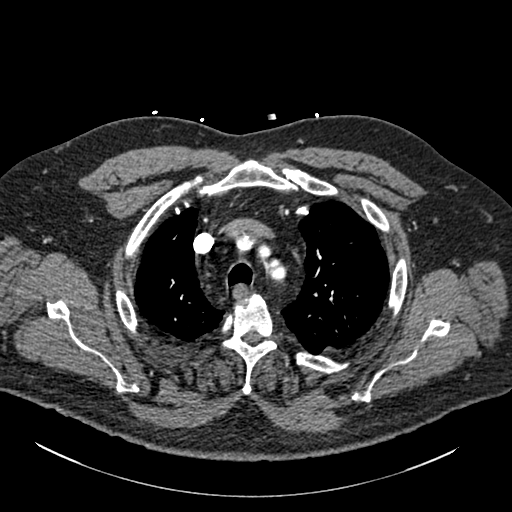
[im 223/270  lung]
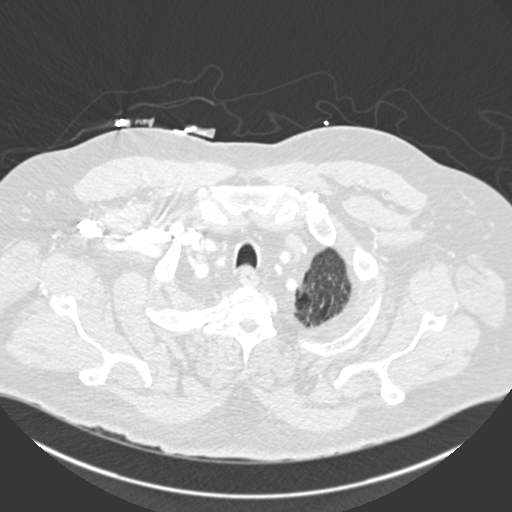
[im 234/270  soft-tissue]
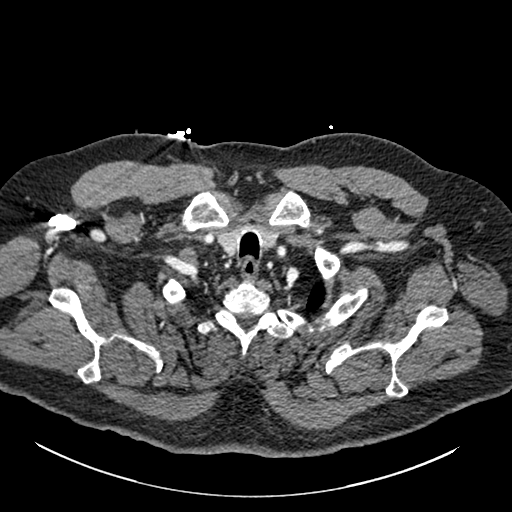
[im 258/270  lung]
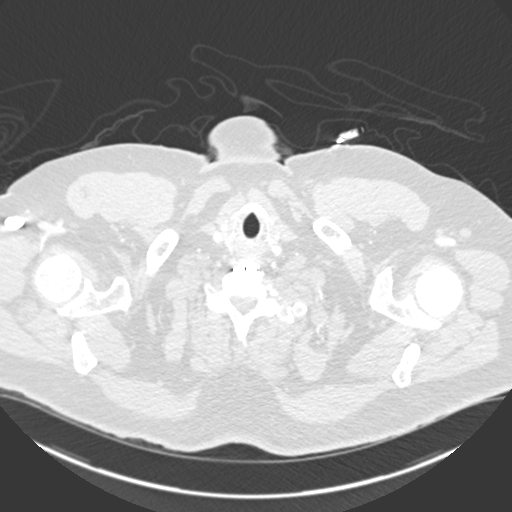

[Series 8: coronal mpr · coronal · 0.56mm/px · 3 of 159 slices shown]
[im 40/159  soft-tissue]
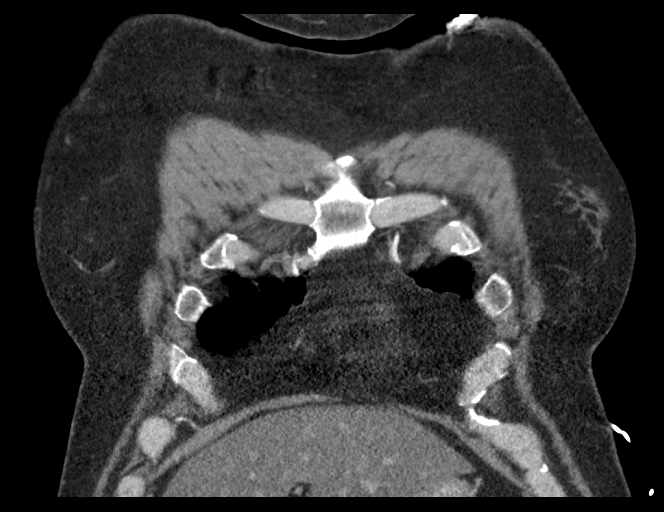
[im 80/159  soft-tissue]
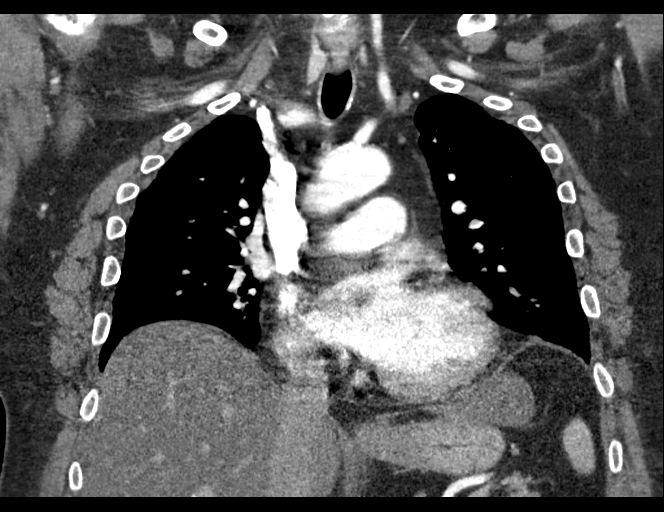
[im 119/159  soft-tissue]
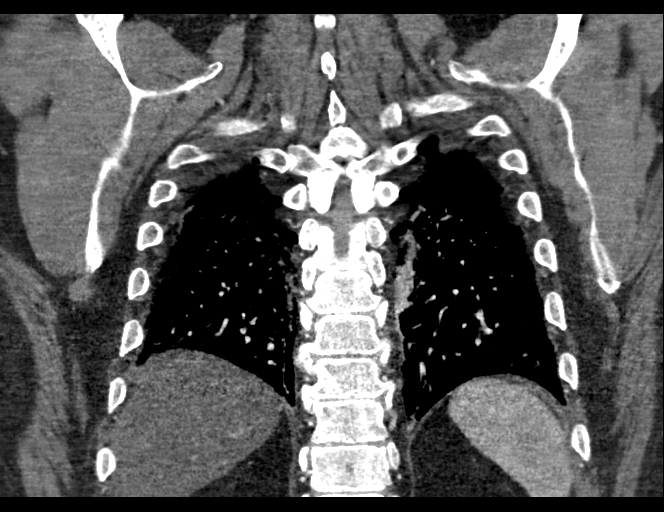

[18 of 46 positions shown; findings below may reference images not displayed]

FINDINGS: Cardiovascular: Normally opacified pulmonary arteries with no
pulmonary arterial filling defects. Atheromatous arterial
calcifications, including the thoracic aorta.

Mediastinum/Nodes: No enlarged mediastinal, hilar, or axillary lymph
nodes. Thyroid gland, trachea, and esophagus demonstrate no
significant findings.

Lungs/Pleura: Diffuse increase in prominence of the interstitial
markings since 04/24/2008. Mild bilateral dependent atelectasis. No
pleural fluid. 4 mm left lower lobe nodule on image number 98 series
7 without significant change since 04/08/2007. A previously
demonstrated right lower lobe nodule is not currently visualized and
a previously demonstrated tiny right middle lobe nodule is also not
currently visualized. No new nodules are seen. Mild bilateral
bullous changes without significant change.

Upper Abdomen: Diffuse low density of the liver relative to the
spleen, with progression.

Musculoskeletal: Thoracic spine degenerative changes with changes of
DISH. Cervical spine fixation hardware.

Review of the MIP images confirms the above findings.
IMPRESSION: 1. No pulmonary emboli.
2. Stable changes of COPD with the exception of progressive
interstitial lung disease.
3. No suspicious lung nodules.
4. Diffuse hepatic steatosis.
5. Mild calcified aortic atherosclerosis.

Aortic Atherosclerosis (A7GAQ-CTT.T) and Emphysema (A7GAQ-9N8.0).

## 2018-05-27 IMAGING — CT CT HEAD W/O CM
3 series · 16 of 47 positions shown, 19 images · non-contrast
Comparison: None.

CLINICAL DATA: Increased weakness for the past 2 months. Altered
mental status. Hypotension.

EXAM:
CT HEAD WITHOUT CONTRAST
TECHNIQUE: Contiguous axial images were obtained from the base of the skull
through the vertex without intravenous contrast.

[Series 2: head wo · axial · 0.46mm/px · z∈[-10,+115]mm · 10 of 31 slices shown, 13 images]
[im 3/31  brain]
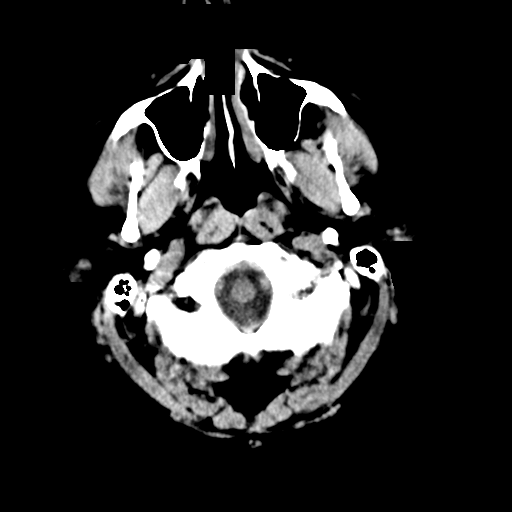
[im 3/31  bone]
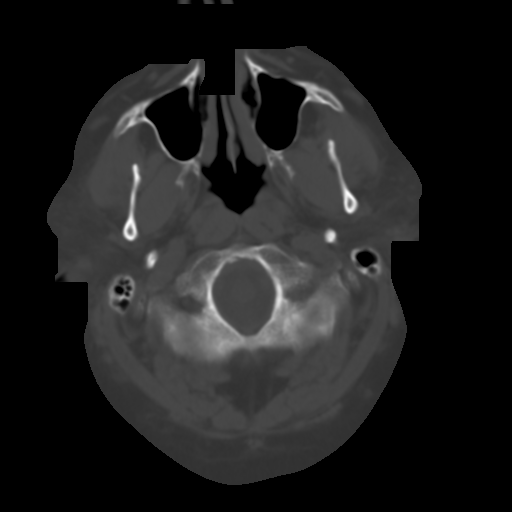
[im 6/31  brain]
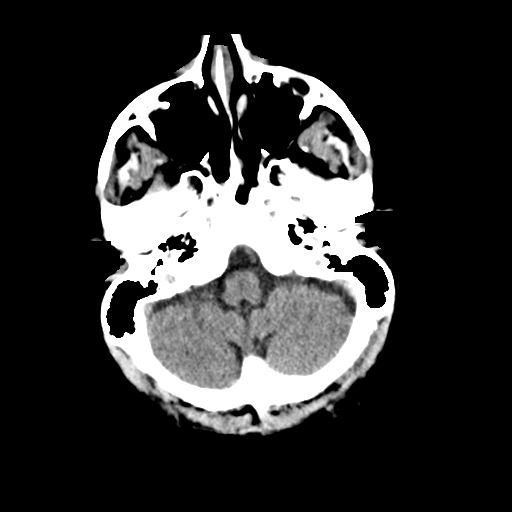
[im 9/31  brain]
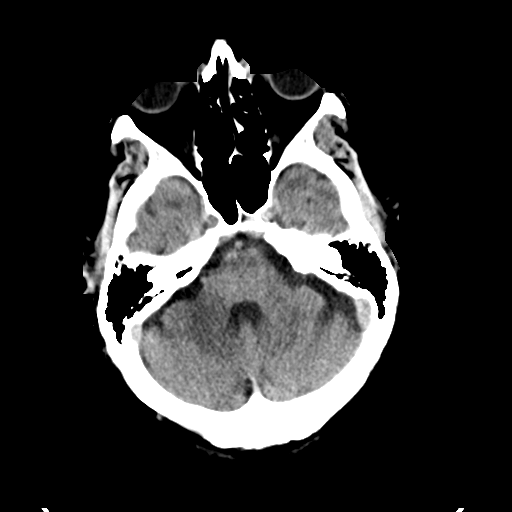
[im 11/31  brain]
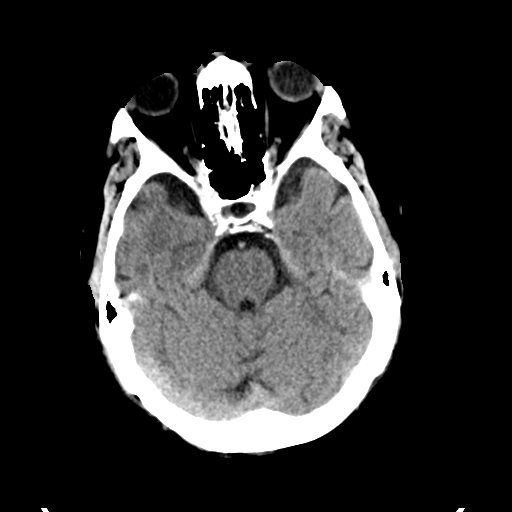
[im 14/31  brain]
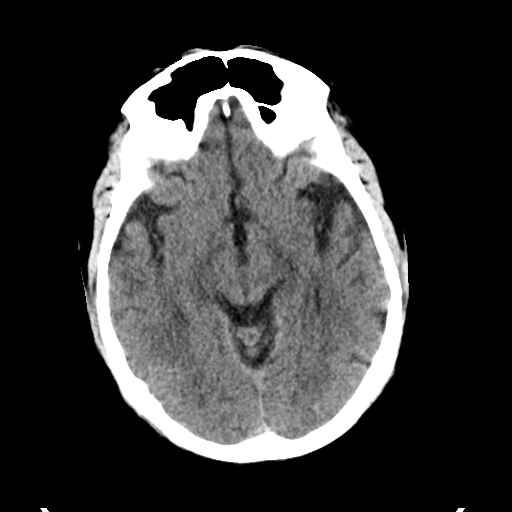
[im 14/31  bone]
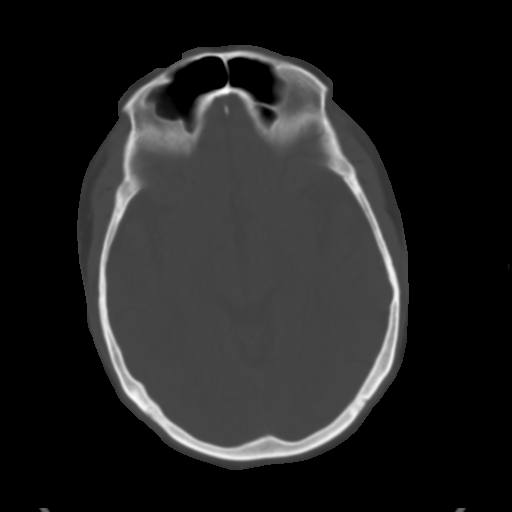
[im 17/31  brain]
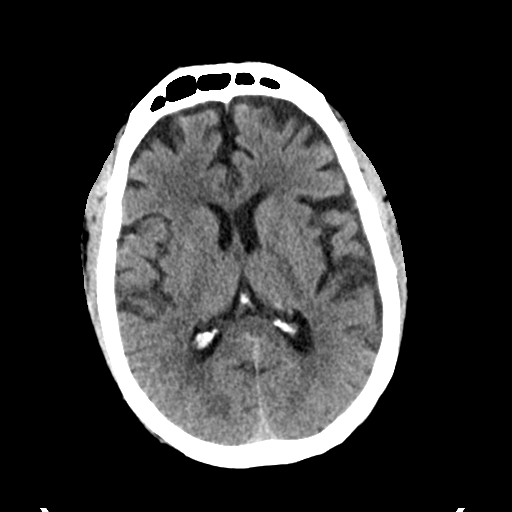
[im 20/31  brain]
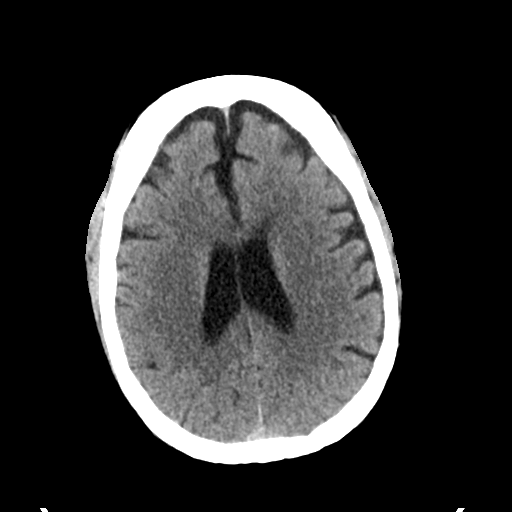
[im 23/31  brain]
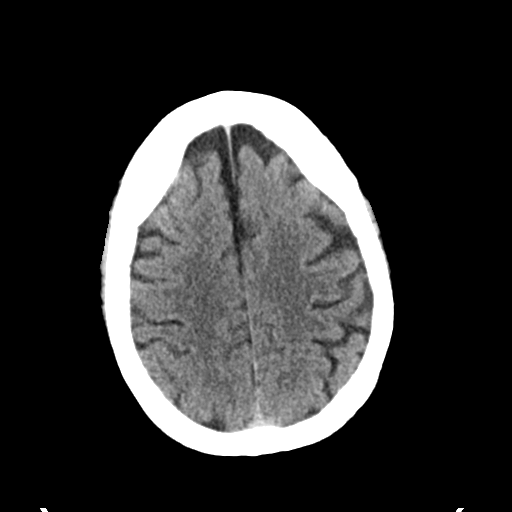
[im 25/31  brain]
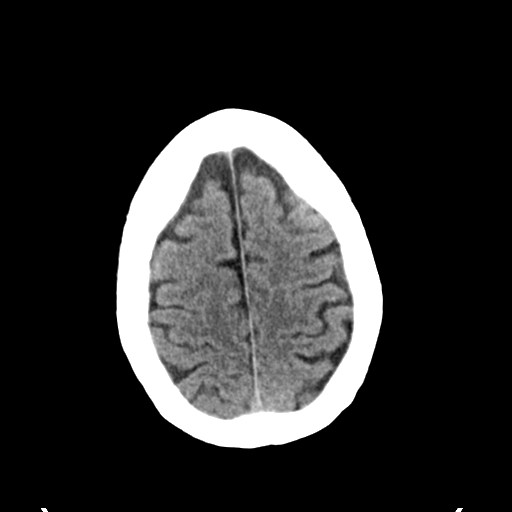
[im 25/31  bone]
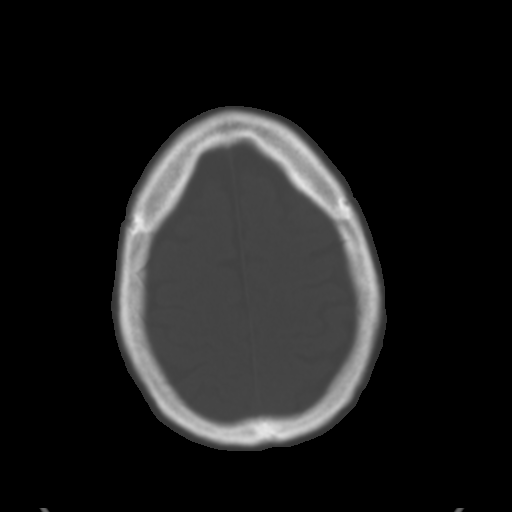
[im 28/31  brain]
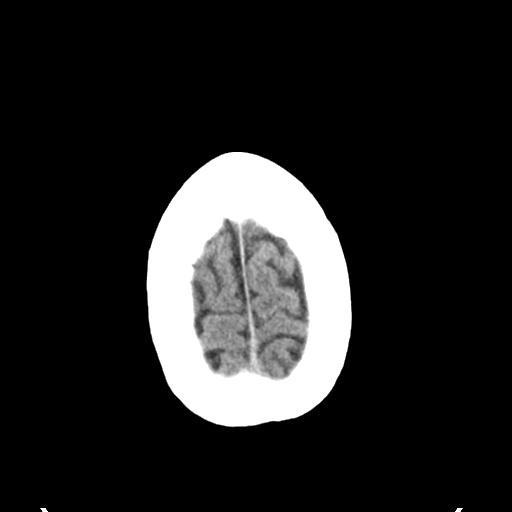

[Series 4: coronal soft tissue · coronal · 0.35mm/px · 3 of 73 slices shown]
[im 25/73  brain]
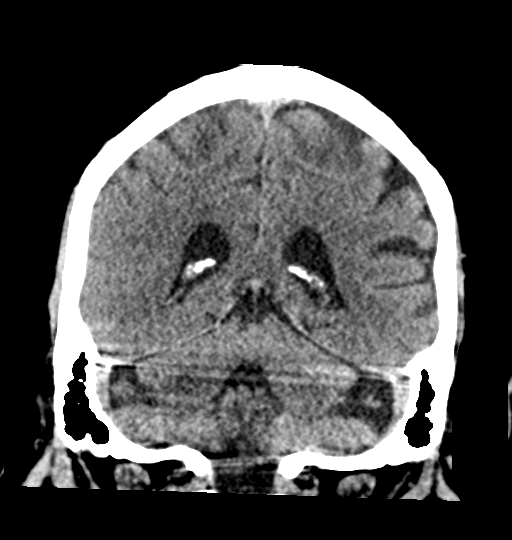
[im 33/73  brain]
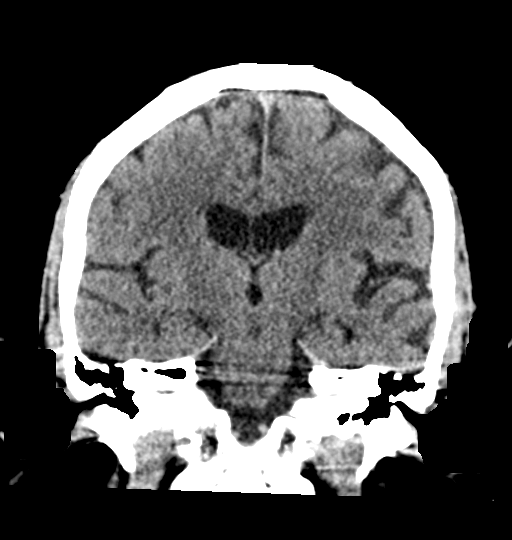
[im 41/73  brain]
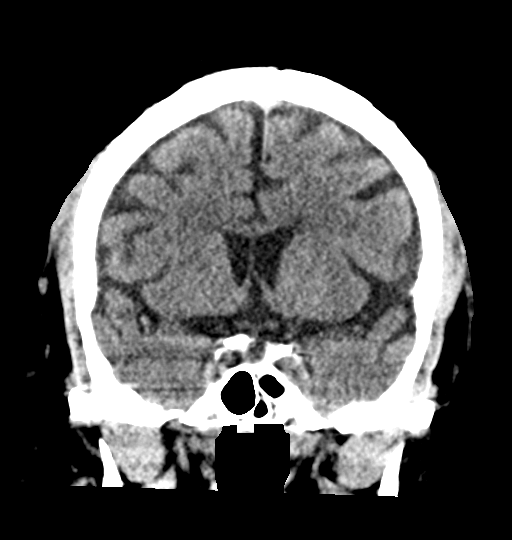

[Series 5: sagittal soft tissue · sagittal · 0.37mm/px · 3 of 61 slices shown]
[im 21/61  brain]
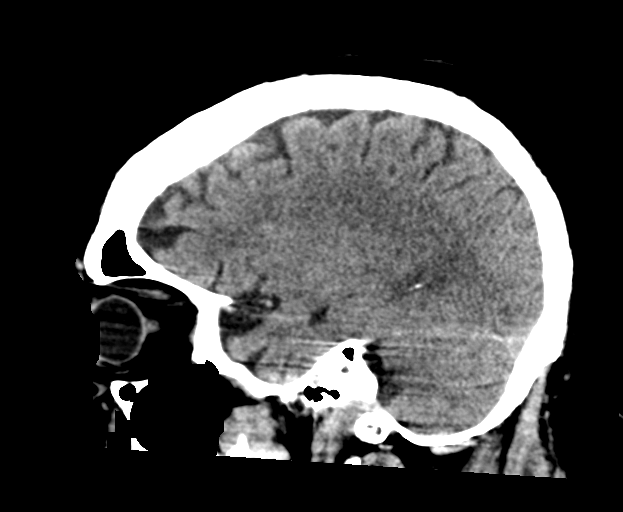
[im 31/61  brain]
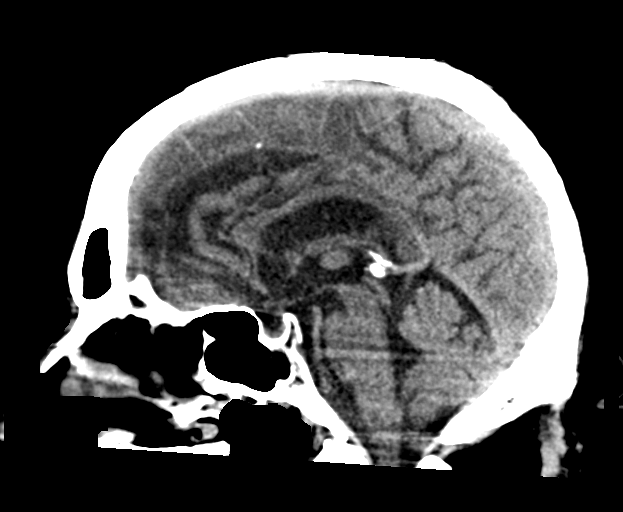
[im 41/61  brain]
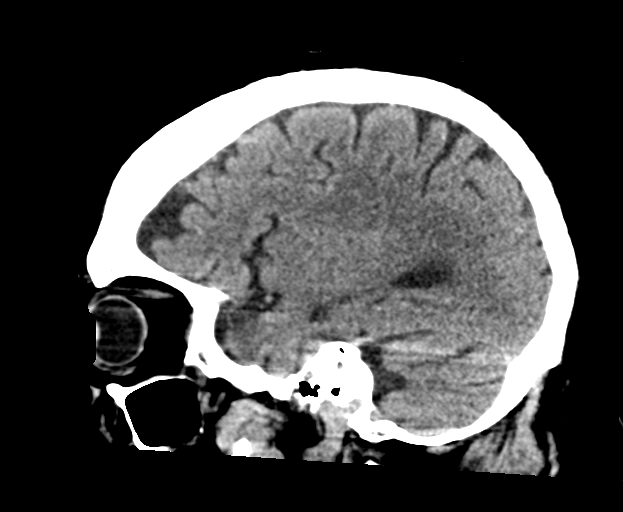

[16 of 47 positions shown; findings below may reference images not displayed]

FINDINGS: Brain: S Diffusely enlarged ventricles and subarachnoid spaces.
Patchy white matter low density in both cerebral hemispheres. No
intracranial hemorrhage, mass lesion or CT evidence of acute
infarction.

Vascular: No hyperdense vessel or unexpected calcification.

Skull: Normal. Negative for fracture or focal lesion.

Sinuses/Orbits: Unremarkable.

Other: None.
IMPRESSION: 1. No acute abnormality.
2. Mild diffuse cerebral and cerebellar atrophy.
3. Minimal chronic small vessel white matter ischemic changes in
both cerebral hemispheres.
